# Patient Record
Sex: Male | Born: 1935 | Race: White | Hispanic: No | Marital: Married | Smoking: Never smoker
Health system: Southern US, Community
[De-identification: ages and names within clinical notes are randomized; demographics above are authoritative.]

## PROBLEM LIST (undated history)

## (undated) ENCOUNTER — Emergency Department (HOSPITAL_COMMUNITY): Discharge status: Against Medical Advice | Payer: Medicare Other

## (undated) DIAGNOSIS — E785 Hyperlipidemia, unspecified: Secondary | ICD-10-CM

## (undated) DIAGNOSIS — K219 Gastro-esophageal reflux disease without esophagitis: Secondary | ICD-10-CM

## (undated) DIAGNOSIS — E039 Hypothyroidism, unspecified: Secondary | ICD-10-CM

## (undated) DIAGNOSIS — I2510 Atherosclerotic heart disease of native coronary artery without angina pectoris: Secondary | ICD-10-CM

## (undated) DIAGNOSIS — M1990 Unspecified osteoarthritis, unspecified site: Secondary | ICD-10-CM

## (undated) DIAGNOSIS — I2119 ST elevation (STEMI) myocardial infarction involving other coronary artery of inferior wall: Secondary | ICD-10-CM

## (undated) DIAGNOSIS — J189 Pneumonia, unspecified organism: Secondary | ICD-10-CM

## (undated) HISTORY — DX: Hypothyroidism, unspecified: E03.9

## (undated) HISTORY — PX: CATARACT EXTRACTION W/ INTRAOCULAR LENS  IMPLANT, BILATERAL: SHX1307

## (undated) HISTORY — PX: TONSILLECTOMY: SUR1361

## (undated) HISTORY — DX: Gastro-esophageal reflux disease without esophagitis: K21.9

## (undated) HISTORY — DX: Atherosclerotic heart disease of native coronary artery without angina pectoris: I25.10

## (undated) HISTORY — DX: Hyperlipidemia, unspecified: E78.5

## (undated) HISTORY — DX: ST elevation (STEMI) myocardial infarction involving other coronary artery of inferior wall: I21.19

---

## 2003-07-24 ENCOUNTER — Ambulatory Visit (HOSPITAL_COMMUNITY): Admission: RE | Admit: 2003-07-24 | Discharge: 2003-07-24 | Payer: Self-pay | Admitting: Gastroenterology

## 2003-07-24 ENCOUNTER — Encounter (INDEPENDENT_AMBULATORY_CARE_PROVIDER_SITE_OTHER): Payer: Self-pay | Admitting: Specialist

## 2005-04-21 ENCOUNTER — Ambulatory Visit (HOSPITAL_COMMUNITY): Admission: RE | Admit: 2005-04-21 | Discharge: 2005-04-21 | Payer: Self-pay | Admitting: Gastroenterology

## 2005-04-21 ENCOUNTER — Encounter (INDEPENDENT_AMBULATORY_CARE_PROVIDER_SITE_OTHER): Payer: Self-pay | Admitting: *Deleted

## 2005-04-21 HISTORY — PX: COLONOSCOPY W/ BIOPSIES AND POLYPECTOMY: SHX1376

## 2008-04-30 DIAGNOSIS — I2119 ST elevation (STEMI) myocardial infarction involving other coronary artery of inferior wall: Secondary | ICD-10-CM

## 2008-04-30 HISTORY — DX: ST elevation (STEMI) myocardial infarction involving other coronary artery of inferior wall: I21.19

## 2008-05-07 ENCOUNTER — Inpatient Hospital Stay (HOSPITAL_COMMUNITY): Admission: EM | Admit: 2008-05-07 | Discharge: 2008-05-09 | Payer: Self-pay | Admitting: Cardiology

## 2008-05-07 HISTORY — PX: CORONARY ANGIOPLASTY WITH STENT PLACEMENT: SHX49

## 2008-05-08 ENCOUNTER — Encounter: Payer: Self-pay | Admitting: Cardiology

## 2008-05-24 ENCOUNTER — Encounter (HOSPITAL_COMMUNITY): Admission: RE | Admit: 2008-05-24 | Discharge: 2008-09-05 | Payer: Self-pay | Admitting: Internal Medicine

## 2008-09-10 ENCOUNTER — Encounter (HOSPITAL_COMMUNITY): Admission: RE | Admit: 2008-09-10 | Discharge: 2008-11-29 | Payer: Self-pay | Admitting: Cardiology

## 2008-11-30 ENCOUNTER — Encounter (HOSPITAL_COMMUNITY): Admission: RE | Admit: 2008-11-30 | Discharge: 2009-02-28 | Payer: Self-pay | Admitting: Cardiology

## 2009-03-01 ENCOUNTER — Encounter (HOSPITAL_COMMUNITY): Admission: RE | Admit: 2009-03-01 | Discharge: 2009-05-30 | Payer: Self-pay | Admitting: Cardiology

## 2009-05-31 ENCOUNTER — Encounter (HOSPITAL_COMMUNITY): Admission: RE | Admit: 2009-05-31 | Discharge: 2009-08-29 | Payer: Self-pay | Admitting: Cardiology

## 2009-08-30 ENCOUNTER — Encounter (HOSPITAL_COMMUNITY): Admission: RE | Admit: 2009-08-30 | Discharge: 2009-11-28 | Payer: Self-pay | Admitting: Cardiology

## 2009-11-30 ENCOUNTER — Encounter (HOSPITAL_COMMUNITY): Admission: RE | Admit: 2009-11-30 | Discharge: 2010-02-28 | Payer: Self-pay | Admitting: Cardiology

## 2010-01-20 DIAGNOSIS — M1990 Unspecified osteoarthritis, unspecified site: Secondary | ICD-10-CM | POA: Insufficient documentation

## 2010-01-20 DIAGNOSIS — D239 Other benign neoplasm of skin, unspecified: Secondary | ICD-10-CM | POA: Insufficient documentation

## 2010-01-20 DIAGNOSIS — I700 Atherosclerosis of aorta: Secondary | ICD-10-CM | POA: Insufficient documentation

## 2010-02-10 DIAGNOSIS — E785 Hyperlipidemia, unspecified: Secondary | ICD-10-CM | POA: Insufficient documentation

## 2010-02-10 DIAGNOSIS — D539 Nutritional anemia, unspecified: Secondary | ICD-10-CM | POA: Insufficient documentation

## 2010-02-10 DIAGNOSIS — R030 Elevated blood-pressure reading, without diagnosis of hypertension: Secondary | ICD-10-CM | POA: Insufficient documentation

## 2010-03-01 ENCOUNTER — Encounter (HOSPITAL_COMMUNITY): Admission: RE | Admit: 2010-03-01 | Discharge: 2010-05-30 | Payer: Self-pay | Admitting: Cardiology

## 2010-05-31 ENCOUNTER — Encounter (HOSPITAL_COMMUNITY): Admission: RE | Admit: 2010-05-31 | Discharge: 2010-08-29 | Payer: Self-pay | Admitting: Cardiology

## 2010-08-30 ENCOUNTER — Encounter (HOSPITAL_COMMUNITY)
Admission: RE | Admit: 2010-08-30 | Discharge: 2010-11-28 | Payer: Self-pay | Source: Home / Self Care | Attending: Cardiology | Admitting: Cardiology

## 2010-12-02 ENCOUNTER — Encounter (HOSPITAL_COMMUNITY)
Admission: RE | Admit: 2010-12-02 | Discharge: 2010-12-30 | Payer: Self-pay | Source: Home / Self Care | Attending: Cardiology | Admitting: Cardiology

## 2010-12-22 ENCOUNTER — Ambulatory Visit: Payer: Self-pay | Admitting: Cardiology

## 2010-12-31 ENCOUNTER — Ambulatory Visit (HOSPITAL_COMMUNITY): Payer: Self-pay

## 2011-01-02 ENCOUNTER — Encounter (HOSPITAL_COMMUNITY): Payer: Self-pay | Attending: Cardiology

## 2011-01-02 DIAGNOSIS — Z7982 Long term (current) use of aspirin: Secondary | ICD-10-CM | POA: Insufficient documentation

## 2011-01-02 DIAGNOSIS — Z5189 Encounter for other specified aftercare: Secondary | ICD-10-CM | POA: Insufficient documentation

## 2011-01-02 DIAGNOSIS — K219 Gastro-esophageal reflux disease without esophagitis: Secondary | ICD-10-CM | POA: Insufficient documentation

## 2011-01-02 DIAGNOSIS — I2510 Atherosclerotic heart disease of native coronary artery without angina pectoris: Secondary | ICD-10-CM | POA: Insufficient documentation

## 2011-01-02 DIAGNOSIS — E785 Hyperlipidemia, unspecified: Secondary | ICD-10-CM | POA: Insufficient documentation

## 2011-01-02 DIAGNOSIS — E039 Hypothyroidism, unspecified: Secondary | ICD-10-CM | POA: Insufficient documentation

## 2011-01-02 DIAGNOSIS — I252 Old myocardial infarction: Secondary | ICD-10-CM | POA: Insufficient documentation

## 2011-01-02 DIAGNOSIS — Z9861 Coronary angioplasty status: Secondary | ICD-10-CM | POA: Insufficient documentation

## 2011-01-02 DIAGNOSIS — I2582 Chronic total occlusion of coronary artery: Secondary | ICD-10-CM | POA: Insufficient documentation

## 2011-01-05 ENCOUNTER — Encounter (HOSPITAL_COMMUNITY): Payer: Self-pay

## 2011-01-07 ENCOUNTER — Encounter (HOSPITAL_COMMUNITY): Payer: Self-pay

## 2011-01-09 ENCOUNTER — Encounter (HOSPITAL_COMMUNITY): Payer: Self-pay

## 2011-01-12 ENCOUNTER — Encounter (HOSPITAL_COMMUNITY): Payer: Self-pay

## 2011-01-14 ENCOUNTER — Encounter (HOSPITAL_COMMUNITY): Payer: Self-pay

## 2011-01-16 ENCOUNTER — Encounter (HOSPITAL_COMMUNITY): Payer: Self-pay

## 2011-01-19 ENCOUNTER — Encounter (HOSPITAL_COMMUNITY): Payer: Self-pay

## 2011-01-21 ENCOUNTER — Encounter (HOSPITAL_COMMUNITY): Payer: Self-pay

## 2011-01-23 ENCOUNTER — Encounter (HOSPITAL_COMMUNITY): Payer: Self-pay

## 2011-01-26 ENCOUNTER — Encounter (HOSPITAL_COMMUNITY): Payer: Self-pay

## 2011-01-28 ENCOUNTER — Encounter (HOSPITAL_COMMUNITY): Payer: Self-pay

## 2011-01-30 ENCOUNTER — Encounter (HOSPITAL_COMMUNITY): Payer: Self-pay

## 2011-02-02 ENCOUNTER — Encounter (HOSPITAL_COMMUNITY): Payer: Self-pay | Attending: Cardiology

## 2011-02-02 DIAGNOSIS — I2582 Chronic total occlusion of coronary artery: Secondary | ICD-10-CM | POA: Insufficient documentation

## 2011-02-02 DIAGNOSIS — K219 Gastro-esophageal reflux disease without esophagitis: Secondary | ICD-10-CM | POA: Insufficient documentation

## 2011-02-02 DIAGNOSIS — Z9861 Coronary angioplasty status: Secondary | ICD-10-CM | POA: Insufficient documentation

## 2011-02-02 DIAGNOSIS — E039 Hypothyroidism, unspecified: Secondary | ICD-10-CM | POA: Insufficient documentation

## 2011-02-02 DIAGNOSIS — E785 Hyperlipidemia, unspecified: Secondary | ICD-10-CM | POA: Insufficient documentation

## 2011-02-02 DIAGNOSIS — I2510 Atherosclerotic heart disease of native coronary artery without angina pectoris: Secondary | ICD-10-CM | POA: Insufficient documentation

## 2011-02-02 DIAGNOSIS — Z7982 Long term (current) use of aspirin: Secondary | ICD-10-CM | POA: Insufficient documentation

## 2011-02-02 DIAGNOSIS — Z5189 Encounter for other specified aftercare: Secondary | ICD-10-CM | POA: Insufficient documentation

## 2011-02-02 DIAGNOSIS — I252 Old myocardial infarction: Secondary | ICD-10-CM | POA: Insufficient documentation

## 2011-02-04 ENCOUNTER — Encounter (HOSPITAL_COMMUNITY): Payer: Self-pay

## 2011-02-06 ENCOUNTER — Encounter (HOSPITAL_COMMUNITY): Payer: Self-pay

## 2011-02-09 ENCOUNTER — Encounter (HOSPITAL_COMMUNITY): Payer: Self-pay

## 2011-02-11 ENCOUNTER — Encounter (HOSPITAL_COMMUNITY): Payer: Self-pay

## 2011-02-13 ENCOUNTER — Encounter (HOSPITAL_COMMUNITY): Payer: Self-pay

## 2011-02-16 ENCOUNTER — Encounter (HOSPITAL_COMMUNITY): Payer: Self-pay

## 2011-02-18 ENCOUNTER — Encounter (HOSPITAL_COMMUNITY): Payer: Self-pay

## 2011-02-20 ENCOUNTER — Encounter (HOSPITAL_COMMUNITY): Payer: Self-pay

## 2011-02-23 ENCOUNTER — Encounter (HOSPITAL_COMMUNITY): Payer: Self-pay

## 2011-02-25 ENCOUNTER — Encounter (HOSPITAL_COMMUNITY): Payer: Self-pay

## 2011-02-27 ENCOUNTER — Encounter (HOSPITAL_COMMUNITY): Payer: Self-pay

## 2011-03-02 ENCOUNTER — Encounter (HOSPITAL_COMMUNITY): Payer: Self-pay | Attending: Cardiology

## 2011-03-02 DIAGNOSIS — K219 Gastro-esophageal reflux disease without esophagitis: Secondary | ICD-10-CM | POA: Insufficient documentation

## 2011-03-02 DIAGNOSIS — Z7982 Long term (current) use of aspirin: Secondary | ICD-10-CM | POA: Insufficient documentation

## 2011-03-02 DIAGNOSIS — E785 Hyperlipidemia, unspecified: Secondary | ICD-10-CM | POA: Insufficient documentation

## 2011-03-02 DIAGNOSIS — Z5189 Encounter for other specified aftercare: Secondary | ICD-10-CM | POA: Insufficient documentation

## 2011-03-02 DIAGNOSIS — I2510 Atherosclerotic heart disease of native coronary artery without angina pectoris: Secondary | ICD-10-CM | POA: Insufficient documentation

## 2011-03-02 DIAGNOSIS — Z9861 Coronary angioplasty status: Secondary | ICD-10-CM | POA: Insufficient documentation

## 2011-03-02 DIAGNOSIS — E039 Hypothyroidism, unspecified: Secondary | ICD-10-CM | POA: Insufficient documentation

## 2011-03-02 DIAGNOSIS — I2582 Chronic total occlusion of coronary artery: Secondary | ICD-10-CM | POA: Insufficient documentation

## 2011-03-02 DIAGNOSIS — I252 Old myocardial infarction: Secondary | ICD-10-CM | POA: Insufficient documentation

## 2011-03-04 ENCOUNTER — Encounter (HOSPITAL_COMMUNITY): Payer: Self-pay

## 2011-03-06 ENCOUNTER — Encounter (HOSPITAL_COMMUNITY): Payer: Self-pay

## 2011-03-09 ENCOUNTER — Encounter (HOSPITAL_COMMUNITY): Payer: Self-pay

## 2011-03-11 ENCOUNTER — Encounter (HOSPITAL_COMMUNITY): Payer: Self-pay

## 2011-03-13 ENCOUNTER — Encounter (HOSPITAL_COMMUNITY): Payer: Self-pay

## 2011-03-16 ENCOUNTER — Encounter (HOSPITAL_COMMUNITY): Payer: Self-pay

## 2011-03-18 ENCOUNTER — Encounter (HOSPITAL_COMMUNITY): Payer: Self-pay

## 2011-03-20 ENCOUNTER — Encounter (HOSPITAL_COMMUNITY): Payer: Self-pay

## 2011-03-23 ENCOUNTER — Encounter (HOSPITAL_COMMUNITY): Payer: Self-pay

## 2011-03-25 ENCOUNTER — Encounter (HOSPITAL_COMMUNITY): Payer: Self-pay

## 2011-03-27 ENCOUNTER — Encounter (HOSPITAL_COMMUNITY): Payer: Self-pay

## 2011-03-30 ENCOUNTER — Encounter (HOSPITAL_COMMUNITY): Payer: Self-pay

## 2011-04-01 ENCOUNTER — Encounter (HOSPITAL_COMMUNITY): Payer: Self-pay

## 2011-04-03 ENCOUNTER — Encounter (HOSPITAL_COMMUNITY): Payer: Self-pay

## 2011-04-06 ENCOUNTER — Encounter (HOSPITAL_COMMUNITY): Payer: Self-pay

## 2011-04-08 ENCOUNTER — Encounter (HOSPITAL_COMMUNITY): Payer: Self-pay | Attending: Cardiology

## 2011-04-08 DIAGNOSIS — I2510 Atherosclerotic heart disease of native coronary artery without angina pectoris: Secondary | ICD-10-CM | POA: Insufficient documentation

## 2011-04-08 DIAGNOSIS — I252 Old myocardial infarction: Secondary | ICD-10-CM | POA: Insufficient documentation

## 2011-04-08 DIAGNOSIS — Z9861 Coronary angioplasty status: Secondary | ICD-10-CM | POA: Insufficient documentation

## 2011-04-08 DIAGNOSIS — I2582 Chronic total occlusion of coronary artery: Secondary | ICD-10-CM | POA: Insufficient documentation

## 2011-04-08 DIAGNOSIS — Z5189 Encounter for other specified aftercare: Secondary | ICD-10-CM | POA: Insufficient documentation

## 2011-04-08 DIAGNOSIS — Z7982 Long term (current) use of aspirin: Secondary | ICD-10-CM | POA: Insufficient documentation

## 2011-04-08 DIAGNOSIS — E785 Hyperlipidemia, unspecified: Secondary | ICD-10-CM | POA: Insufficient documentation

## 2011-04-08 DIAGNOSIS — E039 Hypothyroidism, unspecified: Secondary | ICD-10-CM | POA: Insufficient documentation

## 2011-04-08 DIAGNOSIS — K219 Gastro-esophageal reflux disease without esophagitis: Secondary | ICD-10-CM | POA: Insufficient documentation

## 2011-04-10 ENCOUNTER — Encounter (HOSPITAL_COMMUNITY): Payer: Self-pay

## 2011-04-13 ENCOUNTER — Encounter (HOSPITAL_COMMUNITY): Payer: Self-pay

## 2011-04-14 NOTE — H&P (Signed)
NAME:  SAVON, BORDONARO NO.:  [id number]   MEDICAL RECORD NO.:  [id number]          PATIENT TYPE:  INP   LOCATION:  2901                         FACILITY:  [facility]   PHYSICIAN:  Peter M. Swaziland, M.D.  DATE OF BIRTH:  03-19-1930   DATE OF ADMISSION:  05/07/2008  DATE OF DISCHARGE:                              HISTORY & PHYSICAL   HISTORY OF PRESENT ILLNESS:  Mr. Dalto is a pleasant 77 year old  white male, previously healthy.  He did experience an episode of  significant chest pain and indigestion approximately 2 weeks ago.  This  was initially felt to be related to him taking Celebrex.  Celebrex was  discontinued and he was placed on Prilosec and had resolution of his  symptoms.  He subsequently did have an ECG by Dr. Timothy Lasso, which showed  new ST depression in leads V2 and V3 compared with an old ECG.  He  remained asymptomatic and was referred for a stress Cardiolite study  this morning.  On his stress test, the patient was able to walk for 7  minutes and 30 seconds on the Bruce protocol.  He had no chest pain, but  did complain of dyspnea.  During recovery, he dropped his blood pressure  to 110/40 and he subsequently developed acute ST-elevation in the  inferior leads, in leads II, III, aVF, and V6.  This did not respond to  sublingual nitroglycerin.  A code STEMI was called at this point and EMS  was called for emergency transportation.  Again, the patient still do  not have any chest pain and really felt quite well.  On subsequent  review of his resting Cardiolite images, he did have a moderate-sized  inferolateral wall defect.   PAST MEDICAL HISTORY:  1. Hypothyroidism.  2. GERD.   PAST SURGICAL HISTORY:  He has no prior surgeries.   ALLERGIES:  He has had no known allergies.   MEDICATIONS:  1. Synthroid 112 mcg per day.  2. Prilosec daily.   SOCIAL HISTORY:  The patient works in Statistician room.  He  is married, and has children and  grandchildren.  He denies tobacco or  alcohol use.   FAMILY HISTORY:  His parents had no history of heart disease, but he has  a son who has had 8 coronary stents.   REVIEW OF SYSTEMS:  Otherwise unremarkable.   PHYSICAL EXAMINATION:  GENERAL:  The patient is a well-developed white  male, in no distress.  VITAL SIGNS:  Blood pressure 113/71, pulse is 90 and regular.  His  respirations were normal.  HEENT:  Normocephalic and atraumatic.  Pupils equal, round, and reactive  to light and accommodation.  Extraocular movements are full.  Oropharynx  is clear.  NECK:  Without JVD, adenopathy, thyromegaly, or bruits.  LUNGS:  Clear.  CARDIAC:  Regular rate and rhythm without murmur, rub, gallop, or click.  ABDOMEN:  Soft and nontender without masses or hepatosplenomegaly.  Femoral pedal pulses are 2+ and symmetric.  He has no edema.  NEUROLOGIC:  Intact.   LABORATORY DATA:  ECG shows acute inferior wall injury pattern  with  reciprocal ST depression anteriorly.   IMPRESSION:  1. Acute inferior myocardial infarction.  2. Hypothyroidism.   PLAN:  The patient is treated in the office with 4 chewable aspirin and  sublingual nitroglycerin.  Started on oxygen therapy.  He will be  transferred directly to the cardiac catheterization laboratory for  emergent cardiac catheterization and possible PCI.           ______________________________  Peter M. Swaziland, M.D.     PMJ/MEDQ  D:  05/07/2008  T:  05/08/2008  Job:  [id number]   cc:   Gwen Pounds, MD

## 2011-04-14 NOTE — Cardiovascular Report (Signed)
NAME:  Joseph Roman, Joseph Roman NO.:  [id number]   MEDICAL RECORD NO.:  [id number]          PATIENT TYPE:  INP   LOCATION:  2901                         FACILITY:  [facility]   PHYSICIAN:  Peter M. Swaziland, M.D.  DATE OF BIRTH:  Jul 24, 1930   DATE OF PROCEDURE:  05/07/2008  DATE OF DISCHARGE:                            CARDIAC CATHETERIZATION   INDICATION FOR PROCEDURE:  The patient is a 76 year old white male who  had an episode of significant chest pain 2 weeks ago that has since  resolved.  He was referred for a stress Cardiolite study.  On stress  testing, the patient developed significant ST elevation in the inferior  leads that did not resolve with rest.  He had no symptoms of chest pain,  but did complain of some shortness of breath.  Because of his persistent  acute inferior injury pattern, he was brought to the cath lab for  emergent intervention.   PROCEDURE:  1. Left heart catheterization, coronary.  2. Left ventricular angiography accessed via the right femoral artery      using standard Seldinger technique.   EQUIPMENTS USED:  1. A 6-French 4-cm right and left Judkins catheter.  2. A 6-French pigtail catheter.  3. A 6-French CLS.  4. A 3.5 Runway guide.  5. A Miracle Brothers 4.5 wire.  6. Asahi medium wire.  7. A 2.5 x 15-mm apex balloon.  8. A 2.75 x 23-mm Palmaz stent.  9. A 2.75 x 20-mm Quantum Maverick balloon.   HEMODYNAMIC DATA:  Aortic pressure was 161 with a mean of 79 mmHg.  Left  ventricular pressure was 106 with EDP of 15 mmHg.   MEDICATIONS:  1. Heparin bolus of 3800 units with subsequent ACT of 247.  2. Integrilin double bolus of 180 mcg/kg IV followed by continuous      infusion of 2 mcg/kg per minute.  3. Nitroglycerin 200 mcg intracoronary x2.  4. Plavix 600 mg orally x1.   CONTRAST:  275 mL of Omnipaque.   ANGIOGRAPHIC DATA:  The left coronary artery arises and distributes  normally.  The left main coronary artery appears normal.   Left anterior descending artery has 30% stenosis proximally.  There is  diffuse 20% disease throughout the mid-LAD.  The first diagonal also has  20% disease.   The left circumflex coronary is totally occluded immediately after the  first obtuse marginal vessel.  There is TIMI grade 0 flow.   The right coronary artery arises and distributes normally.  It is a  dominant vessel.  It has scattered disease up to 20% throughout the  proximal midvessel.  No collaterals were seen to the circumflex artery.   We proceeded at this point with emergent intervention of the left  circumflex coronary artery.  Using the above equipment, we did find it  quite difficult to cross the total occlusion of the midcircumflex.  He  started with an Clinical cytogeneticist and also tried a Chief Technology Officer and a Ryder System, all without success.  We were able to finally cross with a Miracle  Brothers 4.5-mm wire.  We then predilated the  lesion using 2.5-mm apex  balloon up to 6 and then 8 atmospheres x2.  This yielded good  reperfusion and there was a segmental stenosis in the vessel.  TIMI  grade 3 flow was restored.  Because of difficulty visualizing the vessel  with the Miracle Brothers wire, we did exchange for an [employer]  wire.  We next proceeded with stenting of the lesion using 2.75 x 23-mm  Palmaz stent deploying this at 8 and then 10 atmospheres with the stent  balloon.  This did result in some compromise of the first obtuse  marginal vessel at the origin.  We were able to keep the medium wire  down the distal left circumflex and cross the obtuse marginal vessel  with the Miracle Brothers wire.  We performed 1 balloon inflation at the  ostium of this vessel using the 2.5-mm apex balloon up to 6 atmospheres.  This yielded good angiographic result of the obtuse marginal vessel.  We  then postdilated the stented segment using a 2.75 x 20-mm Quantum  Maverick balloon up to 14 atmospheres.  This yielded an  excellent  angiographic result with 0% stenosis in the mid-left circumflex coronary  artery, TIMI grade 3 flow.  There was still approximately 20-30%  narrowing in the ostium of the first obtuse marginal vessel that also  had excellent flow.   Left ventricular angiography was performed in the RAO view.  This  demonstrates normal left ventricular size.  There was a very tiny left  ventricular diverticulum on the inferior wall, but no significant wall  motion or abnormalities were seen in this view.  Ejection fraction was  estimated at 60%.   FINAL INTERPRETATION:  1. Single-vessel occlusive atherosclerotic coronary artery disease      involving the mid-left circumflex coronary artery.  2. Good left ventricular function.  3. Successful intracoronary stenting of the mid-left circumflex      coronary artery with a drug-eluting stent.           ______________________________  Peter M. Swaziland, M.D.     PMJ/MEDQ  D:  05/07/2008  T:  05/08/2008  Job:  [id number]   cc:   Othelia Pulling, M.D.

## 2011-04-14 NOTE — Discharge Summary (Signed)
NAME:  Joseph Roman, Joseph Roman NO.:  [id number]   MEDICAL RECORD NO.:  [id number]          PATIENT TYPE:  INP   LOCATION:  2001                         FACILITY:  [facility]   PHYSICIAN:  Peter M. Swaziland, M.D.  DATE OF BIRTH:  January 08, 1930   DATE OF ADMISSION:  05/07/2008  DATE OF DISCHARGE:  05/09/2008                               DISCHARGE SUMMARY   HISTORY OF PRESENT ILLNESS:  Joseph Roman is a pleasant 77 year old  white male with a history of hypothyroidism who presented for evaluation  after an episode of chest pain approximately 2 weeks prior, this was  initially felt to be related to taking Celebrex and this was  discontinued.  He was placed on Prilosec with resolution of his  symptoms.  Subsequent ECG showed new ST-segment depression in leads V2  and V3, and he was referred for stress Cardiolite study.  On his stress  test, the patient was able to walk 7 minutes and 30 seconds.  He denied  any chest pain, but did developed dyspnea.  During recovery, he  developed acute ST elevation in inferior leads; leads II, III, aVF and  V6 consistent with acute injury, these ST-segment changes persisted, and  the patient did become mildly hypotensive.  His ST-segment elevation did  not resolve despite sublingual nitroglycerin and he was admitted  emergently for acute intervention.  For details of his past medical  history, social history, family history, and physical exam please see  admission history and physical.   LABORATORY DATA:  As noted on admission, his ECG showed ST-segment  depression in leads V2 and V3.  There was new ST-segment elevation in  the inferior leads; leads II, III, aVF, and V6.   ADDITIONAL LABORATORY DATA:  His hemoglobin is 15.1, hematocrit 42.9,  white count is 11,600, platelets 329,000, sodium 136, potassium 3.9,  chloride 103, CO2 22, BUN 21, creatinine 1.13, and glucose 128.  Coags  were normal.  Liver function studies were normal except for total  bili  of 1.3.  Albumin is 3.2.  Serial cardiac enzymes showed troponins of 5.9  and 13.57 and 8.9 and then 7.61.  CPK-MBs were 101 with 4.5 MB, then 123  with 5.1 MB, then 113 with 5.1 MB, and then 93 with 3.7 MB.  Cholesterol  was 102, LDL was 67, HDL 21, triglycerides 71, calcium was 8.5, TSH was  2.084, and A1c was 5.6%.  Chest x-ray showed no active disease with  minimal basilar atelectasis.   HOSPITAL COURSE:  The patient was taken directly to the cardiac  catheterization laboratory.  He underwent emergent cardiac  catheterization, this demonstrated mild nonobstructive disease in the  LAD and right coronary distribution.  The left circumflex coronary was  occluded after the first obtuse marginal vessel.  There were no  significant collaterals to this vessel.  Left ventricular function,  overall, appeared normal with trivial inferior wall diverticulum.  We  proceeded at this point with acute intervention of the mid circumflex.  The patient was heparinized and placed on IV Integrilin. He received 600  mg of Plavix p.o.  The lesion is  circumflex, it is actually quite  difficult to cross, but we were able to cross the lesion with a Miracle  Brothers wire and this lesion was successfully stented using a 2.75 x 23  mm PROMUS drug-eluting stent.  We also dilated the first obtuse marginal  vessel with a balloon at the ostium, as there was some plaque shift into  the margin of the obtuse marginal vessel.  This yielded an excellent  angiographic result with 0% residual stenosis and TIMI grade 3 flow.  Following the procedure, the patient's ST segments normalized.  His  cardiac enzymes were elevated as noted.  We did reviewed his resting  Cardiolite images which showed a inferolateral wall defect and given the  time course of events, it was felt that the patient probably had a  subtotal occlusion of the circumflex 2 weeks prior, and then completed  his infarct the day of admission, the patient  did well.   FOLLOWUP PROCEDURE:  He was kept on IV Integrilin for 18 hours.  He had  no recurrent chest pain.  He had no groin complications.  His ECG  normalized.  He was progressively ambulated.  He was started on low-dose  Toprol-XL as well as statin therapy.  He was seen by cardiac rehab and  progressively ambulated without difficulty, and was discharged home in  stable condition on May 09, 2008.   DISCHARGE DIAGNOSES:  1. Acute inferolateral myocardial infarction.  2. Coronary artery disease, status post successful stenting of the mid      circumflex coronary artery.  3. Hypothyroidism.  4. Mild dyslipidemia.   DISCHARGE MEDICATIONS:  1. Coated aspirin 325 mg per day.  2. Plavix 75 mg per day.  3. Toprol-XL 25 mg per day.  4. Crestor 10 mg per day.  5. Prilosec 20 mg per day.  6. Synthroid 112 mcg daily.  7. Nitroglycerin sublingual p.r.n.   The patient will follow up with Dr. Swaziland in 2 weeks.  Discharge status  is improved.           ______________________________  Peter M. Swaziland, M.D.     PMJ/MEDQ  D:  05/09/2008  T:  05/09/2008  Job:  [id number]   cc:   Joseph Pounds, MD

## 2011-04-15 ENCOUNTER — Encounter (HOSPITAL_COMMUNITY): Payer: Self-pay

## 2011-04-17 ENCOUNTER — Encounter (HOSPITAL_COMMUNITY): Payer: Self-pay

## 2011-04-17 NOTE — Op Note (Signed)
NAME:  Joseph Roman, Joseph Roman NO.:  [id number]   MEDICAL RECORD NO.:  [id number]          PATIENT TYPE:  AMB   LOCATION:  ENDO                         FACILITY:  [facility]   PHYSICIAN:  Petra Kuba, M.D.    DATE OF BIRTH:  Dec 17, 1929   DATE OF PROCEDURE:  04/21/2005  DATE OF DISCHARGE:                                 OPERATIVE REPORT   PROCEDURE:  Colonoscopy with polypectomy.   INDICATIONS FOR PROCEDURE:  History of colon polyps due for repeat  screening.   Consent was signed after risks, benefits, methods, and options were  thoroughly discussed in the office in the past.   MEDICATIONS:  Demerol 60, Versed 6.   DESCRIPTION OF PROCEDURE:  Rectal inspection was pertinent for small  external hemorrhoids, digital exam was negative. The video colonoscope was  inserted and much easier than last time advanced to the level of the  ileocecal valve. At that point, the scope began to loop and with abdominal  pressure was able to advance to the cecal pole which was identified by the  appendiceal orifice and the ileocecal valve. On insertion, some left sided  scattered diverticula were seen but no other abnormalities. The prep was  adequate. There was minimal stool that required washing and suctioning. We  did wash and suction the ascending colon very well but other than a tiny  questionable polyp on a fold in the hepatic flexure which was hot biopsied,  no other abnormal folds or polyps were seen. The main reason for doing this  colonoscopy was to reevaluate this fold which had an adenomatous biopsy on  the previous colonoscopy. The scope was then slowly withdrawn. No additional  polypoid lesions were seen as we slowly withdrew back to the rectum. There  was a left occasional diverticula mentioned above. Once back in the rectum,  anal rectal pullthrough and retroflexion confirmed some small hemorrhoids.  The scope was straightened and readvanced a short ways up the left side of  the colon, air was suctioned, scope removed. The patient tolerated the  procedure well. There was no obvious or immediate complication.   ENDOSCOPIC DIAGNOSIS:  1.  Internal and external hemorrhoids.  2.  Left occasional diverticula.  3.  Questionable tiny hepatic flexure polyp hot biopsied.  4.  Otherwise within normal limits to the cecum.   PLAN:  Await pathology, probably recheck colon screening in five years if  doing well medically otherwise return care to Dr. Timothy Lasso for the customary  health care maintenance to include yearly rectal's and guaiac's.      MEM/MEDQ  D:  04/21/2005  T:  04/21/2005  Job:  [id number]   cc:   Gwen Pounds, MD  Fax: [phone]

## 2011-04-17 NOTE — Op Note (Signed)
NAME:  Joseph Roman, Joseph Roman                      ACCOUNT NO.:  [id number]   MEDICAL RECORD NO.:  [id number]                   PATIENT TYPE:  AMB   LOCATION:  ENDO                                 [facility]AC[facility]   PHYSICIAN:  Petra Kuba, M.D.                 DATE OF BIRTH:  May 25, 1930   DATE OF PROCEDURE:  07/24/2003  DATE OF DISCHARGE:                                 OPERATIVE REPORT   PROCEDURE:  Colonoscopy with hot biopsy.   INDICATIONS FOR PROCEDURE:  Screening.   Consent was signed after risks, benefits, methods, and options were  thoroughly discussed in the office.   MEDICINES USED:  Demerol 50, Versed 5.   DESCRIPTION OF PROCEDURE:  Rectal inspection was pertinent for external  hemorrhoids, small. Digital exam was negative. The video colonoscope was  inserted and with some difficulty due to a long looping colon using the  regular scope with rolling him on his back and some abdominal pressure was  able to advance to the cecum. Other than some occasional left sided  diverticula, no abnormalities were seen on insertion.  The cecum was  identified by the appendiceal orifice and the ileocecal valve. The prep was  adequate. There was some liquid stool that required washing and suctioning.  On slow withdrawal through the colon in the mid ascending along a fold was a  questionable polyp which was hot biopsied x3 and put in the first container.  Two other questionable polyps were seen in the mid transverse and mid  descending which were hot biopsied and put in the same container. Other than  the occasional left sided diverticula, no additional findings were seen as  we slowly withdrew back to the rectum. Anal rectal pull through and  retroflexion confirmed some more hemorrhoids. The scope was reinserted a  short ways up the left side of the colon, air was suctioned scope removed.  The patient tolerated the procedure well. There was no obvious or immediate  complications.   ENDOSCOPIC DIAGNOSIS:  1. Internal and external hemorrhoids.  2. Tortous long looping colon.  3. Left occasional diverticula.  4. Two tiny to small questionable polyps hot biopsied in the descending and     transverse.  5. Abnormal fold in the ascending. Questionable polypoid hot biopsied.  6. Otherwise within normal limits to the cecum.    PLAN:  Await pathology to determine future colonic screening. Happy to see  back p.r.n. otherwise return care to Dr. Timothy Lasso for the customary health care  maintenance to include yearly rectals and guaiacs.                                               Petra Kuba, M.D.    MEM/MEDQ  D:  07/24/2003  T:  07/24/2003  Job:  [id number]   cc:   Gwen Pounds, M.D.  [address]  [place]  [place] [address]  Fax: [phone]

## 2011-04-20 ENCOUNTER — Encounter (HOSPITAL_COMMUNITY): Payer: Self-pay

## 2011-04-22 ENCOUNTER — Encounter (HOSPITAL_COMMUNITY): Payer: Self-pay

## 2011-04-24 ENCOUNTER — Encounter (HOSPITAL_COMMUNITY): Payer: Self-pay

## 2011-04-27 ENCOUNTER — Encounter (HOSPITAL_COMMUNITY): Payer: Self-pay

## 2011-04-29 ENCOUNTER — Encounter (HOSPITAL_COMMUNITY): Payer: Self-pay

## 2011-05-01 ENCOUNTER — Encounter (HOSPITAL_COMMUNITY): Payer: Self-pay | Attending: Cardiology

## 2011-05-01 DIAGNOSIS — E785 Hyperlipidemia, unspecified: Secondary | ICD-10-CM | POA: Insufficient documentation

## 2011-05-01 DIAGNOSIS — Z5189 Encounter for other specified aftercare: Secondary | ICD-10-CM | POA: Insufficient documentation

## 2011-05-01 DIAGNOSIS — K219 Gastro-esophageal reflux disease without esophagitis: Secondary | ICD-10-CM | POA: Insufficient documentation

## 2011-05-01 DIAGNOSIS — I2510 Atherosclerotic heart disease of native coronary artery without angina pectoris: Secondary | ICD-10-CM | POA: Insufficient documentation

## 2011-05-01 DIAGNOSIS — Z9861 Coronary angioplasty status: Secondary | ICD-10-CM | POA: Insufficient documentation

## 2011-05-01 DIAGNOSIS — I2582 Chronic total occlusion of coronary artery: Secondary | ICD-10-CM | POA: Insufficient documentation

## 2011-05-01 DIAGNOSIS — Z7982 Long term (current) use of aspirin: Secondary | ICD-10-CM | POA: Insufficient documentation

## 2011-05-01 DIAGNOSIS — E039 Hypothyroidism, unspecified: Secondary | ICD-10-CM | POA: Insufficient documentation

## 2011-05-01 DIAGNOSIS — I252 Old myocardial infarction: Secondary | ICD-10-CM | POA: Insufficient documentation

## 2011-05-04 ENCOUNTER — Encounter (HOSPITAL_COMMUNITY): Payer: Self-pay

## 2011-05-04 ENCOUNTER — Other Ambulatory Visit: Payer: Self-pay | Admitting: *Deleted

## 2011-05-04 MED ORDER — CLOPIDOGREL BISULFATE 75 MG PO TABS: 75.0000 mg | ORAL_TABLET | Freq: Every day | ORAL | Status: DC

## 2011-05-04 NOTE — Telephone Encounter (Signed)
escribe medication per fax request  

## 2011-05-06 ENCOUNTER — Encounter (HOSPITAL_COMMUNITY): Payer: Self-pay

## 2011-05-08 ENCOUNTER — Encounter (HOSPITAL_COMMUNITY): Payer: Self-pay

## 2011-05-11 ENCOUNTER — Encounter (HOSPITAL_COMMUNITY): Payer: Self-pay

## 2011-05-13 ENCOUNTER — Encounter (HOSPITAL_COMMUNITY): Payer: Self-pay

## 2011-05-14 ENCOUNTER — Other Ambulatory Visit: Payer: Self-pay | Admitting: *Deleted

## 2011-05-14 DIAGNOSIS — E7800 Pure hypercholesterolemia, unspecified: Secondary | ICD-10-CM

## 2011-05-14 MED ORDER — ROSUVASTATIN CALCIUM 10 MG PO TABS: 10.0000 mg | ORAL_TABLET | Freq: Every day | ORAL | Status: DC

## 2011-05-14 NOTE — Telephone Encounter (Signed)
Faxed refill for crestor to [facility]

## 2011-05-15 ENCOUNTER — Encounter (HOSPITAL_COMMUNITY): Payer: Self-pay

## 2011-05-18 ENCOUNTER — Encounter (HOSPITAL_COMMUNITY): Payer: Self-pay

## 2011-05-20 ENCOUNTER — Encounter (HOSPITAL_COMMUNITY): Payer: Self-pay

## 2011-05-22 ENCOUNTER — Encounter (HOSPITAL_COMMUNITY): Payer: Self-pay

## 2011-05-25 ENCOUNTER — Encounter (HOSPITAL_COMMUNITY): Payer: Self-pay

## 2011-05-27 ENCOUNTER — Encounter (HOSPITAL_COMMUNITY): Payer: Self-pay

## 2011-05-29 ENCOUNTER — Encounter (HOSPITAL_COMMUNITY): Payer: Self-pay

## 2011-06-01 ENCOUNTER — Encounter (HOSPITAL_COMMUNITY): Payer: Self-pay | Attending: Cardiology

## 2011-06-01 DIAGNOSIS — Z9861 Coronary angioplasty status: Secondary | ICD-10-CM | POA: Insufficient documentation

## 2011-06-01 DIAGNOSIS — Z5189 Encounter for other specified aftercare: Secondary | ICD-10-CM | POA: Insufficient documentation

## 2011-06-01 DIAGNOSIS — Z7982 Long term (current) use of aspirin: Secondary | ICD-10-CM | POA: Insufficient documentation

## 2011-06-01 DIAGNOSIS — E785 Hyperlipidemia, unspecified: Secondary | ICD-10-CM | POA: Insufficient documentation

## 2011-06-01 DIAGNOSIS — I2582 Chronic total occlusion of coronary artery: Secondary | ICD-10-CM | POA: Insufficient documentation

## 2011-06-01 DIAGNOSIS — I252 Old myocardial infarction: Secondary | ICD-10-CM | POA: Insufficient documentation

## 2011-06-01 DIAGNOSIS — K219 Gastro-esophageal reflux disease without esophagitis: Secondary | ICD-10-CM | POA: Insufficient documentation

## 2011-06-01 DIAGNOSIS — I2510 Atherosclerotic heart disease of native coronary artery without angina pectoris: Secondary | ICD-10-CM | POA: Insufficient documentation

## 2011-06-01 DIAGNOSIS — E039 Hypothyroidism, unspecified: Secondary | ICD-10-CM | POA: Insufficient documentation

## 2011-06-03 ENCOUNTER — Encounter (HOSPITAL_COMMUNITY): Payer: Self-pay

## 2011-06-05 ENCOUNTER — Encounter (HOSPITAL_COMMUNITY): Payer: Self-pay

## 2011-06-08 ENCOUNTER — Encounter (HOSPITAL_COMMUNITY): Payer: Self-pay

## 2011-06-10 ENCOUNTER — Encounter (HOSPITAL_COMMUNITY): Payer: Self-pay

## 2011-06-12 ENCOUNTER — Encounter (HOSPITAL_COMMUNITY): Payer: Self-pay

## 2011-06-15 ENCOUNTER — Encounter (HOSPITAL_COMMUNITY): Payer: Self-pay

## 2011-06-17 ENCOUNTER — Encounter (HOSPITAL_COMMUNITY): Payer: Self-pay

## 2011-06-19 ENCOUNTER — Encounter (HOSPITAL_COMMUNITY): Payer: Self-pay

## 2011-06-22 ENCOUNTER — Encounter (HOSPITAL_COMMUNITY): Payer: Self-pay

## 2011-06-24 ENCOUNTER — Encounter (HOSPITAL_COMMUNITY): Payer: Self-pay

## 2011-06-26 ENCOUNTER — Encounter (HOSPITAL_COMMUNITY): Payer: Self-pay

## 2011-06-29 ENCOUNTER — Encounter (HOSPITAL_COMMUNITY): Payer: Self-pay

## 2011-07-01 ENCOUNTER — Encounter (HOSPITAL_COMMUNITY): Payer: Self-pay | Attending: Cardiology

## 2011-07-01 DIAGNOSIS — K219 Gastro-esophageal reflux disease without esophagitis: Secondary | ICD-10-CM | POA: Insufficient documentation

## 2011-07-01 DIAGNOSIS — Z5189 Encounter for other specified aftercare: Secondary | ICD-10-CM | POA: Insufficient documentation

## 2011-07-01 DIAGNOSIS — I2582 Chronic total occlusion of coronary artery: Secondary | ICD-10-CM | POA: Insufficient documentation

## 2011-07-01 DIAGNOSIS — E039 Hypothyroidism, unspecified: Secondary | ICD-10-CM | POA: Insufficient documentation

## 2011-07-01 DIAGNOSIS — I252 Old myocardial infarction: Secondary | ICD-10-CM | POA: Insufficient documentation

## 2011-07-01 DIAGNOSIS — I2510 Atherosclerotic heart disease of native coronary artery without angina pectoris: Secondary | ICD-10-CM | POA: Insufficient documentation

## 2011-07-01 DIAGNOSIS — Z9861 Coronary angioplasty status: Secondary | ICD-10-CM | POA: Insufficient documentation

## 2011-07-01 DIAGNOSIS — E785 Hyperlipidemia, unspecified: Secondary | ICD-10-CM | POA: Insufficient documentation

## 2011-07-01 DIAGNOSIS — Z7982 Long term (current) use of aspirin: Secondary | ICD-10-CM | POA: Insufficient documentation

## 2011-07-03 ENCOUNTER — Encounter (HOSPITAL_COMMUNITY): Payer: Self-pay

## 2011-07-06 ENCOUNTER — Encounter (HOSPITAL_COMMUNITY): Payer: Self-pay

## 2011-07-08 ENCOUNTER — Encounter (HOSPITAL_COMMUNITY): Payer: Self-pay

## 2011-07-10 ENCOUNTER — Encounter (HOSPITAL_COMMUNITY): Payer: Self-pay

## 2011-07-13 ENCOUNTER — Encounter (HOSPITAL_COMMUNITY): Payer: Self-pay

## 2011-07-15 ENCOUNTER — Encounter (HOSPITAL_COMMUNITY): Payer: Self-pay

## 2011-07-17 ENCOUNTER — Encounter (HOSPITAL_COMMUNITY): Payer: Self-pay

## 2011-07-20 ENCOUNTER — Encounter (HOSPITAL_COMMUNITY): Payer: Self-pay

## 2011-07-22 ENCOUNTER — Encounter (HOSPITAL_COMMUNITY): Payer: Self-pay

## 2011-07-24 ENCOUNTER — Encounter (HOSPITAL_COMMUNITY): Payer: Self-pay

## 2011-07-27 ENCOUNTER — Encounter (HOSPITAL_COMMUNITY): Payer: Self-pay

## 2011-07-29 ENCOUNTER — Encounter (HOSPITAL_COMMUNITY): Payer: Self-pay

## 2011-07-31 ENCOUNTER — Encounter (HOSPITAL_COMMUNITY): Payer: Self-pay

## 2011-08-03 ENCOUNTER — Encounter (HOSPITAL_COMMUNITY): Payer: Self-pay | Attending: Cardiology

## 2011-08-03 DIAGNOSIS — I2510 Atherosclerotic heart disease of native coronary artery without angina pectoris: Secondary | ICD-10-CM | POA: Insufficient documentation

## 2011-08-03 DIAGNOSIS — I252 Old myocardial infarction: Secondary | ICD-10-CM | POA: Insufficient documentation

## 2011-08-03 DIAGNOSIS — I2582 Chronic total occlusion of coronary artery: Secondary | ICD-10-CM | POA: Insufficient documentation

## 2011-08-03 DIAGNOSIS — E039 Hypothyroidism, unspecified: Secondary | ICD-10-CM | POA: Insufficient documentation

## 2011-08-03 DIAGNOSIS — Z9861 Coronary angioplasty status: Secondary | ICD-10-CM | POA: Insufficient documentation

## 2011-08-03 DIAGNOSIS — E785 Hyperlipidemia, unspecified: Secondary | ICD-10-CM | POA: Insufficient documentation

## 2011-08-03 DIAGNOSIS — Z5189 Encounter for other specified aftercare: Secondary | ICD-10-CM | POA: Insufficient documentation

## 2011-08-03 DIAGNOSIS — K219 Gastro-esophageal reflux disease without esophagitis: Secondary | ICD-10-CM | POA: Insufficient documentation

## 2011-08-03 DIAGNOSIS — Z7982 Long term (current) use of aspirin: Secondary | ICD-10-CM | POA: Insufficient documentation

## 2011-08-05 ENCOUNTER — Encounter (HOSPITAL_COMMUNITY): Payer: Self-pay

## 2011-08-07 ENCOUNTER — Encounter (HOSPITAL_COMMUNITY): Payer: Self-pay

## 2011-08-10 ENCOUNTER — Encounter (HOSPITAL_COMMUNITY): Payer: Self-pay

## 2011-08-12 ENCOUNTER — Encounter (HOSPITAL_COMMUNITY): Payer: Self-pay

## 2011-08-14 ENCOUNTER — Encounter (HOSPITAL_COMMUNITY): Payer: Self-pay

## 2011-08-17 ENCOUNTER — Encounter (HOSPITAL_COMMUNITY): Payer: Self-pay

## 2011-08-19 ENCOUNTER — Encounter (HOSPITAL_COMMUNITY): Payer: Self-pay

## 2011-08-21 ENCOUNTER — Encounter (HOSPITAL_COMMUNITY): Payer: Self-pay

## 2011-08-24 ENCOUNTER — Encounter (HOSPITAL_COMMUNITY): Payer: Self-pay

## 2011-08-26 ENCOUNTER — Encounter (HOSPITAL_COMMUNITY): Payer: Self-pay

## 2011-08-27 LAB — DIFFERENTIAL
Eosinophils Absolute: 0
Lymphs Abs: 1.5
Monocytes Absolute: 0.9
Monocytes Relative: 8
Neutrophils Relative %: 79 — ABNORMAL HIGH

## 2011-08-27 LAB — CBC
Hemoglobin: 15.1
MCHC: 35.1
Platelets: 329
Platelets: 329
RDW: 12.8
RDW: 13

## 2011-08-27 LAB — APTT: aPTT: 29

## 2011-08-27 LAB — CARDIAC PANEL(CRET KIN+CKTOT+MB+TROPI)
CK, MB: 3.7
CK, MB: 4.5 — ABNORMAL HIGH
CK, MB: 5.1 — ABNORMAL HIGH
CK, MB: 5.1 — ABNORMAL HIGH
Relative Index: 4.5 — ABNORMAL HIGH
Total CK: 101
Total CK: 123
Total CK: 93
Troponin I: 5.9
Troponin I: 7.61

## 2011-08-27 LAB — POCT I-STAT, CHEM 8
Chloride: 103
HCT: 45
Hemoglobin: 15.3
Potassium: 3.9
Sodium: 136

## 2011-08-27 LAB — COMPREHENSIVE METABOLIC PANEL
ALT: 29
Albumin: 3.2 — ABNORMAL LOW
Calcium: 8.5
GFR calc Af Amer: >60
Glucose, Bld: 128 — ABNORMAL HIGH
Sodium: 136
Total Protein: 6.8

## 2011-08-27 LAB — POCT I-STAT 3, ART BLOOD GAS (G3+)
Acid-base deficit: 1
O2 Saturation: 99
Operator id: 282671

## 2011-08-27 LAB — LIPID PANEL
LDL Cholesterol: 67
Triglycerides: 71

## 2011-08-27 LAB — PROTIME-INR: INR: 1.1

## 2011-08-27 LAB — BASIC METABOLIC PANEL
GFR calc non Af Amer: >60
Potassium: 4.5
Sodium: 137

## 2011-08-27 LAB — HEMOGLOBIN A1C: Mean Plasma Glucose: 122

## 2011-08-28 ENCOUNTER — Encounter (HOSPITAL_COMMUNITY): Payer: Self-pay

## 2011-08-31 ENCOUNTER — Encounter (HOSPITAL_COMMUNITY): Payer: Self-pay | Attending: Cardiology

## 2011-08-31 DIAGNOSIS — I252 Old myocardial infarction: Secondary | ICD-10-CM | POA: Insufficient documentation

## 2011-08-31 DIAGNOSIS — K219 Gastro-esophageal reflux disease without esophagitis: Secondary | ICD-10-CM | POA: Insufficient documentation

## 2011-08-31 DIAGNOSIS — Z7982 Long term (current) use of aspirin: Secondary | ICD-10-CM | POA: Insufficient documentation

## 2011-08-31 DIAGNOSIS — I2582 Chronic total occlusion of coronary artery: Secondary | ICD-10-CM | POA: Insufficient documentation

## 2011-08-31 DIAGNOSIS — Z5189 Encounter for other specified aftercare: Secondary | ICD-10-CM | POA: Insufficient documentation

## 2011-08-31 DIAGNOSIS — Z9861 Coronary angioplasty status: Secondary | ICD-10-CM | POA: Insufficient documentation

## 2011-08-31 DIAGNOSIS — I2510 Atherosclerotic heart disease of native coronary artery without angina pectoris: Secondary | ICD-10-CM | POA: Insufficient documentation

## 2011-08-31 DIAGNOSIS — E785 Hyperlipidemia, unspecified: Secondary | ICD-10-CM | POA: Insufficient documentation

## 2011-08-31 DIAGNOSIS — E039 Hypothyroidism, unspecified: Secondary | ICD-10-CM | POA: Insufficient documentation

## 2011-09-02 ENCOUNTER — Encounter (HOSPITAL_COMMUNITY): Payer: Self-pay

## 2011-09-04 ENCOUNTER — Encounter (HOSPITAL_COMMUNITY): Payer: Self-pay

## 2011-09-07 ENCOUNTER — Encounter (HOSPITAL_COMMUNITY): Payer: Self-pay

## 2011-09-09 ENCOUNTER — Encounter (HOSPITAL_COMMUNITY): Payer: Self-pay

## 2011-09-11 ENCOUNTER — Encounter (HOSPITAL_COMMUNITY): Payer: Self-pay

## 2011-09-14 ENCOUNTER — Encounter (HOSPITAL_COMMUNITY): Payer: Self-pay

## 2011-09-16 ENCOUNTER — Encounter (HOSPITAL_COMMUNITY): Payer: Self-pay

## 2011-09-18 ENCOUNTER — Encounter (HOSPITAL_COMMUNITY): Payer: Self-pay

## 2011-09-21 ENCOUNTER — Encounter (HOSPITAL_COMMUNITY): Payer: Self-pay

## 2011-09-23 ENCOUNTER — Encounter (HOSPITAL_COMMUNITY): Payer: Self-pay

## 2011-09-25 ENCOUNTER — Encounter (HOSPITAL_COMMUNITY): Payer: Self-pay

## 2011-09-25 ENCOUNTER — Other Ambulatory Visit: Payer: Self-pay | Admitting: Cardiology

## 2011-09-25 NOTE — Telephone Encounter (Signed)
Pt wants to get refill nitroglycerin tablets called to [facility] on [address]  Please call when done

## 2011-09-28 ENCOUNTER — Encounter (HOSPITAL_COMMUNITY): Payer: Self-pay

## 2011-09-28 MED ORDER — NITROGLYCERIN 0.4 MG SL SUBL: 0.4000 mg | SUBLINGUAL_TABLET | SUBLINGUAL | Status: DC | PRN

## 2011-09-28 NOTE — Telephone Encounter (Signed)
REFILLED NITRO BECAUSE HIS PRESCRIPTION HAS EXPIRED

## 2011-09-30 ENCOUNTER — Encounter (HOSPITAL_COMMUNITY): Payer: Self-pay

## 2011-10-02 ENCOUNTER — Encounter (HOSPITAL_COMMUNITY): Payer: Self-pay

## 2011-10-02 DIAGNOSIS — I2582 Chronic total occlusion of coronary artery: Secondary | ICD-10-CM | POA: Insufficient documentation

## 2011-10-02 DIAGNOSIS — E039 Hypothyroidism, unspecified: Secondary | ICD-10-CM | POA: Insufficient documentation

## 2011-10-02 DIAGNOSIS — Z9861 Coronary angioplasty status: Secondary | ICD-10-CM | POA: Insufficient documentation

## 2011-10-02 DIAGNOSIS — Z5189 Encounter for other specified aftercare: Secondary | ICD-10-CM | POA: Insufficient documentation

## 2011-10-02 DIAGNOSIS — E785 Hyperlipidemia, unspecified: Secondary | ICD-10-CM | POA: Insufficient documentation

## 2011-10-02 DIAGNOSIS — I252 Old myocardial infarction: Secondary | ICD-10-CM | POA: Insufficient documentation

## 2011-10-02 DIAGNOSIS — K219 Gastro-esophageal reflux disease without esophagitis: Secondary | ICD-10-CM | POA: Insufficient documentation

## 2011-10-02 DIAGNOSIS — I2510 Atherosclerotic heart disease of native coronary artery without angina pectoris: Secondary | ICD-10-CM | POA: Insufficient documentation

## 2011-10-02 DIAGNOSIS — Z7982 Long term (current) use of aspirin: Secondary | ICD-10-CM | POA: Insufficient documentation

## 2011-10-05 ENCOUNTER — Encounter (HOSPITAL_COMMUNITY): Payer: Self-pay

## 2011-10-07 ENCOUNTER — Encounter (HOSPITAL_COMMUNITY): Payer: Self-pay

## 2011-10-09 ENCOUNTER — Encounter (HOSPITAL_COMMUNITY): Payer: Self-pay

## 2011-10-12 ENCOUNTER — Encounter (HOSPITAL_COMMUNITY): Payer: Self-pay

## 2011-10-14 ENCOUNTER — Encounter (HOSPITAL_COMMUNITY): Payer: Self-pay

## 2011-10-16 ENCOUNTER — Encounter (HOSPITAL_COMMUNITY): Payer: Self-pay

## 2011-10-19 ENCOUNTER — Encounter (HOSPITAL_COMMUNITY)
Admission: RE | Admit: 2011-10-19 | Discharge: 2011-10-19 | Disposition: A | Discharge status: Home / Self Care | Payer: Self-pay | Source: Ambulatory Visit | Attending: Cardiology | Admitting: Cardiology

## 2011-10-21 ENCOUNTER — Encounter (HOSPITAL_COMMUNITY): Payer: Self-pay

## 2011-10-23 ENCOUNTER — Encounter (HOSPITAL_COMMUNITY): Payer: Self-pay

## 2011-10-26 ENCOUNTER — Encounter (HOSPITAL_COMMUNITY)
Admission: RE | Admit: 2011-10-26 | Discharge: 2011-10-26 | Disposition: A | Discharge status: Home / Self Care | Payer: Self-pay | Source: Ambulatory Visit | Attending: Cardiology | Admitting: Cardiology

## 2011-10-28 ENCOUNTER — Encounter (HOSPITAL_COMMUNITY): Payer: Self-pay

## 2011-10-30 ENCOUNTER — Encounter (HOSPITAL_COMMUNITY)
Admission: RE | Admit: 2011-10-30 | Discharge: 2011-10-30 | Disposition: A | Discharge status: Home / Self Care | Payer: Self-pay | Source: Ambulatory Visit | Attending: Cardiology | Admitting: Cardiology

## 2011-11-02 ENCOUNTER — Encounter (HOSPITAL_COMMUNITY): Payer: Self-pay

## 2011-11-02 DIAGNOSIS — K219 Gastro-esophageal reflux disease without esophagitis: Secondary | ICD-10-CM | POA: Insufficient documentation

## 2011-11-02 DIAGNOSIS — I2510 Atherosclerotic heart disease of native coronary artery without angina pectoris: Secondary | ICD-10-CM | POA: Insufficient documentation

## 2011-11-02 DIAGNOSIS — I252 Old myocardial infarction: Secondary | ICD-10-CM | POA: Insufficient documentation

## 2011-11-02 DIAGNOSIS — Z9861 Coronary angioplasty status: Secondary | ICD-10-CM | POA: Insufficient documentation

## 2011-11-02 DIAGNOSIS — I2582 Chronic total occlusion of coronary artery: Secondary | ICD-10-CM | POA: Insufficient documentation

## 2011-11-02 DIAGNOSIS — Z7982 Long term (current) use of aspirin: Secondary | ICD-10-CM | POA: Insufficient documentation

## 2011-11-02 DIAGNOSIS — E039 Hypothyroidism, unspecified: Secondary | ICD-10-CM | POA: Insufficient documentation

## 2011-11-02 DIAGNOSIS — E785 Hyperlipidemia, unspecified: Secondary | ICD-10-CM | POA: Insufficient documentation

## 2011-11-02 DIAGNOSIS — Z5189 Encounter for other specified aftercare: Secondary | ICD-10-CM | POA: Insufficient documentation

## 2011-11-03 ENCOUNTER — Other Ambulatory Visit: Payer: Self-pay

## 2011-11-03 MED ORDER — CLOPIDOGREL BISULFATE 75 MG PO TABS: 75.0000 mg | ORAL_TABLET | Freq: Every day | ORAL | Status: DC

## 2011-11-04 ENCOUNTER — Encounter (HOSPITAL_COMMUNITY): Payer: Self-pay

## 2011-11-06 ENCOUNTER — Encounter (HOSPITAL_COMMUNITY): Payer: Self-pay

## 2011-11-09 ENCOUNTER — Encounter (HOSPITAL_COMMUNITY)
Admission: RE | Admit: 2011-11-09 | Discharge: 2011-11-09 | Disposition: A | Discharge status: Home / Self Care | Payer: Self-pay | Source: Ambulatory Visit | Attending: Cardiology | Admitting: Cardiology

## 2011-11-09 ENCOUNTER — Other Ambulatory Visit: Payer: Self-pay | Admitting: Cardiology

## 2011-11-09 MED ORDER — ROSUVASTATIN CALCIUM 10 MG PO TABS: 10.0000 mg | ORAL_TABLET | Freq: Every day | ORAL | Status: DC

## 2011-11-11 ENCOUNTER — Encounter (HOSPITAL_COMMUNITY)
Admission: RE | Admit: 2011-11-11 | Discharge: 2011-11-11 | Disposition: A | Discharge status: Home / Self Care | Payer: Self-pay | Source: Ambulatory Visit | Attending: Cardiology | Admitting: Cardiology

## 2011-11-13 ENCOUNTER — Encounter (HOSPITAL_COMMUNITY)
Admission: RE | Admit: 2011-11-13 | Discharge: 2011-11-13 | Disposition: A | Discharge status: Home / Self Care | Payer: Self-pay | Source: Ambulatory Visit | Attending: Cardiology | Admitting: Cardiology

## 2011-11-16 ENCOUNTER — Encounter (HOSPITAL_COMMUNITY)
Admission: RE | Admit: 2011-11-16 | Discharge: 2011-11-16 | Disposition: A | Discharge status: Home / Self Care | Payer: Self-pay | Source: Ambulatory Visit | Attending: Cardiology | Admitting: Cardiology

## 2011-11-18 ENCOUNTER — Encounter (HOSPITAL_COMMUNITY)
Admission: RE | Admit: 2011-11-18 | Discharge: 2011-11-18 | Disposition: A | Discharge status: Home / Self Care | Payer: Self-pay | Source: Ambulatory Visit | Attending: Cardiology | Admitting: Cardiology

## 2011-11-20 ENCOUNTER — Encounter (HOSPITAL_COMMUNITY)
Admission: RE | Admit: 2011-11-20 | Discharge: 2011-11-20 | Disposition: A | Discharge status: Home / Self Care | Payer: Self-pay | Source: Ambulatory Visit | Attending: Cardiology | Admitting: Cardiology

## 2011-11-23 ENCOUNTER — Encounter (HOSPITAL_COMMUNITY): Payer: Self-pay

## 2011-11-25 ENCOUNTER — Encounter (HOSPITAL_COMMUNITY): Payer: Self-pay

## 2011-11-27 ENCOUNTER — Encounter (HOSPITAL_COMMUNITY): Payer: Self-pay

## 2011-11-30 ENCOUNTER — Encounter (HOSPITAL_COMMUNITY): Payer: Self-pay

## 2011-12-02 ENCOUNTER — Encounter (HOSPITAL_COMMUNITY): Payer: Self-pay

## 2011-12-04 ENCOUNTER — Encounter (HOSPITAL_COMMUNITY)
Admission: RE | Admit: 2011-12-04 | Discharge: 2011-12-04 | Disposition: A | Discharge status: Home / Self Care | Payer: Self-pay | Source: Ambulatory Visit | Attending: Cardiology | Admitting: Cardiology

## 2011-12-04 DIAGNOSIS — Z5189 Encounter for other specified aftercare: Secondary | ICD-10-CM | POA: Insufficient documentation

## 2011-12-04 DIAGNOSIS — Z7982 Long term (current) use of aspirin: Secondary | ICD-10-CM | POA: Insufficient documentation

## 2011-12-04 DIAGNOSIS — E785 Hyperlipidemia, unspecified: Secondary | ICD-10-CM | POA: Insufficient documentation

## 2011-12-04 DIAGNOSIS — I252 Old myocardial infarction: Secondary | ICD-10-CM | POA: Insufficient documentation

## 2011-12-04 DIAGNOSIS — K219 Gastro-esophageal reflux disease without esophagitis: Secondary | ICD-10-CM | POA: Insufficient documentation

## 2011-12-04 DIAGNOSIS — E039 Hypothyroidism, unspecified: Secondary | ICD-10-CM | POA: Insufficient documentation

## 2011-12-04 DIAGNOSIS — I2582 Chronic total occlusion of coronary artery: Secondary | ICD-10-CM | POA: Insufficient documentation

## 2011-12-04 DIAGNOSIS — I2510 Atherosclerotic heart disease of native coronary artery without angina pectoris: Secondary | ICD-10-CM | POA: Insufficient documentation

## 2011-12-04 DIAGNOSIS — Z9861 Coronary angioplasty status: Secondary | ICD-10-CM | POA: Insufficient documentation

## 2011-12-07 ENCOUNTER — Encounter (HOSPITAL_COMMUNITY)
Admission: RE | Admit: 2011-12-07 | Discharge: 2011-12-07 | Disposition: A | Discharge status: Home / Self Care | Payer: Self-pay | Source: Ambulatory Visit | Attending: Cardiology | Admitting: Cardiology

## 2011-12-09 ENCOUNTER — Encounter (HOSPITAL_COMMUNITY)
Admission: RE | Admit: 2011-12-09 | Discharge: 2011-12-09 | Disposition: A | Discharge status: Home / Self Care | Payer: Self-pay | Source: Ambulatory Visit | Attending: Cardiology | Admitting: Cardiology

## 2011-12-11 ENCOUNTER — Encounter (HOSPITAL_COMMUNITY): Payer: Self-pay

## 2011-12-14 ENCOUNTER — Encounter (HOSPITAL_COMMUNITY): Payer: Self-pay

## 2011-12-16 ENCOUNTER — Encounter (HOSPITAL_COMMUNITY): Payer: Self-pay

## 2011-12-16 ENCOUNTER — Encounter (HOSPITAL_COMMUNITY)
Admission: RE | Admit: 2011-12-16 | Discharge: 2011-12-16 | Disposition: A | Discharge status: Home / Self Care | Payer: Self-pay | Source: Ambulatory Visit | Attending: Cardiology | Admitting: Cardiology

## 2011-12-18 ENCOUNTER — Encounter (HOSPITAL_COMMUNITY): Payer: Self-pay

## 2011-12-21 ENCOUNTER — Encounter (HOSPITAL_COMMUNITY)
Admission: RE | Admit: 2011-12-21 | Discharge: 2011-12-21 | Disposition: A | Discharge status: Home / Self Care | Payer: Self-pay | Source: Ambulatory Visit | Attending: Cardiology | Admitting: Cardiology

## 2011-12-23 ENCOUNTER — Encounter (HOSPITAL_COMMUNITY)
Admission: RE | Admit: 2011-12-23 | Discharge: 2011-12-23 | Disposition: A | Discharge status: Home / Self Care | Payer: Self-pay | Source: Ambulatory Visit | Attending: Cardiology | Admitting: Cardiology

## 2011-12-25 ENCOUNTER — Encounter (HOSPITAL_COMMUNITY): Payer: Self-pay

## 2011-12-28 ENCOUNTER — Encounter (HOSPITAL_COMMUNITY)
Admission: RE | Admit: 2011-12-28 | Discharge: 2011-12-28 | Disposition: A | Discharge status: Home / Self Care | Payer: Self-pay | Source: Ambulatory Visit | Attending: Cardiology | Admitting: Cardiology

## 2011-12-30 ENCOUNTER — Encounter (HOSPITAL_COMMUNITY)
Admission: RE | Admit: 2011-12-30 | Discharge: 2011-12-30 | Disposition: A | Discharge status: Home / Self Care | Payer: Self-pay | Source: Ambulatory Visit | Attending: Cardiology | Admitting: Cardiology

## 2012-01-01 ENCOUNTER — Encounter (HOSPITAL_COMMUNITY)
Admission: RE | Admit: 2012-01-01 | Discharge: 2012-01-01 | Disposition: A | Discharge status: Home / Self Care | Payer: Self-pay | Source: Ambulatory Visit | Attending: Cardiology | Admitting: Cardiology

## 2012-01-01 DIAGNOSIS — E039 Hypothyroidism, unspecified: Secondary | ICD-10-CM | POA: Insufficient documentation

## 2012-01-01 DIAGNOSIS — I252 Old myocardial infarction: Secondary | ICD-10-CM | POA: Insufficient documentation

## 2012-01-01 DIAGNOSIS — Z7982 Long term (current) use of aspirin: Secondary | ICD-10-CM | POA: Insufficient documentation

## 2012-01-01 DIAGNOSIS — Z9861 Coronary angioplasty status: Secondary | ICD-10-CM | POA: Insufficient documentation

## 2012-01-01 DIAGNOSIS — E785 Hyperlipidemia, unspecified: Secondary | ICD-10-CM | POA: Insufficient documentation

## 2012-01-01 DIAGNOSIS — I2510 Atherosclerotic heart disease of native coronary artery without angina pectoris: Secondary | ICD-10-CM | POA: Insufficient documentation

## 2012-01-01 DIAGNOSIS — I2582 Chronic total occlusion of coronary artery: Secondary | ICD-10-CM | POA: Insufficient documentation

## 2012-01-01 DIAGNOSIS — K219 Gastro-esophageal reflux disease without esophagitis: Secondary | ICD-10-CM | POA: Insufficient documentation

## 2012-01-01 DIAGNOSIS — Z5189 Encounter for other specified aftercare: Secondary | ICD-10-CM | POA: Insufficient documentation

## 2012-01-02 ENCOUNTER — Other Ambulatory Visit: Payer: Self-pay | Admitting: Cardiology

## 2012-01-04 ENCOUNTER — Encounter (HOSPITAL_COMMUNITY): Payer: Self-pay

## 2012-01-04 ENCOUNTER — Encounter (HOSPITAL_COMMUNITY)
Admission: RE | Admit: 2012-01-04 | Discharge: 2012-01-04 | Disposition: A | Discharge status: Home / Self Care | Payer: Self-pay | Source: Ambulatory Visit | Attending: Cardiology | Admitting: Cardiology

## 2012-01-06 ENCOUNTER — Encounter (HOSPITAL_COMMUNITY): Payer: Self-pay

## 2012-01-06 ENCOUNTER — Encounter (HOSPITAL_COMMUNITY)
Admission: RE | Admit: 2012-01-06 | Discharge: 2012-01-06 | Disposition: A | Discharge status: Home / Self Care | Payer: Self-pay | Source: Ambulatory Visit | Attending: Cardiology | Admitting: Cardiology

## 2012-01-08 ENCOUNTER — Encounter (HOSPITAL_COMMUNITY)
Admission: RE | Admit: 2012-01-08 | Discharge: 2012-01-08 | Disposition: A | Discharge status: Home / Self Care | Payer: Self-pay | Source: Ambulatory Visit | Attending: Cardiology | Admitting: Cardiology

## 2012-01-08 ENCOUNTER — Encounter (HOSPITAL_COMMUNITY): Payer: Self-pay

## 2012-01-11 ENCOUNTER — Encounter (HOSPITAL_COMMUNITY): Payer: Self-pay

## 2012-01-11 ENCOUNTER — Encounter (HOSPITAL_COMMUNITY)
Admission: RE | Admit: 2012-01-11 | Discharge: 2012-01-11 | Disposition: A | Discharge status: Home / Self Care | Payer: Self-pay | Source: Ambulatory Visit | Attending: Cardiology | Admitting: Cardiology

## 2012-01-12 ENCOUNTER — Ambulatory Visit: Payer: Self-pay | Admitting: Cardiology

## 2012-01-13 ENCOUNTER — Encounter (HOSPITAL_COMMUNITY): Payer: Self-pay

## 2012-01-13 ENCOUNTER — Encounter (HOSPITAL_COMMUNITY)
Admission: RE | Admit: 2012-01-13 | Discharge: 2012-01-13 | Disposition: A | Discharge status: Home / Self Care | Payer: Self-pay | Source: Ambulatory Visit | Attending: Cardiology | Admitting: Cardiology

## 2012-01-15 ENCOUNTER — Encounter (HOSPITAL_COMMUNITY): Payer: Self-pay

## 2012-01-15 ENCOUNTER — Encounter (HOSPITAL_COMMUNITY)
Admission: RE | Admit: 2012-01-15 | Discharge: 2012-01-15 | Disposition: A | Discharge status: Home / Self Care | Payer: Self-pay | Source: Ambulatory Visit | Attending: Cardiology | Admitting: Cardiology

## 2012-01-18 ENCOUNTER — Encounter (HOSPITAL_COMMUNITY)
Admission: RE | Admit: 2012-01-18 | Discharge: 2012-01-18 | Disposition: A | Discharge status: Home / Self Care | Payer: Self-pay | Source: Ambulatory Visit | Attending: Cardiology | Admitting: Cardiology

## 2012-01-18 ENCOUNTER — Encounter (HOSPITAL_COMMUNITY): Payer: Self-pay

## 2012-01-20 ENCOUNTER — Encounter (HOSPITAL_COMMUNITY)
Admission: RE | Admit: 2012-01-20 | Discharge: 2012-01-20 | Disposition: A | Discharge status: Home / Self Care | Payer: Self-pay | Source: Ambulatory Visit | Attending: Cardiology | Admitting: Cardiology

## 2012-01-20 ENCOUNTER — Encounter (HOSPITAL_COMMUNITY): Payer: Self-pay

## 2012-01-22 ENCOUNTER — Encounter (HOSPITAL_COMMUNITY): Payer: Self-pay

## 2012-01-22 ENCOUNTER — Encounter: Payer: Self-pay | Admitting: *Deleted

## 2012-01-25 ENCOUNTER — Encounter (HOSPITAL_COMMUNITY)
Admission: RE | Admit: 2012-01-25 | Discharge: 2012-01-25 | Disposition: A | Discharge status: Home / Self Care | Payer: Medicare Other | Source: Ambulatory Visit | Attending: Cardiology | Admitting: Cardiology

## 2012-01-25 ENCOUNTER — Encounter (HOSPITAL_COMMUNITY): Payer: Self-pay

## 2012-01-26 ENCOUNTER — Ambulatory Visit (INDEPENDENT_AMBULATORY_CARE_PROVIDER_SITE_OTHER): Payer: Medicare Other | Admitting: Cardiology

## 2012-01-26 ENCOUNTER — Encounter: Payer: Self-pay | Admitting: Cardiology

## 2012-01-26 VITALS — BP 120/70 | HR 53 | Wt 178.6 lb

## 2012-01-26 DIAGNOSIS — E785 Hyperlipidemia, unspecified: Secondary | ICD-10-CM

## 2012-01-26 DIAGNOSIS — I2510 Atherosclerotic heart disease of native coronary artery without angina pectoris: Secondary | ICD-10-CM

## 2012-01-26 NOTE — Assessment & Plan Note (Signed)
He is on chronic statin therapy. We will await his followup lab results next month.

## 2012-01-26 NOTE — Progress Notes (Signed)
Joseph Roman Date of Birth: [birth year] Medical Record #[id number]  History of Present Illness: Joseph Roman is seen for yearly followup. He has a history of coronary disease and is status post inferior lateral myocardial infarction in June of 2009. He had stenting of the mid left circumflex coronary emergently with a 2.75 x 23 mm Promus stent. His last stress test in July of 2010 was normal. He continues to do very well. He has had no significant chest pain or shortness of breath. He is still an active participant in cardiac rehabilitation. He is scheduled for his yearly complete physical next month with Dr. Timothy Lasso.  Current Outpatient Prescriptions on File Prior to Visit  Medication Sig Dispense Refill  . aspirin 81 MG tablet Take 81 mg by mouth daily.      . clopidogrel (PLAVIX) 75 MG tablet TAKE 1 TABLET (75 MG TOTAL) BY MOUTH DAILY.  30 tablet  0  . levothyroxine (SYNTHROID, LEVOTHROID) 112 MCG tablet Take 112 mcg by mouth daily.      . nitroGLYCERIN (NITROSTAT) 0.4 MG SL tablet Place 1 tablet (0.4 mg total) under the tongue every 5 (five) minutes as needed for chest pain.  25 tablet  11  . rosuvastatin (CRESTOR) 10 MG tablet Take 1 tablet (10 mg total) by mouth daily.  30 tablet  5    Allergies  Allergen Reactions  . Other     Beta blockers-bradycardia    Past Medical History  Diagnosis Date  . Acute inferolateral myocardial infarction     04/2008 2.75x23 Promus Lcx  . Coronary artery disease   . Hypothyroidism   . Dyslipidemia     Mild dyslipidemia  . GERD (gastroesophageal reflux disease)     Past Surgical History  Procedure Date  . Cardiac catheterization 05/07/2008    Ejection fraction was estimated at 60%  . Colonoscopy 04/21/2005    with polypectomy  . Tonsillectomy   . Cataract     History  Smoking status  . Never Smoker   Smokeless tobacco  . Not on file    History  Alcohol Use     History reviewed. No pertinent family history.  Review of  Systems: As noted in history of present illness.  All other systems were reviewed and are negative.  Physical Exam: BP 120/70  Pulse 53  Wt 178 lb 9.6 oz (81.012 kg) Is a pleasant, elderly white male in no acute distress.The patient is alert and oriented x 3.  The mood and affect are normal.  The skin is warm and dry.  Color is normal.  The HEENT exam reveals that the sclera are nonicteric.  The mucous membranes are moist.  The carotids are 2+ without bruits.  There is no thyromegaly.  There is no JVD.  The lungs are clear.  The chest wall is non tender.  The heart exam reveals a regular rate with a normal S1 and S2.  There are no murmurs, gallops, or rubs.  The PMI is not displaced.   Abdominal exam reveals good bowel sounds.  There is no guarding or rebound.  There is no hepatosplenomegaly or tenderness.  There are no masses.  Exam of the legs reveal no clubbing, cyanosis, or edema.  The legs are without rashes.  The distal pulses are intact.  Cranial nerves II - XII are intact.  Motor and sensory functions are intact.  The gait is normal.  LABORATORY DATA: ECG today shows sinus bradycardia with a rate of 53  beats per minute. It is otherwise normal.  Assessment / Plan:

## 2012-01-26 NOTE — Patient Instructions (Signed)
Continue your current therapy  I will see you again in 1 year.   

## 2012-01-26 NOTE — Assessment & Plan Note (Signed)
He remains asymptomatic and is on appropriate medical therapy. He is intolerant to beta blockers due to to a history of bradycardia. We will continue with his current therapy and followup again in one year.

## 2012-01-27 ENCOUNTER — Encounter (HOSPITAL_COMMUNITY): Payer: Self-pay

## 2012-01-27 ENCOUNTER — Encounter (HOSPITAL_COMMUNITY): Payer: Medicare Other

## 2012-01-29 ENCOUNTER — Encounter (HOSPITAL_COMMUNITY): Payer: Self-pay

## 2012-01-29 ENCOUNTER — Encounter (HOSPITAL_COMMUNITY)
Admission: RE | Admit: 2012-01-29 | Discharge: 2012-01-29 | Disposition: A | Discharge status: Home / Self Care | Payer: Self-pay | Source: Ambulatory Visit | Attending: Cardiology | Admitting: Cardiology

## 2012-01-29 ENCOUNTER — Other Ambulatory Visit: Payer: Self-pay | Admitting: Cardiology

## 2012-01-29 DIAGNOSIS — Z5189 Encounter for other specified aftercare: Secondary | ICD-10-CM | POA: Insufficient documentation

## 2012-01-29 DIAGNOSIS — E785 Hyperlipidemia, unspecified: Secondary | ICD-10-CM | POA: Insufficient documentation

## 2012-01-29 DIAGNOSIS — Z7982 Long term (current) use of aspirin: Secondary | ICD-10-CM | POA: Insufficient documentation

## 2012-01-29 DIAGNOSIS — I252 Old myocardial infarction: Secondary | ICD-10-CM | POA: Insufficient documentation

## 2012-01-29 DIAGNOSIS — E039 Hypothyroidism, unspecified: Secondary | ICD-10-CM | POA: Insufficient documentation

## 2012-01-29 DIAGNOSIS — I2510 Atherosclerotic heart disease of native coronary artery without angina pectoris: Secondary | ICD-10-CM | POA: Insufficient documentation

## 2012-01-29 DIAGNOSIS — K219 Gastro-esophageal reflux disease without esophagitis: Secondary | ICD-10-CM | POA: Insufficient documentation

## 2012-01-29 DIAGNOSIS — Z9861 Coronary angioplasty status: Secondary | ICD-10-CM | POA: Insufficient documentation

## 2012-01-29 DIAGNOSIS — I2582 Chronic total occlusion of coronary artery: Secondary | ICD-10-CM | POA: Insufficient documentation

## 2012-02-01 ENCOUNTER — Encounter (HOSPITAL_COMMUNITY): Payer: Self-pay

## 2012-02-03 ENCOUNTER — Encounter (HOSPITAL_COMMUNITY)
Admission: RE | Admit: 2012-02-03 | Discharge: 2012-02-03 | Disposition: A | Discharge status: Home / Self Care | Payer: Self-pay | Source: Ambulatory Visit | Attending: Cardiology | Admitting: Cardiology

## 2012-02-03 ENCOUNTER — Encounter (HOSPITAL_COMMUNITY): Payer: Self-pay

## 2012-02-05 ENCOUNTER — Encounter (HOSPITAL_COMMUNITY)
Admission: RE | Admit: 2012-02-05 | Discharge: 2012-02-05 | Disposition: A | Discharge status: Home / Self Care | Payer: Self-pay | Source: Ambulatory Visit | Attending: Cardiology | Admitting: Cardiology

## 2012-02-05 ENCOUNTER — Encounter (HOSPITAL_COMMUNITY): Payer: Self-pay

## 2012-02-08 ENCOUNTER — Encounter (HOSPITAL_COMMUNITY): Payer: Self-pay

## 2012-02-08 ENCOUNTER — Encounter (HOSPITAL_COMMUNITY)
Admission: RE | Admit: 2012-02-08 | Discharge: 2012-02-08 | Disposition: A | Discharge status: Home / Self Care | Payer: Self-pay | Source: Ambulatory Visit | Attending: Cardiology | Admitting: Cardiology

## 2012-02-10 ENCOUNTER — Encounter (HOSPITAL_COMMUNITY)
Admission: RE | Admit: 2012-02-10 | Discharge: 2012-02-10 | Disposition: A | Discharge status: Home / Self Care | Payer: Self-pay | Source: Ambulatory Visit | Attending: Cardiology | Admitting: Cardiology

## 2012-02-10 ENCOUNTER — Encounter (HOSPITAL_COMMUNITY): Payer: Self-pay

## 2012-02-12 ENCOUNTER — Encounter (HOSPITAL_COMMUNITY): Payer: Self-pay

## 2012-02-12 ENCOUNTER — Encounter (HOSPITAL_COMMUNITY)
Admission: RE | Admit: 2012-02-12 | Discharge: 2012-02-12 | Disposition: A | Discharge status: Home / Self Care | Payer: Self-pay | Source: Ambulatory Visit | Attending: Cardiology | Admitting: Cardiology

## 2012-02-15 ENCOUNTER — Encounter (HOSPITAL_COMMUNITY): Payer: Self-pay

## 2012-02-16 ENCOUNTER — Encounter: Payer: Self-pay | Admitting: Cardiology

## 2012-02-16 DIAGNOSIS — Z125 Encounter for screening for malignant neoplasm of prostate: Secondary | ICD-10-CM | POA: Diagnosis not present

## 2012-02-16 DIAGNOSIS — E785 Hyperlipidemia, unspecified: Secondary | ICD-10-CM | POA: Diagnosis not present

## 2012-02-16 DIAGNOSIS — Z0000 Encounter for general adult medical examination without abnormal findings: Secondary | ICD-10-CM | POA: Diagnosis not present

## 2012-02-16 DIAGNOSIS — E039 Hypothyroidism, unspecified: Secondary | ICD-10-CM | POA: Diagnosis not present

## 2012-02-17 ENCOUNTER — Encounter (HOSPITAL_COMMUNITY): Payer: Self-pay

## 2012-02-19 ENCOUNTER — Encounter (HOSPITAL_COMMUNITY): Payer: Self-pay

## 2012-02-19 ENCOUNTER — Encounter (HOSPITAL_COMMUNITY)
Admission: RE | Admit: 2012-02-19 | Discharge: 2012-02-19 | Disposition: A | Discharge status: Home / Self Care | Payer: Self-pay | Source: Ambulatory Visit | Attending: Cardiology | Admitting: Cardiology

## 2012-02-22 ENCOUNTER — Encounter (HOSPITAL_COMMUNITY): Payer: Self-pay

## 2012-02-24 ENCOUNTER — Encounter (HOSPITAL_COMMUNITY): Payer: Self-pay

## 2012-02-24 ENCOUNTER — Encounter (HOSPITAL_COMMUNITY)
Admission: RE | Admit: 2012-02-24 | Discharge: 2012-02-24 | Disposition: A | Discharge status: Home / Self Care | Payer: Self-pay | Source: Ambulatory Visit | Attending: Cardiology | Admitting: Cardiology

## 2012-02-26 ENCOUNTER — Encounter (HOSPITAL_COMMUNITY)
Admission: RE | Admit: 2012-02-26 | Discharge: 2012-02-26 | Disposition: A | Discharge status: Home / Self Care | Payer: Self-pay | Source: Ambulatory Visit | Attending: Cardiology | Admitting: Cardiology

## 2012-02-26 ENCOUNTER — Encounter (HOSPITAL_COMMUNITY): Payer: Self-pay

## 2012-02-29 ENCOUNTER — Encounter (HOSPITAL_COMMUNITY): Payer: Self-pay

## 2012-02-29 ENCOUNTER — Encounter (HOSPITAL_COMMUNITY)
Admission: RE | Admit: 2012-02-29 | Discharge: 2012-02-29 | Disposition: A | Discharge status: Home / Self Care | Payer: Self-pay | Source: Ambulatory Visit | Attending: Cardiology | Admitting: Cardiology

## 2012-02-29 DIAGNOSIS — Z7982 Long term (current) use of aspirin: Secondary | ICD-10-CM | POA: Insufficient documentation

## 2012-02-29 DIAGNOSIS — E039 Hypothyroidism, unspecified: Secondary | ICD-10-CM | POA: Insufficient documentation

## 2012-02-29 DIAGNOSIS — I2510 Atherosclerotic heart disease of native coronary artery without angina pectoris: Secondary | ICD-10-CM | POA: Insufficient documentation

## 2012-02-29 DIAGNOSIS — E785 Hyperlipidemia, unspecified: Secondary | ICD-10-CM | POA: Insufficient documentation

## 2012-02-29 DIAGNOSIS — I2582 Chronic total occlusion of coronary artery: Secondary | ICD-10-CM | POA: Insufficient documentation

## 2012-02-29 DIAGNOSIS — Z5189 Encounter for other specified aftercare: Secondary | ICD-10-CM | POA: Insufficient documentation

## 2012-02-29 DIAGNOSIS — K219 Gastro-esophageal reflux disease without esophagitis: Secondary | ICD-10-CM | POA: Insufficient documentation

## 2012-02-29 DIAGNOSIS — I252 Old myocardial infarction: Secondary | ICD-10-CM | POA: Insufficient documentation

## 2012-02-29 DIAGNOSIS — Z9861 Coronary angioplasty status: Secondary | ICD-10-CM | POA: Insufficient documentation

## 2012-03-02 ENCOUNTER — Encounter (HOSPITAL_COMMUNITY)
Admission: RE | Admit: 2012-03-02 | Discharge: 2012-03-02 | Disposition: A | Discharge status: Home / Self Care | Payer: Self-pay | Source: Ambulatory Visit | Attending: Cardiology | Admitting: Cardiology

## 2012-03-02 ENCOUNTER — Encounter (HOSPITAL_COMMUNITY): Payer: Self-pay

## 2012-03-04 ENCOUNTER — Encounter (HOSPITAL_COMMUNITY)
Admission: RE | Admit: 2012-03-04 | Discharge: 2012-03-04 | Disposition: A | Discharge status: Home / Self Care | Payer: Self-pay | Source: Ambulatory Visit | Attending: Cardiology | Admitting: Cardiology

## 2012-03-04 ENCOUNTER — Encounter (HOSPITAL_COMMUNITY): Payer: Self-pay

## 2012-03-07 ENCOUNTER — Encounter (HOSPITAL_COMMUNITY)
Admission: RE | Admit: 2012-03-07 | Discharge: 2012-03-07 | Disposition: A | Discharge status: Home / Self Care | Payer: Self-pay | Source: Ambulatory Visit | Attending: Cardiology | Admitting: Cardiology

## 2012-03-07 ENCOUNTER — Encounter (HOSPITAL_COMMUNITY): Payer: Self-pay

## 2012-03-09 ENCOUNTER — Encounter (HOSPITAL_COMMUNITY): Payer: Self-pay

## 2012-03-09 ENCOUNTER — Encounter (HOSPITAL_COMMUNITY)
Admission: RE | Admit: 2012-03-09 | Discharge: 2012-03-09 | Disposition: A | Discharge status: Home / Self Care | Payer: Self-pay | Source: Ambulatory Visit | Attending: Cardiology | Admitting: Cardiology

## 2012-03-11 ENCOUNTER — Encounter (HOSPITAL_COMMUNITY)
Admission: RE | Admit: 2012-03-11 | Discharge: 2012-03-11 | Disposition: A | Discharge status: Home / Self Care | Payer: Self-pay | Source: Ambulatory Visit | Attending: Cardiology | Admitting: Cardiology

## 2012-03-11 ENCOUNTER — Encounter (HOSPITAL_COMMUNITY): Payer: Self-pay

## 2012-03-14 ENCOUNTER — Encounter (HOSPITAL_COMMUNITY): Payer: Self-pay

## 2012-03-14 ENCOUNTER — Encounter (HOSPITAL_COMMUNITY)
Admission: RE | Admit: 2012-03-14 | Discharge: 2012-03-14 | Disposition: A | Discharge status: Home / Self Care | Payer: Self-pay | Source: Ambulatory Visit | Attending: Cardiology | Admitting: Cardiology

## 2012-03-16 ENCOUNTER — Encounter (HOSPITAL_COMMUNITY): Payer: Self-pay

## 2012-03-16 ENCOUNTER — Encounter (HOSPITAL_COMMUNITY)
Admission: RE | Admit: 2012-03-16 | Discharge: 2012-03-16 | Disposition: A | Discharge status: Home / Self Care | Payer: Self-pay | Source: Ambulatory Visit | Attending: Cardiology | Admitting: Cardiology

## 2012-03-18 ENCOUNTER — Encounter (HOSPITAL_COMMUNITY): Payer: Self-pay

## 2012-03-18 ENCOUNTER — Encounter (HOSPITAL_COMMUNITY)
Admission: RE | Admit: 2012-03-18 | Discharge: 2012-03-18 | Disposition: A | Discharge status: Home / Self Care | Payer: Self-pay | Source: Ambulatory Visit | Attending: Cardiology | Admitting: Cardiology

## 2012-03-21 ENCOUNTER — Encounter (HOSPITAL_COMMUNITY): Payer: Self-pay

## 2012-03-21 ENCOUNTER — Encounter (HOSPITAL_COMMUNITY)
Admission: RE | Admit: 2012-03-21 | Discharge: 2012-03-21 | Disposition: A | Discharge status: Home / Self Care | Payer: Self-pay | Source: Ambulatory Visit | Attending: Cardiology | Admitting: Cardiology

## 2012-03-23 ENCOUNTER — Encounter (HOSPITAL_COMMUNITY): Payer: Self-pay

## 2012-03-23 ENCOUNTER — Encounter (HOSPITAL_COMMUNITY)
Admission: RE | Admit: 2012-03-23 | Discharge: 2012-03-23 | Disposition: A | Discharge status: Home / Self Care | Payer: Self-pay | Source: Ambulatory Visit | Attending: Cardiology | Admitting: Cardiology

## 2012-03-25 ENCOUNTER — Encounter (HOSPITAL_COMMUNITY)
Admission: RE | Admit: 2012-03-25 | Discharge: 2012-03-25 | Disposition: A | Discharge status: Home / Self Care | Payer: Self-pay | Source: Ambulatory Visit | Attending: Cardiology | Admitting: Cardiology

## 2012-03-25 ENCOUNTER — Encounter (HOSPITAL_COMMUNITY): Payer: Self-pay

## 2012-03-28 ENCOUNTER — Encounter (HOSPITAL_COMMUNITY): Payer: Self-pay

## 2012-03-30 ENCOUNTER — Encounter (HOSPITAL_COMMUNITY)
Admission: RE | Admit: 2012-03-30 | Discharge: 2012-03-30 | Disposition: A | Discharge status: Home / Self Care | Payer: Self-pay | Source: Ambulatory Visit | Attending: Cardiology | Admitting: Cardiology

## 2012-03-30 ENCOUNTER — Encounter (HOSPITAL_COMMUNITY): Payer: Self-pay

## 2012-03-30 DIAGNOSIS — E039 Hypothyroidism, unspecified: Secondary | ICD-10-CM | POA: Insufficient documentation

## 2012-03-30 DIAGNOSIS — Z7982 Long term (current) use of aspirin: Secondary | ICD-10-CM | POA: Insufficient documentation

## 2012-03-30 DIAGNOSIS — Z9861 Coronary angioplasty status: Secondary | ICD-10-CM | POA: Insufficient documentation

## 2012-03-30 DIAGNOSIS — I2510 Atherosclerotic heart disease of native coronary artery without angina pectoris: Secondary | ICD-10-CM | POA: Insufficient documentation

## 2012-03-30 DIAGNOSIS — I2582 Chronic total occlusion of coronary artery: Secondary | ICD-10-CM | POA: Insufficient documentation

## 2012-03-30 DIAGNOSIS — I252 Old myocardial infarction: Secondary | ICD-10-CM | POA: Insufficient documentation

## 2012-03-30 DIAGNOSIS — K219 Gastro-esophageal reflux disease without esophagitis: Secondary | ICD-10-CM | POA: Insufficient documentation

## 2012-03-30 DIAGNOSIS — E785 Hyperlipidemia, unspecified: Secondary | ICD-10-CM | POA: Insufficient documentation

## 2012-03-30 DIAGNOSIS — Z5189 Encounter for other specified aftercare: Secondary | ICD-10-CM | POA: Insufficient documentation

## 2012-04-01 ENCOUNTER — Encounter (HOSPITAL_COMMUNITY): Payer: Self-pay

## 2012-04-01 ENCOUNTER — Encounter (HOSPITAL_COMMUNITY)
Admission: RE | Admit: 2012-04-01 | Discharge: 2012-04-01 | Disposition: A | Discharge status: Home / Self Care | Payer: Self-pay | Source: Ambulatory Visit | Attending: Cardiology | Admitting: Cardiology

## 2012-04-01 DIAGNOSIS — Z0000 Encounter for general adult medical examination without abnormal findings: Secondary | ICD-10-CM | POA: Diagnosis not present

## 2012-04-01 DIAGNOSIS — E785 Hyperlipidemia, unspecified: Secondary | ICD-10-CM | POA: Diagnosis not present

## 2012-04-01 DIAGNOSIS — I2510 Atherosclerotic heart disease of native coronary artery without angina pectoris: Secondary | ICD-10-CM | POA: Diagnosis not present

## 2012-04-01 DIAGNOSIS — Z125 Encounter for screening for malignant neoplasm of prostate: Secondary | ICD-10-CM | POA: Diagnosis not present

## 2012-04-04 ENCOUNTER — Encounter (HOSPITAL_COMMUNITY): Payer: Self-pay

## 2012-04-04 ENCOUNTER — Encounter (HOSPITAL_COMMUNITY)
Admission: RE | Admit: 2012-04-04 | Discharge: 2012-04-04 | Disposition: A | Discharge status: Home / Self Care | Payer: Self-pay | Source: Ambulatory Visit | Attending: Cardiology | Admitting: Cardiology

## 2012-04-06 ENCOUNTER — Encounter (HOSPITAL_COMMUNITY): Payer: Self-pay

## 2012-04-06 ENCOUNTER — Encounter (HOSPITAL_COMMUNITY)
Admission: RE | Admit: 2012-04-06 | Discharge: 2012-04-06 | Disposition: A | Discharge status: Home / Self Care | Payer: Self-pay | Source: Ambulatory Visit | Attending: Cardiology | Admitting: Cardiology

## 2012-04-08 ENCOUNTER — Encounter (HOSPITAL_COMMUNITY): Payer: Self-pay

## 2012-04-08 ENCOUNTER — Encounter (HOSPITAL_COMMUNITY)
Admission: RE | Admit: 2012-04-08 | Discharge: 2012-04-08 | Disposition: A | Discharge status: Home / Self Care | Payer: Self-pay | Source: Ambulatory Visit | Attending: Cardiology | Admitting: Cardiology

## 2012-04-11 ENCOUNTER — Encounter (HOSPITAL_COMMUNITY): Payer: Self-pay

## 2012-04-13 ENCOUNTER — Encounter (HOSPITAL_COMMUNITY)
Admission: RE | Admit: 2012-04-13 | Discharge: 2012-04-13 | Disposition: A | Discharge status: Home / Self Care | Payer: Self-pay | Source: Ambulatory Visit | Attending: Cardiology | Admitting: Cardiology

## 2012-04-13 ENCOUNTER — Encounter (HOSPITAL_COMMUNITY): Payer: Self-pay

## 2012-04-15 ENCOUNTER — Encounter (HOSPITAL_COMMUNITY): Payer: Self-pay

## 2012-04-18 ENCOUNTER — Encounter (HOSPITAL_COMMUNITY): Payer: Self-pay

## 2012-04-20 ENCOUNTER — Encounter (HOSPITAL_COMMUNITY): Payer: Self-pay

## 2012-04-20 ENCOUNTER — Encounter (HOSPITAL_COMMUNITY)
Admission: RE | Admit: 2012-04-20 | Discharge: 2012-04-20 | Disposition: A | Discharge status: Home / Self Care | Payer: Self-pay | Source: Ambulatory Visit | Attending: Cardiology | Admitting: Cardiology

## 2012-04-22 ENCOUNTER — Encounter (HOSPITAL_COMMUNITY): Payer: Self-pay

## 2012-04-22 ENCOUNTER — Encounter (HOSPITAL_COMMUNITY)
Admission: RE | Admit: 2012-04-22 | Discharge: 2012-04-22 | Disposition: A | Discharge status: Home / Self Care | Payer: Self-pay | Source: Ambulatory Visit | Attending: Cardiology | Admitting: Cardiology

## 2012-04-25 ENCOUNTER — Encounter (HOSPITAL_COMMUNITY): Payer: Self-pay

## 2012-04-27 ENCOUNTER — Encounter (HOSPITAL_COMMUNITY): Payer: Self-pay

## 2012-04-29 ENCOUNTER — Encounter (HOSPITAL_COMMUNITY): Payer: Self-pay

## 2012-05-02 ENCOUNTER — Encounter (HOSPITAL_COMMUNITY): Payer: Self-pay

## 2012-05-02 ENCOUNTER — Encounter (HOSPITAL_COMMUNITY)
Admission: RE | Admit: 2012-05-02 | Discharge: 2012-05-02 | Disposition: A | Discharge status: Home / Self Care | Payer: Self-pay | Source: Ambulatory Visit | Attending: Cardiology | Admitting: Cardiology

## 2012-05-02 DIAGNOSIS — Z7982 Long term (current) use of aspirin: Secondary | ICD-10-CM | POA: Insufficient documentation

## 2012-05-02 DIAGNOSIS — I2510 Atherosclerotic heart disease of native coronary artery without angina pectoris: Secondary | ICD-10-CM | POA: Insufficient documentation

## 2012-05-02 DIAGNOSIS — Z9861 Coronary angioplasty status: Secondary | ICD-10-CM | POA: Insufficient documentation

## 2012-05-02 DIAGNOSIS — K219 Gastro-esophageal reflux disease without esophagitis: Secondary | ICD-10-CM | POA: Insufficient documentation

## 2012-05-02 DIAGNOSIS — E785 Hyperlipidemia, unspecified: Secondary | ICD-10-CM | POA: Insufficient documentation

## 2012-05-02 DIAGNOSIS — E039 Hypothyroidism, unspecified: Secondary | ICD-10-CM | POA: Insufficient documentation

## 2012-05-02 DIAGNOSIS — Z5189 Encounter for other specified aftercare: Secondary | ICD-10-CM | POA: Insufficient documentation

## 2012-05-02 DIAGNOSIS — I2582 Chronic total occlusion of coronary artery: Secondary | ICD-10-CM | POA: Insufficient documentation

## 2012-05-02 DIAGNOSIS — I252 Old myocardial infarction: Secondary | ICD-10-CM | POA: Insufficient documentation

## 2012-05-04 ENCOUNTER — Encounter (HOSPITAL_COMMUNITY): Payer: Self-pay

## 2012-05-06 ENCOUNTER — Encounter (HOSPITAL_COMMUNITY)
Admission: RE | Admit: 2012-05-06 | Discharge: 2012-05-06 | Disposition: A | Discharge status: Home / Self Care | Payer: Self-pay | Source: Ambulatory Visit | Attending: Cardiology | Admitting: Cardiology

## 2012-05-06 ENCOUNTER — Encounter (HOSPITAL_COMMUNITY): Payer: Self-pay

## 2012-05-09 ENCOUNTER — Encounter (HOSPITAL_COMMUNITY)
Admission: RE | Admit: 2012-05-09 | Discharge: 2012-05-09 | Disposition: A | Discharge status: Home / Self Care | Payer: Self-pay | Source: Ambulatory Visit | Attending: Cardiology | Admitting: Cardiology

## 2012-05-09 ENCOUNTER — Other Ambulatory Visit: Payer: Self-pay | Admitting: Cardiology

## 2012-05-09 ENCOUNTER — Encounter (HOSPITAL_COMMUNITY): Payer: Self-pay

## 2012-05-09 DIAGNOSIS — L570 Actinic keratosis: Secondary | ICD-10-CM | POA: Diagnosis not present

## 2012-05-09 DIAGNOSIS — L259 Unspecified contact dermatitis, unspecified cause: Secondary | ICD-10-CM | POA: Diagnosis not present

## 2012-05-11 ENCOUNTER — Encounter (HOSPITAL_COMMUNITY)
Admission: RE | Admit: 2012-05-11 | Discharge: 2012-05-11 | Disposition: A | Discharge status: Home / Self Care | Payer: Self-pay | Source: Ambulatory Visit | Attending: Cardiology | Admitting: Cardiology

## 2012-05-11 ENCOUNTER — Encounter (HOSPITAL_COMMUNITY): Payer: Self-pay

## 2012-05-13 ENCOUNTER — Encounter (HOSPITAL_COMMUNITY): Payer: Self-pay

## 2012-05-13 DIAGNOSIS — H268 Other specified cataract: Secondary | ICD-10-CM | POA: Diagnosis not present

## 2012-05-13 DIAGNOSIS — H259 Unspecified age-related cataract: Secondary | ICD-10-CM | POA: Diagnosis not present

## 2012-05-13 DIAGNOSIS — Z961 Presence of intraocular lens: Secondary | ICD-10-CM | POA: Diagnosis not present

## 2012-05-13 DIAGNOSIS — H2640 Unspecified secondary cataract: Secondary | ICD-10-CM | POA: Diagnosis not present

## 2012-05-16 ENCOUNTER — Encounter (HOSPITAL_COMMUNITY): Payer: Self-pay

## 2012-05-16 ENCOUNTER — Encounter (HOSPITAL_COMMUNITY)
Admission: RE | Admit: 2012-05-16 | Discharge: 2012-05-16 | Disposition: A | Discharge status: Home / Self Care | Payer: Self-pay | Source: Ambulatory Visit | Attending: Cardiology | Admitting: Cardiology

## 2012-05-18 ENCOUNTER — Encounter (HOSPITAL_COMMUNITY)
Admission: RE | Admit: 2012-05-18 | Discharge: 2012-05-18 | Disposition: A | Discharge status: Home / Self Care | Payer: Self-pay | Source: Ambulatory Visit | Attending: Cardiology | Admitting: Cardiology

## 2012-05-18 ENCOUNTER — Encounter (HOSPITAL_COMMUNITY): Payer: Self-pay

## 2012-05-20 ENCOUNTER — Encounter (HOSPITAL_COMMUNITY): Payer: Self-pay

## 2012-05-20 ENCOUNTER — Encounter (HOSPITAL_COMMUNITY)
Admission: RE | Admit: 2012-05-20 | Discharge: 2012-05-20 | Disposition: A | Discharge status: Home / Self Care | Payer: Self-pay | Source: Ambulatory Visit | Attending: Cardiology | Admitting: Cardiology

## 2012-05-23 ENCOUNTER — Encounter (HOSPITAL_COMMUNITY): Payer: Self-pay

## 2012-05-25 ENCOUNTER — Encounter (HOSPITAL_COMMUNITY): Payer: Self-pay

## 2012-05-27 ENCOUNTER — Encounter (HOSPITAL_COMMUNITY): Payer: Self-pay

## 2012-05-27 ENCOUNTER — Encounter (HOSPITAL_COMMUNITY)
Admission: RE | Admit: 2012-05-27 | Discharge: 2012-05-27 | Disposition: A | Discharge status: Home / Self Care | Payer: Self-pay | Source: Ambulatory Visit | Attending: Cardiology | Admitting: Cardiology

## 2012-05-30 ENCOUNTER — Encounter (HOSPITAL_COMMUNITY): Payer: Self-pay

## 2012-05-30 ENCOUNTER — Encounter (HOSPITAL_COMMUNITY)
Admission: RE | Admit: 2012-05-30 | Discharge: 2012-05-30 | Disposition: A | Discharge status: Home / Self Care | Payer: Self-pay | Source: Ambulatory Visit | Attending: Cardiology | Admitting: Cardiology

## 2012-05-30 DIAGNOSIS — I2582 Chronic total occlusion of coronary artery: Secondary | ICD-10-CM | POA: Insufficient documentation

## 2012-05-30 DIAGNOSIS — K219 Gastro-esophageal reflux disease without esophagitis: Secondary | ICD-10-CM | POA: Insufficient documentation

## 2012-05-30 DIAGNOSIS — E039 Hypothyroidism, unspecified: Secondary | ICD-10-CM | POA: Insufficient documentation

## 2012-05-30 DIAGNOSIS — Z9861 Coronary angioplasty status: Secondary | ICD-10-CM | POA: Insufficient documentation

## 2012-05-30 DIAGNOSIS — E785 Hyperlipidemia, unspecified: Secondary | ICD-10-CM | POA: Insufficient documentation

## 2012-05-30 DIAGNOSIS — I252 Old myocardial infarction: Secondary | ICD-10-CM | POA: Insufficient documentation

## 2012-05-30 DIAGNOSIS — Z5189 Encounter for other specified aftercare: Secondary | ICD-10-CM | POA: Insufficient documentation

## 2012-05-30 DIAGNOSIS — Z7982 Long term (current) use of aspirin: Secondary | ICD-10-CM | POA: Insufficient documentation

## 2012-05-30 DIAGNOSIS — I2510 Atherosclerotic heart disease of native coronary artery without angina pectoris: Secondary | ICD-10-CM | POA: Insufficient documentation

## 2012-06-01 ENCOUNTER — Encounter (HOSPITAL_COMMUNITY): Payer: Self-pay

## 2012-06-01 ENCOUNTER — Encounter (HOSPITAL_COMMUNITY)
Admission: RE | Admit: 2012-06-01 | Discharge: 2012-06-01 | Disposition: A | Discharge status: Home / Self Care | Payer: Self-pay | Source: Ambulatory Visit | Attending: Cardiology | Admitting: Cardiology

## 2012-06-03 ENCOUNTER — Encounter (HOSPITAL_COMMUNITY): Payer: Self-pay

## 2012-06-03 ENCOUNTER — Encounter (HOSPITAL_COMMUNITY)
Admission: RE | Admit: 2012-06-03 | Discharge: 2012-06-03 | Disposition: A | Discharge status: Home / Self Care | Payer: Self-pay | Source: Ambulatory Visit | Attending: Cardiology | Admitting: Cardiology

## 2012-06-06 ENCOUNTER — Encounter (HOSPITAL_COMMUNITY)
Admission: RE | Admit: 2012-06-06 | Discharge: 2012-06-06 | Disposition: A | Discharge status: Home / Self Care | Payer: Self-pay | Source: Ambulatory Visit | Attending: Cardiology | Admitting: Cardiology

## 2012-06-06 ENCOUNTER — Encounter (HOSPITAL_COMMUNITY): Payer: Self-pay

## 2012-06-08 ENCOUNTER — Encounter (HOSPITAL_COMMUNITY)
Admission: RE | Admit: 2012-06-08 | Discharge: 2012-06-08 | Disposition: A | Discharge status: Home / Self Care | Payer: Self-pay | Source: Ambulatory Visit | Attending: Cardiology | Admitting: Cardiology

## 2012-06-08 ENCOUNTER — Encounter (HOSPITAL_COMMUNITY): Payer: Self-pay

## 2012-06-10 ENCOUNTER — Encounter (HOSPITAL_COMMUNITY): Payer: Self-pay

## 2012-06-10 ENCOUNTER — Encounter (HOSPITAL_COMMUNITY)
Admission: RE | Admit: 2012-06-10 | Discharge: 2012-06-10 | Disposition: A | Discharge status: Home / Self Care | Payer: Self-pay | Source: Ambulatory Visit | Attending: Cardiology | Admitting: Cardiology

## 2012-06-13 ENCOUNTER — Encounter (HOSPITAL_COMMUNITY)
Admission: RE | Admit: 2012-06-13 | Discharge: 2012-06-13 | Disposition: A | Discharge status: Home / Self Care | Payer: Self-pay | Source: Ambulatory Visit | Attending: Cardiology | Admitting: Cardiology

## 2012-06-13 ENCOUNTER — Encounter (HOSPITAL_COMMUNITY): Payer: Self-pay

## 2012-06-15 ENCOUNTER — Encounter (HOSPITAL_COMMUNITY): Payer: Self-pay

## 2012-06-17 ENCOUNTER — Encounter (HOSPITAL_COMMUNITY): Payer: Self-pay

## 2012-06-20 ENCOUNTER — Encounter (HOSPITAL_COMMUNITY): Payer: Self-pay

## 2012-06-22 ENCOUNTER — Encounter (HOSPITAL_COMMUNITY): Payer: Self-pay

## 2012-06-22 ENCOUNTER — Encounter (HOSPITAL_COMMUNITY)
Admission: RE | Admit: 2012-06-22 | Discharge: 2012-06-22 | Disposition: A | Discharge status: Home / Self Care | Payer: Self-pay | Source: Ambulatory Visit | Attending: Cardiology | Admitting: Cardiology

## 2012-06-24 ENCOUNTER — Encounter (HOSPITAL_COMMUNITY): Payer: Self-pay

## 2012-06-24 ENCOUNTER — Encounter (HOSPITAL_COMMUNITY)
Admission: RE | Admit: 2012-06-24 | Discharge: 2012-06-24 | Disposition: A | Discharge status: Home / Self Care | Payer: Self-pay | Source: Ambulatory Visit | Attending: Cardiology | Admitting: Cardiology

## 2012-06-27 ENCOUNTER — Encounter (HOSPITAL_COMMUNITY): Payer: Self-pay

## 2012-06-29 ENCOUNTER — Encounter (HOSPITAL_COMMUNITY): Payer: Self-pay

## 2012-07-01 ENCOUNTER — Encounter (HOSPITAL_COMMUNITY): Payer: Self-pay

## 2012-07-01 DIAGNOSIS — Z5189 Encounter for other specified aftercare: Secondary | ICD-10-CM | POA: Insufficient documentation

## 2012-07-01 DIAGNOSIS — K219 Gastro-esophageal reflux disease without esophagitis: Secondary | ICD-10-CM | POA: Insufficient documentation

## 2012-07-01 DIAGNOSIS — I252 Old myocardial infarction: Secondary | ICD-10-CM | POA: Insufficient documentation

## 2012-07-01 DIAGNOSIS — E785 Hyperlipidemia, unspecified: Secondary | ICD-10-CM | POA: Insufficient documentation

## 2012-07-01 DIAGNOSIS — E039 Hypothyroidism, unspecified: Secondary | ICD-10-CM | POA: Insufficient documentation

## 2012-07-01 DIAGNOSIS — Z9861 Coronary angioplasty status: Secondary | ICD-10-CM | POA: Insufficient documentation

## 2012-07-01 DIAGNOSIS — I2510 Atherosclerotic heart disease of native coronary artery without angina pectoris: Secondary | ICD-10-CM | POA: Insufficient documentation

## 2012-07-01 DIAGNOSIS — Z7982 Long term (current) use of aspirin: Secondary | ICD-10-CM | POA: Insufficient documentation

## 2012-07-01 DIAGNOSIS — I2582 Chronic total occlusion of coronary artery: Secondary | ICD-10-CM | POA: Insufficient documentation

## 2012-07-04 ENCOUNTER — Encounter (HOSPITAL_COMMUNITY): Payer: Self-pay

## 2012-07-06 ENCOUNTER — Encounter (HOSPITAL_COMMUNITY)
Admission: RE | Admit: 2012-07-06 | Discharge: 2012-07-06 | Disposition: A | Discharge status: Home / Self Care | Payer: Self-pay | Source: Ambulatory Visit | Attending: Cardiology | Admitting: Cardiology

## 2012-07-06 ENCOUNTER — Encounter (HOSPITAL_COMMUNITY): Payer: Self-pay

## 2012-07-08 ENCOUNTER — Encounter (HOSPITAL_COMMUNITY): Payer: Self-pay

## 2012-07-08 ENCOUNTER — Encounter (HOSPITAL_COMMUNITY)
Admission: RE | Admit: 2012-07-08 | Discharge: 2012-07-08 | Disposition: A | Discharge status: Home / Self Care | Payer: Self-pay | Source: Ambulatory Visit | Attending: Cardiology | Admitting: Cardiology

## 2012-07-11 ENCOUNTER — Encounter (HOSPITAL_COMMUNITY): Payer: Self-pay

## 2012-07-11 ENCOUNTER — Encounter (HOSPITAL_COMMUNITY)
Admission: RE | Admit: 2012-07-11 | Discharge: 2012-07-11 | Disposition: A | Discharge status: Home / Self Care | Payer: Self-pay | Source: Ambulatory Visit | Attending: Cardiology | Admitting: Cardiology

## 2012-07-13 ENCOUNTER — Encounter (HOSPITAL_COMMUNITY): Payer: Self-pay

## 2012-07-13 ENCOUNTER — Encounter (HOSPITAL_COMMUNITY)
Admission: RE | Admit: 2012-07-13 | Discharge: 2012-07-13 | Disposition: A | Discharge status: Home / Self Care | Payer: Self-pay | Source: Ambulatory Visit | Attending: Cardiology | Admitting: Cardiology

## 2012-07-15 ENCOUNTER — Encounter (HOSPITAL_COMMUNITY): Payer: Self-pay

## 2012-07-15 ENCOUNTER — Encounter (HOSPITAL_COMMUNITY)
Admission: RE | Admit: 2012-07-15 | Discharge: 2012-07-15 | Disposition: A | Discharge status: Home / Self Care | Payer: Self-pay | Source: Ambulatory Visit | Attending: Cardiology | Admitting: Cardiology

## 2012-07-18 ENCOUNTER — Encounter (HOSPITAL_COMMUNITY)
Admission: RE | Admit: 2012-07-18 | Discharge: 2012-07-18 | Disposition: A | Discharge status: Home / Self Care | Payer: Self-pay | Source: Ambulatory Visit | Attending: Cardiology | Admitting: Cardiology

## 2012-07-18 ENCOUNTER — Encounter (HOSPITAL_COMMUNITY): Payer: Self-pay

## 2012-07-20 ENCOUNTER — Encounter (HOSPITAL_COMMUNITY): Payer: Self-pay

## 2012-07-20 ENCOUNTER — Encounter (HOSPITAL_COMMUNITY)
Admission: RE | Admit: 2012-07-20 | Discharge: 2012-07-20 | Disposition: A | Discharge status: Home / Self Care | Payer: Self-pay | Source: Ambulatory Visit | Attending: Cardiology | Admitting: Cardiology

## 2012-07-22 ENCOUNTER — Encounter (HOSPITAL_COMMUNITY)
Admission: RE | Admit: 2012-07-22 | Discharge: 2012-07-22 | Disposition: A | Discharge status: Home / Self Care | Payer: Self-pay | Source: Ambulatory Visit | Attending: Cardiology | Admitting: Cardiology

## 2012-07-22 ENCOUNTER — Encounter (HOSPITAL_COMMUNITY): Payer: Self-pay

## 2012-07-25 ENCOUNTER — Encounter (HOSPITAL_COMMUNITY): Payer: Self-pay

## 2012-07-27 ENCOUNTER — Encounter (HOSPITAL_COMMUNITY): Payer: Self-pay

## 2012-07-27 ENCOUNTER — Encounter (HOSPITAL_COMMUNITY)
Admission: RE | Admit: 2012-07-27 | Discharge: 2012-07-27 | Disposition: A | Discharge status: Home / Self Care | Payer: Self-pay | Source: Ambulatory Visit | Attending: Cardiology | Admitting: Cardiology

## 2012-07-29 ENCOUNTER — Encounter (HOSPITAL_COMMUNITY)
Admission: RE | Admit: 2012-07-29 | Discharge: 2012-07-29 | Disposition: A | Discharge status: Home / Self Care | Payer: Self-pay | Source: Ambulatory Visit | Attending: Cardiology | Admitting: Cardiology

## 2012-07-29 ENCOUNTER — Encounter (HOSPITAL_COMMUNITY): Payer: Self-pay

## 2012-08-01 ENCOUNTER — Encounter (HOSPITAL_COMMUNITY): Payer: Self-pay

## 2012-08-03 ENCOUNTER — Encounter (HOSPITAL_COMMUNITY): Payer: Self-pay

## 2012-08-03 DIAGNOSIS — Z5189 Encounter for other specified aftercare: Secondary | ICD-10-CM | POA: Insufficient documentation

## 2012-08-03 DIAGNOSIS — E785 Hyperlipidemia, unspecified: Secondary | ICD-10-CM | POA: Insufficient documentation

## 2012-08-03 DIAGNOSIS — E039 Hypothyroidism, unspecified: Secondary | ICD-10-CM | POA: Insufficient documentation

## 2012-08-03 DIAGNOSIS — I252 Old myocardial infarction: Secondary | ICD-10-CM | POA: Insufficient documentation

## 2012-08-03 DIAGNOSIS — Z7982 Long term (current) use of aspirin: Secondary | ICD-10-CM | POA: Insufficient documentation

## 2012-08-03 DIAGNOSIS — Z9861 Coronary angioplasty status: Secondary | ICD-10-CM | POA: Insufficient documentation

## 2012-08-03 DIAGNOSIS — I2510 Atherosclerotic heart disease of native coronary artery without angina pectoris: Secondary | ICD-10-CM | POA: Insufficient documentation

## 2012-08-03 DIAGNOSIS — I2582 Chronic total occlusion of coronary artery: Secondary | ICD-10-CM | POA: Insufficient documentation

## 2012-08-03 DIAGNOSIS — K219 Gastro-esophageal reflux disease without esophagitis: Secondary | ICD-10-CM | POA: Insufficient documentation

## 2012-08-05 ENCOUNTER — Encounter (HOSPITAL_COMMUNITY): Payer: Self-pay

## 2012-08-05 ENCOUNTER — Encounter (HOSPITAL_COMMUNITY)
Admission: RE | Admit: 2012-08-05 | Discharge: 2012-08-05 | Disposition: A | Discharge status: Home / Self Care | Payer: Self-pay | Source: Ambulatory Visit | Attending: Cardiology | Admitting: Cardiology

## 2012-08-08 ENCOUNTER — Encounter (HOSPITAL_COMMUNITY)
Admission: RE | Admit: 2012-08-08 | Discharge: 2012-08-08 | Disposition: A | Discharge status: Home / Self Care | Payer: Self-pay | Source: Ambulatory Visit | Attending: Cardiology | Admitting: Cardiology

## 2012-08-08 ENCOUNTER — Encounter (HOSPITAL_COMMUNITY): Payer: Self-pay

## 2012-08-10 ENCOUNTER — Encounter (HOSPITAL_COMMUNITY)
Admission: RE | Admit: 2012-08-10 | Discharge: 2012-08-10 | Disposition: A | Discharge status: Home / Self Care | Payer: Self-pay | Source: Ambulatory Visit | Attending: Cardiology | Admitting: Cardiology

## 2012-08-10 ENCOUNTER — Encounter (HOSPITAL_COMMUNITY): Payer: Self-pay

## 2012-08-12 ENCOUNTER — Encounter (HOSPITAL_COMMUNITY): Payer: Self-pay

## 2012-08-12 ENCOUNTER — Encounter (HOSPITAL_COMMUNITY)
Admission: RE | Admit: 2012-08-12 | Discharge: 2012-08-12 | Disposition: A | Discharge status: Home / Self Care | Payer: Self-pay | Source: Ambulatory Visit | Attending: Cardiology | Admitting: Cardiology

## 2012-08-15 ENCOUNTER — Encounter (HOSPITAL_COMMUNITY)
Admission: RE | Admit: 2012-08-15 | Discharge: 2012-08-15 | Disposition: A | Discharge status: Home / Self Care | Payer: Self-pay | Source: Ambulatory Visit | Attending: Cardiology | Admitting: Cardiology

## 2012-08-15 ENCOUNTER — Encounter (HOSPITAL_COMMUNITY): Payer: Self-pay

## 2012-08-17 ENCOUNTER — Encounter (HOSPITAL_COMMUNITY)
Admission: RE | Admit: 2012-08-17 | Discharge: 2012-08-17 | Disposition: A | Discharge status: Home / Self Care | Payer: Self-pay | Source: Ambulatory Visit | Attending: Cardiology | Admitting: Cardiology

## 2012-08-17 ENCOUNTER — Encounter (HOSPITAL_COMMUNITY): Payer: Self-pay

## 2012-08-19 ENCOUNTER — Encounter (HOSPITAL_COMMUNITY): Payer: Self-pay

## 2012-08-19 ENCOUNTER — Encounter (HOSPITAL_COMMUNITY)
Admission: RE | Admit: 2012-08-19 | Discharge: 2012-08-19 | Disposition: A | Discharge status: Home / Self Care | Payer: Self-pay | Source: Ambulatory Visit | Attending: Cardiology | Admitting: Cardiology

## 2012-08-22 ENCOUNTER — Encounter (HOSPITAL_COMMUNITY)
Admission: RE | Admit: 2012-08-22 | Discharge: 2012-08-22 | Disposition: A | Discharge status: Home / Self Care | Payer: Self-pay | Source: Ambulatory Visit | Attending: Cardiology | Admitting: Cardiology

## 2012-08-22 ENCOUNTER — Encounter (HOSPITAL_COMMUNITY): Payer: Self-pay

## 2012-08-23 DIAGNOSIS — Z23 Encounter for immunization: Secondary | ICD-10-CM | POA: Diagnosis not present

## 2012-08-24 ENCOUNTER — Encounter (HOSPITAL_COMMUNITY)
Admission: RE | Admit: 2012-08-24 | Discharge: 2012-08-24 | Disposition: A | Discharge status: Home / Self Care | Payer: Self-pay | Source: Ambulatory Visit | Attending: Cardiology | Admitting: Cardiology

## 2012-08-24 ENCOUNTER — Encounter (HOSPITAL_COMMUNITY): Payer: Self-pay

## 2012-08-26 ENCOUNTER — Encounter (HOSPITAL_COMMUNITY)
Admission: RE | Admit: 2012-08-26 | Discharge: 2012-08-26 | Disposition: A | Discharge status: Home / Self Care | Payer: Self-pay | Source: Ambulatory Visit | Attending: Cardiology | Admitting: Cardiology

## 2012-08-26 ENCOUNTER — Encounter (HOSPITAL_COMMUNITY): Payer: Self-pay

## 2012-08-26 ENCOUNTER — Other Ambulatory Visit: Payer: Self-pay | Admitting: Cardiology

## 2012-08-29 ENCOUNTER — Encounter (HOSPITAL_COMMUNITY): Payer: Self-pay

## 2012-08-29 ENCOUNTER — Encounter (HOSPITAL_COMMUNITY)
Admission: RE | Admit: 2012-08-29 | Discharge: 2012-08-29 | Disposition: A | Discharge status: Home / Self Care | Payer: Self-pay | Source: Ambulatory Visit | Attending: Cardiology | Admitting: Cardiology

## 2012-08-31 ENCOUNTER — Encounter (HOSPITAL_COMMUNITY)
Admission: RE | Admit: 2012-08-31 | Discharge: 2012-08-31 | Disposition: A | Discharge status: Home / Self Care | Payer: Self-pay | Source: Ambulatory Visit | Attending: Cardiology | Admitting: Cardiology

## 2012-08-31 ENCOUNTER — Encounter (HOSPITAL_COMMUNITY): Payer: Self-pay

## 2012-08-31 DIAGNOSIS — I2582 Chronic total occlusion of coronary artery: Secondary | ICD-10-CM | POA: Insufficient documentation

## 2012-08-31 DIAGNOSIS — Z7982 Long term (current) use of aspirin: Secondary | ICD-10-CM | POA: Insufficient documentation

## 2012-08-31 DIAGNOSIS — E785 Hyperlipidemia, unspecified: Secondary | ICD-10-CM | POA: Insufficient documentation

## 2012-08-31 DIAGNOSIS — E039 Hypothyroidism, unspecified: Secondary | ICD-10-CM | POA: Insufficient documentation

## 2012-08-31 DIAGNOSIS — I2510 Atherosclerotic heart disease of native coronary artery without angina pectoris: Secondary | ICD-10-CM | POA: Insufficient documentation

## 2012-08-31 DIAGNOSIS — Z5189 Encounter for other specified aftercare: Secondary | ICD-10-CM | POA: Insufficient documentation

## 2012-08-31 DIAGNOSIS — K219 Gastro-esophageal reflux disease without esophagitis: Secondary | ICD-10-CM | POA: Insufficient documentation

## 2012-08-31 DIAGNOSIS — Z9861 Coronary angioplasty status: Secondary | ICD-10-CM | POA: Insufficient documentation

## 2012-08-31 DIAGNOSIS — I252 Old myocardial infarction: Secondary | ICD-10-CM | POA: Insufficient documentation

## 2012-09-02 ENCOUNTER — Encounter (HOSPITAL_COMMUNITY): Payer: Self-pay

## 2012-09-05 ENCOUNTER — Encounter (HOSPITAL_COMMUNITY): Payer: Self-pay

## 2012-09-07 ENCOUNTER — Encounter (HOSPITAL_COMMUNITY): Payer: Self-pay

## 2012-09-09 ENCOUNTER — Encounter (HOSPITAL_COMMUNITY): Payer: Self-pay

## 2012-09-09 ENCOUNTER — Encounter (HOSPITAL_COMMUNITY)
Admission: RE | Admit: 2012-09-09 | Discharge: 2012-09-09 | Disposition: A | Discharge status: Home / Self Care | Payer: Self-pay | Source: Ambulatory Visit | Attending: Cardiology | Admitting: Cardiology

## 2012-09-12 ENCOUNTER — Encounter (HOSPITAL_COMMUNITY)
Admission: RE | Admit: 2012-09-12 | Discharge: 2012-09-12 | Disposition: A | Discharge status: Home / Self Care | Payer: Self-pay | Source: Ambulatory Visit | Attending: Cardiology | Admitting: Cardiology

## 2012-09-12 ENCOUNTER — Encounter (HOSPITAL_COMMUNITY): Payer: Self-pay

## 2012-09-14 ENCOUNTER — Encounter (HOSPITAL_COMMUNITY): Payer: Self-pay

## 2012-09-14 ENCOUNTER — Encounter (HOSPITAL_COMMUNITY)
Admission: RE | Admit: 2012-09-14 | Discharge: 2012-09-14 | Disposition: A | Discharge status: Home / Self Care | Payer: Self-pay | Source: Ambulatory Visit | Attending: Cardiology | Admitting: Cardiology

## 2012-09-16 ENCOUNTER — Encounter (HOSPITAL_COMMUNITY): Payer: Self-pay

## 2012-09-16 ENCOUNTER — Encounter (HOSPITAL_COMMUNITY)
Admission: RE | Admit: 2012-09-16 | Discharge: 2012-09-16 | Disposition: A | Discharge status: Home / Self Care | Payer: Self-pay | Source: Ambulatory Visit | Attending: Cardiology | Admitting: Cardiology

## 2012-09-19 ENCOUNTER — Encounter (HOSPITAL_COMMUNITY)
Admission: RE | Admit: 2012-09-19 | Discharge: 2012-09-19 | Disposition: A | Discharge status: Home / Self Care | Payer: Self-pay | Source: Ambulatory Visit | Attending: Cardiology | Admitting: Cardiology

## 2012-09-19 ENCOUNTER — Encounter (HOSPITAL_COMMUNITY): Payer: Self-pay

## 2012-09-21 ENCOUNTER — Encounter (HOSPITAL_COMMUNITY): Payer: Self-pay

## 2012-09-23 ENCOUNTER — Encounter (HOSPITAL_COMMUNITY)
Admission: RE | Admit: 2012-09-23 | Discharge: 2012-09-23 | Disposition: A | Discharge status: Home / Self Care | Payer: Self-pay | Source: Ambulatory Visit | Attending: Cardiology | Admitting: Cardiology

## 2012-09-23 ENCOUNTER — Encounter (HOSPITAL_COMMUNITY): Payer: Self-pay

## 2012-09-26 ENCOUNTER — Encounter (HOSPITAL_COMMUNITY): Payer: Self-pay

## 2012-09-26 ENCOUNTER — Encounter (HOSPITAL_COMMUNITY)
Admission: RE | Admit: 2012-09-26 | Discharge: 2012-09-26 | Disposition: A | Discharge status: Home / Self Care | Payer: Self-pay | Source: Ambulatory Visit | Attending: Cardiology | Admitting: Cardiology

## 2012-09-28 ENCOUNTER — Encounter (HOSPITAL_COMMUNITY): Payer: Self-pay

## 2012-09-28 ENCOUNTER — Encounter (HOSPITAL_COMMUNITY)
Admission: RE | Admit: 2012-09-28 | Discharge: 2012-09-28 | Disposition: A | Discharge status: Home / Self Care | Payer: Self-pay | Source: Ambulatory Visit | Attending: Cardiology | Admitting: Cardiology

## 2012-09-30 ENCOUNTER — Encounter (HOSPITAL_COMMUNITY)
Admission: RE | Admit: 2012-09-30 | Discharge: 2012-09-30 | Disposition: A | Discharge status: Home / Self Care | Payer: Self-pay | Source: Ambulatory Visit | Attending: Cardiology | Admitting: Cardiology

## 2012-09-30 DIAGNOSIS — Z7982 Long term (current) use of aspirin: Secondary | ICD-10-CM | POA: Insufficient documentation

## 2012-09-30 DIAGNOSIS — I252 Old myocardial infarction: Secondary | ICD-10-CM | POA: Insufficient documentation

## 2012-09-30 DIAGNOSIS — E039 Hypothyroidism, unspecified: Secondary | ICD-10-CM | POA: Insufficient documentation

## 2012-09-30 DIAGNOSIS — M19049 Primary osteoarthritis, unspecified hand: Secondary | ICD-10-CM | POA: Diagnosis not present

## 2012-09-30 DIAGNOSIS — E785 Hyperlipidemia, unspecified: Secondary | ICD-10-CM | POA: Insufficient documentation

## 2012-09-30 DIAGNOSIS — I2510 Atherosclerotic heart disease of native coronary artery without angina pectoris: Secondary | ICD-10-CM | POA: Insufficient documentation

## 2012-09-30 DIAGNOSIS — Z9861 Coronary angioplasty status: Secondary | ICD-10-CM | POA: Insufficient documentation

## 2012-09-30 DIAGNOSIS — K219 Gastro-esophageal reflux disease without esophagitis: Secondary | ICD-10-CM | POA: Insufficient documentation

## 2012-09-30 DIAGNOSIS — Z5189 Encounter for other specified aftercare: Secondary | ICD-10-CM | POA: Insufficient documentation

## 2012-09-30 DIAGNOSIS — I2582 Chronic total occlusion of coronary artery: Secondary | ICD-10-CM | POA: Insufficient documentation

## 2012-10-03 ENCOUNTER — Encounter (HOSPITAL_COMMUNITY)
Admission: RE | Admit: 2012-10-03 | Discharge: 2012-10-03 | Disposition: A | Discharge status: Home / Self Care | Payer: Self-pay | Source: Ambulatory Visit | Attending: Cardiology | Admitting: Cardiology

## 2012-10-05 ENCOUNTER — Encounter (HOSPITAL_COMMUNITY)
Admission: RE | Admit: 2012-10-05 | Discharge: 2012-10-05 | Disposition: A | Discharge status: Home / Self Care | Payer: Self-pay | Source: Ambulatory Visit | Attending: Cardiology | Admitting: Cardiology

## 2012-10-06 DIAGNOSIS — M19049 Primary osteoarthritis, unspecified hand: Secondary | ICD-10-CM | POA: Diagnosis not present

## 2012-10-07 ENCOUNTER — Encounter (HOSPITAL_COMMUNITY)
Admission: RE | Admit: 2012-10-07 | Discharge: 2012-10-07 | Disposition: A | Discharge status: Home / Self Care | Payer: Self-pay | Source: Ambulatory Visit | Attending: Cardiology | Admitting: Cardiology

## 2012-10-10 ENCOUNTER — Encounter (HOSPITAL_COMMUNITY)
Admission: RE | Admit: 2012-10-10 | Discharge: 2012-10-10 | Disposition: A | Discharge status: Home / Self Care | Payer: Self-pay | Source: Ambulatory Visit | Attending: Cardiology | Admitting: Cardiology

## 2012-10-12 ENCOUNTER — Encounter (HOSPITAL_COMMUNITY)
Admission: RE | Admit: 2012-10-12 | Discharge: 2012-10-12 | Disposition: A | Discharge status: Home / Self Care | Payer: Self-pay | Source: Ambulatory Visit | Attending: Cardiology | Admitting: Cardiology

## 2012-10-14 ENCOUNTER — Encounter (HOSPITAL_COMMUNITY)
Admission: RE | Admit: 2012-10-14 | Discharge: 2012-10-14 | Disposition: A | Discharge status: Home / Self Care | Payer: Self-pay | Source: Ambulatory Visit | Attending: Cardiology | Admitting: Cardiology

## 2012-10-17 ENCOUNTER — Encounter (HOSPITAL_COMMUNITY)
Admission: RE | Admit: 2012-10-17 | Discharge: 2012-10-17 | Disposition: A | Discharge status: Home / Self Care | Payer: Self-pay | Source: Ambulatory Visit | Attending: Cardiology | Admitting: Cardiology

## 2012-10-19 ENCOUNTER — Other Ambulatory Visit (HOSPITAL_COMMUNITY): Payer: Self-pay | Admitting: *Deleted

## 2012-10-19 ENCOUNTER — Encounter (HOSPITAL_COMMUNITY)
Admission: RE | Admit: 2012-10-19 | Discharge: 2012-10-19 | Disposition: A | Discharge status: Home / Self Care | Payer: Self-pay | Source: Ambulatory Visit | Attending: Cardiology | Admitting: Cardiology

## 2012-10-19 NOTE — Progress Notes (Signed)
Pt remarked that his heart rate was 48.  Confirmed by rehab staff.  bp 138/80 and pt denies any complaint.  Pt placed on monitor showed HR in the upper 40's and 50's sinus brady.  Will in basket strips to Dr. Swaziland to review.

## 2012-10-21 ENCOUNTER — Encounter (HOSPITAL_COMMUNITY)
Admission: RE | Admit: 2012-10-21 | Discharge: 2012-10-21 | Disposition: A | Discharge status: Home / Self Care | Payer: Self-pay | Source: Ambulatory Visit | Attending: Cardiology | Admitting: Cardiology

## 2012-10-24 ENCOUNTER — Encounter (HOSPITAL_COMMUNITY)
Admission: RE | Admit: 2012-10-24 | Discharge: 2012-10-24 | Disposition: A | Discharge status: Home / Self Care | Payer: Self-pay | Source: Ambulatory Visit | Attending: Cardiology | Admitting: Cardiology

## 2012-10-26 ENCOUNTER — Encounter (HOSPITAL_COMMUNITY)
Admission: RE | Admit: 2012-10-26 | Discharge: 2012-10-26 | Disposition: A | Discharge status: Home / Self Care | Payer: Self-pay | Source: Ambulatory Visit | Attending: Cardiology | Admitting: Cardiology

## 2012-10-31 ENCOUNTER — Encounter (HOSPITAL_COMMUNITY)
Admission: RE | Admit: 2012-10-31 | Discharge: 2012-10-31 | Disposition: A | Discharge status: Home / Self Care | Payer: Self-pay | Source: Ambulatory Visit | Attending: Cardiology | Admitting: Cardiology

## 2012-10-31 DIAGNOSIS — Z5189 Encounter for other specified aftercare: Secondary | ICD-10-CM | POA: Insufficient documentation

## 2012-10-31 DIAGNOSIS — E039 Hypothyroidism, unspecified: Secondary | ICD-10-CM | POA: Insufficient documentation

## 2012-10-31 DIAGNOSIS — I2510 Atherosclerotic heart disease of native coronary artery without angina pectoris: Secondary | ICD-10-CM | POA: Insufficient documentation

## 2012-10-31 DIAGNOSIS — Z9861 Coronary angioplasty status: Secondary | ICD-10-CM | POA: Insufficient documentation

## 2012-10-31 DIAGNOSIS — I2582 Chronic total occlusion of coronary artery: Secondary | ICD-10-CM | POA: Insufficient documentation

## 2012-10-31 DIAGNOSIS — I252 Old myocardial infarction: Secondary | ICD-10-CM | POA: Insufficient documentation

## 2012-10-31 DIAGNOSIS — Z7982 Long term (current) use of aspirin: Secondary | ICD-10-CM | POA: Insufficient documentation

## 2012-10-31 DIAGNOSIS — E785 Hyperlipidemia, unspecified: Secondary | ICD-10-CM | POA: Insufficient documentation

## 2012-10-31 DIAGNOSIS — K219 Gastro-esophageal reflux disease without esophagitis: Secondary | ICD-10-CM | POA: Insufficient documentation

## 2012-11-02 ENCOUNTER — Encounter (HOSPITAL_COMMUNITY)
Admission: RE | Admit: 2012-11-02 | Discharge: 2012-11-02 | Disposition: A | Discharge status: Home / Self Care | Payer: Self-pay | Source: Ambulatory Visit | Attending: Cardiology | Admitting: Cardiology

## 2012-11-04 ENCOUNTER — Encounter (HOSPITAL_COMMUNITY): Payer: Self-pay

## 2012-11-07 ENCOUNTER — Encounter (HOSPITAL_COMMUNITY)
Admission: RE | Admit: 2012-11-07 | Discharge: 2012-11-07 | Disposition: A | Discharge status: Home / Self Care | Payer: Self-pay | Source: Ambulatory Visit | Attending: Cardiology | Admitting: Cardiology

## 2012-11-07 DIAGNOSIS — L821 Other seborrheic keratosis: Secondary | ICD-10-CM | POA: Diagnosis not present

## 2012-11-07 DIAGNOSIS — L570 Actinic keratosis: Secondary | ICD-10-CM | POA: Diagnosis not present

## 2012-11-09 ENCOUNTER — Encounter (HOSPITAL_COMMUNITY)
Admission: RE | Admit: 2012-11-09 | Discharge: 2012-11-09 | Disposition: A | Discharge status: Home / Self Care | Payer: Self-pay | Source: Ambulatory Visit | Attending: Cardiology | Admitting: Cardiology

## 2012-11-10 ENCOUNTER — Other Ambulatory Visit: Payer: Self-pay | Admitting: *Deleted

## 2012-11-10 MED ORDER — ROSUVASTATIN CALCIUM 10 MG PO TABS: 10.0000 mg | ORAL_TABLET | Freq: Every day | ORAL | Status: DC

## 2012-11-11 ENCOUNTER — Encounter (HOSPITAL_COMMUNITY)
Admission: RE | Admit: 2012-11-11 | Discharge: 2012-11-11 | Disposition: A | Discharge status: Home / Self Care | Payer: Self-pay | Source: Ambulatory Visit | Attending: Cardiology | Admitting: Cardiology

## 2012-11-14 ENCOUNTER — Encounter (HOSPITAL_COMMUNITY): Payer: Self-pay

## 2012-11-16 ENCOUNTER — Encounter (HOSPITAL_COMMUNITY)
Admission: RE | Admit: 2012-11-16 | Discharge: 2012-11-16 | Disposition: A | Discharge status: Home / Self Care | Payer: Self-pay | Source: Ambulatory Visit | Attending: Cardiology | Admitting: Cardiology

## 2012-11-18 ENCOUNTER — Encounter (HOSPITAL_COMMUNITY): Payer: Self-pay

## 2012-11-21 ENCOUNTER — Encounter (HOSPITAL_COMMUNITY)
Admission: RE | Admit: 2012-11-21 | Discharge: 2012-11-21 | Disposition: A | Discharge status: Home / Self Care | Payer: Self-pay | Source: Ambulatory Visit | Attending: Cardiology | Admitting: Cardiology

## 2012-11-25 ENCOUNTER — Encounter (HOSPITAL_COMMUNITY): Payer: Self-pay

## 2012-11-28 ENCOUNTER — Encounter (HOSPITAL_COMMUNITY): Payer: Self-pay

## 2012-12-02 ENCOUNTER — Encounter (HOSPITAL_COMMUNITY)
Admission: RE | Admit: 2012-12-02 | Discharge: 2012-12-02 | Disposition: A | Discharge status: Home / Self Care | Payer: Self-pay | Source: Ambulatory Visit | Attending: Cardiology | Admitting: Cardiology

## 2012-12-02 DIAGNOSIS — I2510 Atherosclerotic heart disease of native coronary artery without angina pectoris: Secondary | ICD-10-CM | POA: Insufficient documentation

## 2012-12-02 DIAGNOSIS — I252 Old myocardial infarction: Secondary | ICD-10-CM | POA: Insufficient documentation

## 2012-12-02 DIAGNOSIS — Z7982 Long term (current) use of aspirin: Secondary | ICD-10-CM | POA: Insufficient documentation

## 2012-12-02 DIAGNOSIS — Z9861 Coronary angioplasty status: Secondary | ICD-10-CM | POA: Insufficient documentation

## 2012-12-02 DIAGNOSIS — E039 Hypothyroidism, unspecified: Secondary | ICD-10-CM | POA: Insufficient documentation

## 2012-12-02 DIAGNOSIS — E785 Hyperlipidemia, unspecified: Secondary | ICD-10-CM | POA: Insufficient documentation

## 2012-12-02 DIAGNOSIS — Z5189 Encounter for other specified aftercare: Secondary | ICD-10-CM | POA: Insufficient documentation

## 2012-12-02 DIAGNOSIS — K219 Gastro-esophageal reflux disease without esophagitis: Secondary | ICD-10-CM | POA: Insufficient documentation

## 2012-12-02 DIAGNOSIS — I2582 Chronic total occlusion of coronary artery: Secondary | ICD-10-CM | POA: Insufficient documentation

## 2012-12-05 ENCOUNTER — Encounter (HOSPITAL_COMMUNITY)
Admission: RE | Admit: 2012-12-05 | Discharge: 2012-12-05 | Disposition: A | Discharge status: Home / Self Care | Payer: Self-pay | Source: Ambulatory Visit | Attending: Cardiology | Admitting: Cardiology

## 2012-12-07 ENCOUNTER — Encounter (HOSPITAL_COMMUNITY)
Admission: RE | Admit: 2012-12-07 | Discharge: 2012-12-07 | Disposition: A | Discharge status: Home / Self Care | Payer: Self-pay | Source: Ambulatory Visit | Attending: Cardiology | Admitting: Cardiology

## 2012-12-09 ENCOUNTER — Encounter (HOSPITAL_COMMUNITY): Payer: Self-pay

## 2012-12-12 ENCOUNTER — Encounter (HOSPITAL_COMMUNITY): Payer: Self-pay

## 2012-12-13 DIAGNOSIS — M19049 Primary osteoarthritis, unspecified hand: Secondary | ICD-10-CM | POA: Diagnosis not present

## 2012-12-14 ENCOUNTER — Encounter (HOSPITAL_COMMUNITY)
Admission: RE | Admit: 2012-12-14 | Discharge: 2012-12-14 | Disposition: A | Discharge status: Home / Self Care | Payer: Self-pay | Source: Ambulatory Visit | Attending: Cardiology | Admitting: Cardiology

## 2012-12-16 ENCOUNTER — Encounter (HOSPITAL_COMMUNITY)
Admission: RE | Admit: 2012-12-16 | Discharge: 2012-12-16 | Disposition: A | Discharge status: Home / Self Care | Payer: Self-pay | Source: Ambulatory Visit | Attending: Cardiology | Admitting: Cardiology

## 2012-12-19 ENCOUNTER — Encounter (HOSPITAL_COMMUNITY)
Admission: RE | Admit: 2012-12-19 | Discharge: 2012-12-19 | Disposition: A | Discharge status: Home / Self Care | Payer: Self-pay | Source: Ambulatory Visit | Attending: Cardiology | Admitting: Cardiology

## 2012-12-21 ENCOUNTER — Encounter (HOSPITAL_COMMUNITY)
Admission: RE | Admit: 2012-12-21 | Discharge: 2012-12-21 | Disposition: A | Discharge status: Home / Self Care | Payer: Self-pay | Source: Ambulatory Visit | Attending: Cardiology | Admitting: Cardiology

## 2012-12-23 ENCOUNTER — Encounter (HOSPITAL_COMMUNITY)
Admission: RE | Admit: 2012-12-23 | Discharge: 2012-12-23 | Disposition: A | Discharge status: Home / Self Care | Payer: Self-pay | Source: Ambulatory Visit | Attending: Cardiology | Admitting: Cardiology

## 2012-12-26 ENCOUNTER — Encounter (HOSPITAL_COMMUNITY)
Admission: RE | Admit: 2012-12-26 | Discharge: 2012-12-26 | Disposition: A | Discharge status: Home / Self Care | Payer: Self-pay | Source: Ambulatory Visit | Attending: Cardiology | Admitting: Cardiology

## 2012-12-28 ENCOUNTER — Encounter (HOSPITAL_COMMUNITY): Payer: Self-pay

## 2012-12-30 ENCOUNTER — Encounter (HOSPITAL_COMMUNITY)
Admission: RE | Admit: 2012-12-30 | Discharge: 2012-12-30 | Disposition: A | Discharge status: Home / Self Care | Payer: Self-pay | Source: Ambulatory Visit | Attending: Cardiology | Admitting: Cardiology

## 2013-01-02 ENCOUNTER — Encounter (HOSPITAL_COMMUNITY)
Admission: RE | Admit: 2013-01-02 | Discharge: 2013-01-02 | Disposition: A | Discharge status: Home / Self Care | Payer: Self-pay | Source: Ambulatory Visit | Attending: Cardiology | Admitting: Cardiology

## 2013-01-02 DIAGNOSIS — Z7982 Long term (current) use of aspirin: Secondary | ICD-10-CM | POA: Insufficient documentation

## 2013-01-02 DIAGNOSIS — I2510 Atherosclerotic heart disease of native coronary artery without angina pectoris: Secondary | ICD-10-CM | POA: Insufficient documentation

## 2013-01-02 DIAGNOSIS — Z5189 Encounter for other specified aftercare: Secondary | ICD-10-CM | POA: Insufficient documentation

## 2013-01-02 DIAGNOSIS — E785 Hyperlipidemia, unspecified: Secondary | ICD-10-CM | POA: Insufficient documentation

## 2013-01-02 DIAGNOSIS — E039 Hypothyroidism, unspecified: Secondary | ICD-10-CM | POA: Insufficient documentation

## 2013-01-02 DIAGNOSIS — Z9861 Coronary angioplasty status: Secondary | ICD-10-CM | POA: Insufficient documentation

## 2013-01-02 DIAGNOSIS — I252 Old myocardial infarction: Secondary | ICD-10-CM | POA: Insufficient documentation

## 2013-01-02 DIAGNOSIS — K219 Gastro-esophageal reflux disease without esophagitis: Secondary | ICD-10-CM | POA: Insufficient documentation

## 2013-01-02 DIAGNOSIS — I2582 Chronic total occlusion of coronary artery: Secondary | ICD-10-CM | POA: Insufficient documentation

## 2013-01-04 ENCOUNTER — Encounter (HOSPITAL_COMMUNITY)
Admission: RE | Admit: 2013-01-04 | Discharge: 2013-01-04 | Disposition: A | Discharge status: Home / Self Care | Payer: Self-pay | Source: Ambulatory Visit | Attending: Cardiology | Admitting: Cardiology

## 2013-01-06 ENCOUNTER — Encounter (HOSPITAL_COMMUNITY)
Admission: RE | Admit: 2013-01-06 | Discharge: 2013-01-06 | Disposition: A | Discharge status: Home / Self Care | Payer: Self-pay | Source: Ambulatory Visit | Attending: Cardiology | Admitting: Cardiology

## 2013-01-09 ENCOUNTER — Encounter (HOSPITAL_COMMUNITY)
Admission: RE | Admit: 2013-01-09 | Discharge: 2013-01-09 | Disposition: A | Discharge status: Home / Self Care | Payer: Self-pay | Source: Ambulatory Visit | Attending: Cardiology | Admitting: Cardiology

## 2013-01-11 ENCOUNTER — Encounter (HOSPITAL_COMMUNITY): Payer: Self-pay

## 2013-01-13 ENCOUNTER — Encounter (HOSPITAL_COMMUNITY): Payer: Self-pay

## 2013-01-16 ENCOUNTER — Encounter (HOSPITAL_COMMUNITY)
Admission: RE | Admit: 2013-01-16 | Discharge: 2013-01-16 | Disposition: A | Discharge status: Home / Self Care | Payer: Self-pay | Source: Ambulatory Visit | Attending: Cardiology | Admitting: Cardiology

## 2013-01-18 ENCOUNTER — Encounter (HOSPITAL_COMMUNITY)
Admission: RE | Admit: 2013-01-18 | Discharge: 2013-01-18 | Disposition: A | Discharge status: Home / Self Care | Payer: Self-pay | Source: Ambulatory Visit | Attending: Cardiology | Admitting: Cardiology

## 2013-01-20 ENCOUNTER — Encounter (HOSPITAL_COMMUNITY)
Admission: RE | Admit: 2013-01-20 | Discharge: 2013-01-20 | Disposition: A | Discharge status: Home / Self Care | Payer: Self-pay | Source: Ambulatory Visit | Attending: Cardiology | Admitting: Cardiology

## 2013-01-23 ENCOUNTER — Encounter (HOSPITAL_COMMUNITY)
Admission: RE | Admit: 2013-01-23 | Discharge: 2013-01-23 | Disposition: A | Discharge status: Home / Self Care | Payer: Self-pay | Source: Ambulatory Visit | Attending: Cardiology | Admitting: Cardiology

## 2013-01-25 ENCOUNTER — Encounter (HOSPITAL_COMMUNITY)
Admission: RE | Admit: 2013-01-25 | Discharge: 2013-01-25 | Disposition: A | Discharge status: Home / Self Care | Payer: Self-pay | Source: Ambulatory Visit | Attending: Cardiology | Admitting: Cardiology

## 2013-01-27 ENCOUNTER — Other Ambulatory Visit: Payer: Self-pay | Admitting: Cardiology

## 2013-01-27 ENCOUNTER — Encounter (HOSPITAL_COMMUNITY)
Admission: RE | Admit: 2013-01-27 | Discharge: 2013-01-27 | Disposition: A | Discharge status: Home / Self Care | Payer: Self-pay | Source: Ambulatory Visit | Attending: Cardiology | Admitting: Cardiology

## 2013-01-30 ENCOUNTER — Encounter (HOSPITAL_COMMUNITY)
Admission: RE | Admit: 2013-01-30 | Discharge: 2013-01-30 | Disposition: A | Discharge status: Home / Self Care | Payer: Self-pay | Source: Ambulatory Visit | Attending: Cardiology | Admitting: Cardiology

## 2013-01-30 DIAGNOSIS — Z9861 Coronary angioplasty status: Secondary | ICD-10-CM | POA: Insufficient documentation

## 2013-01-30 DIAGNOSIS — I252 Old myocardial infarction: Secondary | ICD-10-CM | POA: Insufficient documentation

## 2013-01-30 DIAGNOSIS — E785 Hyperlipidemia, unspecified: Secondary | ICD-10-CM | POA: Insufficient documentation

## 2013-01-30 DIAGNOSIS — Z5189 Encounter for other specified aftercare: Secondary | ICD-10-CM | POA: Insufficient documentation

## 2013-01-30 DIAGNOSIS — Z7982 Long term (current) use of aspirin: Secondary | ICD-10-CM | POA: Insufficient documentation

## 2013-01-30 DIAGNOSIS — I2582 Chronic total occlusion of coronary artery: Secondary | ICD-10-CM | POA: Insufficient documentation

## 2013-01-30 DIAGNOSIS — K219 Gastro-esophageal reflux disease without esophagitis: Secondary | ICD-10-CM | POA: Insufficient documentation

## 2013-01-30 DIAGNOSIS — I2510 Atherosclerotic heart disease of native coronary artery without angina pectoris: Secondary | ICD-10-CM | POA: Insufficient documentation

## 2013-01-30 DIAGNOSIS — E039 Hypothyroidism, unspecified: Secondary | ICD-10-CM | POA: Insufficient documentation

## 2013-02-01 ENCOUNTER — Encounter (HOSPITAL_COMMUNITY): Payer: Self-pay

## 2013-02-03 ENCOUNTER — Encounter (HOSPITAL_COMMUNITY): Payer: Self-pay

## 2013-02-06 ENCOUNTER — Encounter (HOSPITAL_COMMUNITY)
Admission: RE | Admit: 2013-02-06 | Discharge: 2013-02-06 | Disposition: A | Discharge status: Home / Self Care | Payer: Self-pay | Source: Ambulatory Visit | Attending: Cardiology | Admitting: Cardiology

## 2013-02-08 ENCOUNTER — Encounter (HOSPITAL_COMMUNITY)
Admission: RE | Admit: 2013-02-08 | Discharge: 2013-02-08 | Disposition: A | Discharge status: Home / Self Care | Payer: Self-pay | Source: Ambulatory Visit | Attending: Cardiology | Admitting: Cardiology

## 2013-02-10 ENCOUNTER — Encounter (HOSPITAL_COMMUNITY)
Admission: RE | Admit: 2013-02-10 | Discharge: 2013-02-10 | Disposition: A | Discharge status: Home / Self Care | Payer: Self-pay | Source: Ambulatory Visit | Attending: Cardiology | Admitting: Cardiology

## 2013-02-13 ENCOUNTER — Encounter (HOSPITAL_COMMUNITY)
Admission: RE | Admit: 2013-02-13 | Discharge: 2013-02-13 | Disposition: A | Discharge status: Home / Self Care | Payer: Self-pay | Source: Ambulatory Visit | Attending: Cardiology | Admitting: Cardiology

## 2013-02-15 ENCOUNTER — Encounter (HOSPITAL_COMMUNITY)
Admission: RE | Admit: 2013-02-15 | Discharge: 2013-02-15 | Disposition: A | Discharge status: Home / Self Care | Payer: Self-pay | Source: Ambulatory Visit | Attending: Cardiology | Admitting: Cardiology

## 2013-02-17 ENCOUNTER — Encounter (HOSPITAL_COMMUNITY)
Admission: RE | Admit: 2013-02-17 | Discharge: 2013-02-17 | Disposition: A | Discharge status: Home / Self Care | Payer: Self-pay | Source: Ambulatory Visit | Attending: Cardiology | Admitting: Cardiology

## 2013-02-20 ENCOUNTER — Encounter (HOSPITAL_COMMUNITY)
Admission: RE | Admit: 2013-02-20 | Discharge: 2013-02-20 | Disposition: A | Discharge status: Home / Self Care | Payer: Self-pay | Source: Ambulatory Visit | Attending: Cardiology | Admitting: Cardiology

## 2013-02-22 ENCOUNTER — Encounter (HOSPITAL_COMMUNITY): Payer: Self-pay

## 2013-02-24 ENCOUNTER — Encounter (HOSPITAL_COMMUNITY): Payer: Self-pay

## 2013-02-27 ENCOUNTER — Encounter (HOSPITAL_COMMUNITY)
Admission: RE | Admit: 2013-02-27 | Discharge: 2013-02-27 | Disposition: A | Discharge status: Home / Self Care | Payer: Self-pay | Source: Ambulatory Visit | Attending: Cardiology | Admitting: Cardiology

## 2013-03-01 ENCOUNTER — Encounter (HOSPITAL_COMMUNITY)
Admission: RE | Admit: 2013-03-01 | Discharge: 2013-03-01 | Disposition: A | Discharge status: Home / Self Care | Payer: Self-pay | Source: Ambulatory Visit | Attending: Cardiology | Admitting: Cardiology

## 2013-03-01 DIAGNOSIS — E785 Hyperlipidemia, unspecified: Secondary | ICD-10-CM | POA: Insufficient documentation

## 2013-03-01 DIAGNOSIS — I252 Old myocardial infarction: Secondary | ICD-10-CM | POA: Insufficient documentation

## 2013-03-01 DIAGNOSIS — K219 Gastro-esophageal reflux disease without esophagitis: Secondary | ICD-10-CM | POA: Insufficient documentation

## 2013-03-01 DIAGNOSIS — Z7982 Long term (current) use of aspirin: Secondary | ICD-10-CM | POA: Insufficient documentation

## 2013-03-01 DIAGNOSIS — Z5189 Encounter for other specified aftercare: Secondary | ICD-10-CM | POA: Insufficient documentation

## 2013-03-01 DIAGNOSIS — Z9861 Coronary angioplasty status: Secondary | ICD-10-CM | POA: Insufficient documentation

## 2013-03-01 DIAGNOSIS — E039 Hypothyroidism, unspecified: Secondary | ICD-10-CM | POA: Insufficient documentation

## 2013-03-01 DIAGNOSIS — I2510 Atherosclerotic heart disease of native coronary artery without angina pectoris: Secondary | ICD-10-CM | POA: Insufficient documentation

## 2013-03-01 DIAGNOSIS — I2582 Chronic total occlusion of coronary artery: Secondary | ICD-10-CM | POA: Insufficient documentation

## 2013-03-03 ENCOUNTER — Encounter (HOSPITAL_COMMUNITY)
Admission: RE | Admit: 2013-03-03 | Discharge: 2013-03-03 | Disposition: A | Discharge status: Home / Self Care | Payer: Self-pay | Source: Ambulatory Visit | Attending: Cardiology | Admitting: Cardiology

## 2013-03-06 ENCOUNTER — Encounter (HOSPITAL_COMMUNITY)
Admission: RE | Admit: 2013-03-06 | Discharge: 2013-03-06 | Disposition: A | Discharge status: Home / Self Care | Payer: Self-pay | Source: Ambulatory Visit | Attending: Cardiology | Admitting: Cardiology

## 2013-03-08 ENCOUNTER — Encounter (HOSPITAL_COMMUNITY): Admission: RE | Admit: 2013-03-08 | Payer: Self-pay | Source: Ambulatory Visit

## 2013-03-10 ENCOUNTER — Encounter (HOSPITAL_COMMUNITY)
Admission: RE | Admit: 2013-03-10 | Discharge: 2013-03-10 | Disposition: A | Discharge status: Home / Self Care | Payer: Self-pay | Source: Ambulatory Visit | Attending: Cardiology | Admitting: Cardiology

## 2013-03-10 ENCOUNTER — Other Ambulatory Visit: Payer: Self-pay

## 2013-03-10 MED ORDER — ROSUVASTATIN CALCIUM 10 MG PO TABS: 10.0000 mg | ORAL_TABLET | Freq: Every day | ORAL | Status: DC

## 2013-03-13 ENCOUNTER — Encounter (HOSPITAL_COMMUNITY): Payer: Self-pay

## 2013-03-15 ENCOUNTER — Encounter (HOSPITAL_COMMUNITY)
Admission: RE | Admit: 2013-03-15 | Discharge: 2013-03-15 | Disposition: A | Discharge status: Home / Self Care | Payer: Self-pay | Source: Ambulatory Visit | Attending: Cardiology | Admitting: Cardiology

## 2013-03-17 ENCOUNTER — Encounter (HOSPITAL_COMMUNITY)
Admission: RE | Admit: 2013-03-17 | Discharge: 2013-03-17 | Disposition: A | Discharge status: Home / Self Care | Payer: Self-pay | Source: Ambulatory Visit | Attending: Cardiology | Admitting: Cardiology

## 2013-03-20 ENCOUNTER — Encounter (HOSPITAL_COMMUNITY)
Admission: RE | Admit: 2013-03-20 | Discharge: 2013-03-20 | Disposition: A | Discharge status: Home / Self Care | Payer: Self-pay | Source: Ambulatory Visit | Attending: Cardiology | Admitting: Cardiology

## 2013-03-22 ENCOUNTER — Encounter (HOSPITAL_COMMUNITY)
Admission: RE | Admit: 2013-03-22 | Discharge: 2013-03-22 | Disposition: A | Discharge status: Home / Self Care | Payer: Self-pay | Source: Ambulatory Visit | Attending: Cardiology | Admitting: Cardiology

## 2013-03-24 ENCOUNTER — Encounter (HOSPITAL_COMMUNITY)
Admission: RE | Admit: 2013-03-24 | Discharge: 2013-03-24 | Disposition: A | Discharge status: Home / Self Care | Payer: Self-pay | Source: Ambulatory Visit | Attending: Cardiology | Admitting: Cardiology

## 2013-03-27 ENCOUNTER — Encounter (HOSPITAL_COMMUNITY)
Admission: RE | Admit: 2013-03-27 | Discharge: 2013-03-27 | Disposition: A | Discharge status: Home / Self Care | Payer: Self-pay | Source: Ambulatory Visit | Attending: Cardiology | Admitting: Cardiology

## 2013-03-29 ENCOUNTER — Encounter (HOSPITAL_COMMUNITY): Admission: RE | Admit: 2013-03-29 | Payer: Self-pay | Source: Ambulatory Visit

## 2013-03-31 ENCOUNTER — Encounter (HOSPITAL_COMMUNITY): Payer: Self-pay

## 2013-03-31 DIAGNOSIS — I2582 Chronic total occlusion of coronary artery: Secondary | ICD-10-CM | POA: Insufficient documentation

## 2013-03-31 DIAGNOSIS — K219 Gastro-esophageal reflux disease without esophagitis: Secondary | ICD-10-CM | POA: Insufficient documentation

## 2013-03-31 DIAGNOSIS — E785 Hyperlipidemia, unspecified: Secondary | ICD-10-CM | POA: Insufficient documentation

## 2013-03-31 DIAGNOSIS — E039 Hypothyroidism, unspecified: Secondary | ICD-10-CM | POA: Insufficient documentation

## 2013-03-31 DIAGNOSIS — Z5189 Encounter for other specified aftercare: Secondary | ICD-10-CM | POA: Insufficient documentation

## 2013-03-31 DIAGNOSIS — I252 Old myocardial infarction: Secondary | ICD-10-CM | POA: Insufficient documentation

## 2013-03-31 DIAGNOSIS — Z7982 Long term (current) use of aspirin: Secondary | ICD-10-CM | POA: Insufficient documentation

## 2013-03-31 DIAGNOSIS — Z9861 Coronary angioplasty status: Secondary | ICD-10-CM | POA: Insufficient documentation

## 2013-03-31 DIAGNOSIS — I2510 Atherosclerotic heart disease of native coronary artery without angina pectoris: Secondary | ICD-10-CM | POA: Insufficient documentation

## 2013-04-03 ENCOUNTER — Encounter (HOSPITAL_COMMUNITY)
Admission: RE | Admit: 2013-04-03 | Discharge: 2013-04-03 | Disposition: A | Discharge status: Home / Self Care | Payer: Self-pay | Source: Ambulatory Visit | Attending: Cardiology | Admitting: Cardiology

## 2013-04-03 DIAGNOSIS — E039 Hypothyroidism, unspecified: Secondary | ICD-10-CM | POA: Diagnosis not present

## 2013-04-03 DIAGNOSIS — E785 Hyperlipidemia, unspecified: Secondary | ICD-10-CM | POA: Diagnosis not present

## 2013-04-03 DIAGNOSIS — D539 Nutritional anemia, unspecified: Secondary | ICD-10-CM | POA: Diagnosis not present

## 2013-04-03 DIAGNOSIS — I2510 Atherosclerotic heart disease of native coronary artery without angina pectoris: Secondary | ICD-10-CM | POA: Diagnosis not present

## 2013-04-05 ENCOUNTER — Encounter (HOSPITAL_COMMUNITY): Payer: Self-pay

## 2013-04-07 ENCOUNTER — Encounter (HOSPITAL_COMMUNITY): Payer: Self-pay

## 2013-04-10 ENCOUNTER — Encounter (HOSPITAL_COMMUNITY)
Admission: RE | Admit: 2013-04-10 | Discharge: 2013-04-10 | Disposition: A | Discharge status: Home / Self Care | Payer: Self-pay | Source: Ambulatory Visit | Attending: Cardiology | Admitting: Cardiology

## 2013-04-10 DIAGNOSIS — E538 Deficiency of other specified B group vitamins: Secondary | ICD-10-CM | POA: Diagnosis not present

## 2013-04-10 DIAGNOSIS — Z0000 Encounter for general adult medical examination without abnormal findings: Secondary | ICD-10-CM | POA: Diagnosis not present

## 2013-04-10 DIAGNOSIS — I252 Old myocardial infarction: Secondary | ICD-10-CM | POA: Diagnosis not present

## 2013-04-10 DIAGNOSIS — E539 Vitamin B deficiency, unspecified: Secondary | ICD-10-CM | POA: Insufficient documentation

## 2013-04-10 DIAGNOSIS — R030 Elevated blood-pressure reading, without diagnosis of hypertension: Secondary | ICD-10-CM | POA: Diagnosis not present

## 2013-04-10 DIAGNOSIS — I2510 Atherosclerotic heart disease of native coronary artery without angina pectoris: Secondary | ICD-10-CM | POA: Diagnosis not present

## 2013-04-10 DIAGNOSIS — D539 Nutritional anemia, unspecified: Secondary | ICD-10-CM | POA: Diagnosis not present

## 2013-04-10 DIAGNOSIS — Z1331 Encounter for screening for depression: Secondary | ICD-10-CM | POA: Diagnosis not present

## 2013-04-10 DIAGNOSIS — Z125 Encounter for screening for malignant neoplasm of prostate: Secondary | ICD-10-CM | POA: Diagnosis not present

## 2013-04-10 DIAGNOSIS — M19049 Primary osteoarthritis, unspecified hand: Secondary | ICD-10-CM | POA: Diagnosis not present

## 2013-04-10 DIAGNOSIS — E785 Hyperlipidemia, unspecified: Secondary | ICD-10-CM | POA: Diagnosis not present

## 2013-04-12 ENCOUNTER — Encounter (HOSPITAL_COMMUNITY)
Admission: RE | Admit: 2013-04-12 | Discharge: 2013-04-12 | Disposition: A | Discharge status: Home / Self Care | Payer: Self-pay | Source: Ambulatory Visit | Attending: Cardiology | Admitting: Cardiology

## 2013-04-14 ENCOUNTER — Encounter (HOSPITAL_COMMUNITY)
Admission: RE | Admit: 2013-04-14 | Discharge: 2013-04-14 | Disposition: A | Discharge status: Home / Self Care | Payer: Self-pay | Source: Ambulatory Visit | Attending: Cardiology | Admitting: Cardiology

## 2013-04-17 ENCOUNTER — Encounter (HOSPITAL_COMMUNITY)
Admission: RE | Admit: 2013-04-17 | Discharge: 2013-04-17 | Disposition: A | Discharge status: Home / Self Care | Payer: Self-pay | Source: Ambulatory Visit | Attending: Cardiology | Admitting: Cardiology

## 2013-04-19 ENCOUNTER — Encounter (HOSPITAL_COMMUNITY)
Admission: RE | Admit: 2013-04-19 | Discharge: 2013-04-19 | Disposition: A | Discharge status: Home / Self Care | Payer: Self-pay | Source: Ambulatory Visit | Attending: Cardiology | Admitting: Cardiology

## 2013-04-21 ENCOUNTER — Encounter (HOSPITAL_COMMUNITY)
Admission: RE | Admit: 2013-04-21 | Discharge: 2013-04-21 | Disposition: A | Discharge status: Home / Self Care | Payer: Self-pay | Source: Ambulatory Visit | Attending: Cardiology | Admitting: Cardiology

## 2013-04-26 ENCOUNTER — Encounter (HOSPITAL_COMMUNITY)
Admission: RE | Admit: 2013-04-26 | Discharge: 2013-04-26 | Disposition: A | Discharge status: Home / Self Care | Payer: Self-pay | Source: Ambulatory Visit | Attending: Cardiology | Admitting: Cardiology

## 2013-04-28 ENCOUNTER — Encounter (HOSPITAL_COMMUNITY)
Admission: RE | Admit: 2013-04-28 | Discharge: 2013-04-28 | Disposition: A | Discharge status: Home / Self Care | Payer: Self-pay | Source: Ambulatory Visit | Attending: Cardiology | Admitting: Cardiology

## 2013-05-01 ENCOUNTER — Encounter (HOSPITAL_COMMUNITY)
Admission: RE | Admit: 2013-05-01 | Discharge: 2013-05-01 | Disposition: A | Discharge status: Home / Self Care | Payer: Self-pay | Source: Ambulatory Visit | Attending: Cardiology | Admitting: Cardiology

## 2013-05-01 DIAGNOSIS — Z9861 Coronary angioplasty status: Secondary | ICD-10-CM | POA: Insufficient documentation

## 2013-05-01 DIAGNOSIS — I2510 Atherosclerotic heart disease of native coronary artery without angina pectoris: Secondary | ICD-10-CM | POA: Insufficient documentation

## 2013-05-01 DIAGNOSIS — E039 Hypothyroidism, unspecified: Secondary | ICD-10-CM | POA: Insufficient documentation

## 2013-05-01 DIAGNOSIS — I2582 Chronic total occlusion of coronary artery: Secondary | ICD-10-CM | POA: Insufficient documentation

## 2013-05-01 DIAGNOSIS — I252 Old myocardial infarction: Secondary | ICD-10-CM | POA: Insufficient documentation

## 2013-05-01 DIAGNOSIS — Z5189 Encounter for other specified aftercare: Secondary | ICD-10-CM | POA: Insufficient documentation

## 2013-05-01 DIAGNOSIS — K219 Gastro-esophageal reflux disease without esophagitis: Secondary | ICD-10-CM | POA: Insufficient documentation

## 2013-05-01 DIAGNOSIS — E785 Hyperlipidemia, unspecified: Secondary | ICD-10-CM | POA: Insufficient documentation

## 2013-05-01 DIAGNOSIS — Z7982 Long term (current) use of aspirin: Secondary | ICD-10-CM | POA: Insufficient documentation

## 2013-05-03 ENCOUNTER — Encounter (HOSPITAL_COMMUNITY)
Admission: RE | Admit: 2013-05-03 | Discharge: 2013-05-03 | Disposition: A | Discharge status: Home / Self Care | Payer: Self-pay | Source: Ambulatory Visit | Attending: Cardiology | Admitting: Cardiology

## 2013-05-05 ENCOUNTER — Encounter (HOSPITAL_COMMUNITY)
Admission: RE | Admit: 2013-05-05 | Discharge: 2013-05-05 | Disposition: A | Discharge status: Home / Self Care | Payer: Self-pay | Source: Ambulatory Visit | Attending: Cardiology | Admitting: Cardiology

## 2013-05-08 ENCOUNTER — Encounter (HOSPITAL_COMMUNITY)
Admission: RE | Admit: 2013-05-08 | Discharge: 2013-05-08 | Disposition: A | Discharge status: Home / Self Care | Payer: Self-pay | Source: Ambulatory Visit | Attending: Cardiology | Admitting: Cardiology

## 2013-05-08 DIAGNOSIS — L570 Actinic keratosis: Secondary | ICD-10-CM | POA: Diagnosis not present

## 2013-05-08 DIAGNOSIS — L821 Other seborrheic keratosis: Secondary | ICD-10-CM | POA: Diagnosis not present

## 2013-05-08 DIAGNOSIS — D239 Other benign neoplasm of skin, unspecified: Secondary | ICD-10-CM | POA: Diagnosis not present

## 2013-05-10 ENCOUNTER — Encounter (HOSPITAL_COMMUNITY)
Admission: RE | Admit: 2013-05-10 | Discharge: 2013-05-10 | Disposition: A | Discharge status: Home / Self Care | Payer: Self-pay | Source: Ambulatory Visit | Attending: Cardiology | Admitting: Cardiology

## 2013-05-12 ENCOUNTER — Ambulatory Visit (INDEPENDENT_AMBULATORY_CARE_PROVIDER_SITE_OTHER): Payer: Medicare Other | Admitting: Cardiology

## 2013-05-12 ENCOUNTER — Encounter (HOSPITAL_COMMUNITY): Payer: Self-pay

## 2013-05-12 ENCOUNTER — Encounter: Payer: Self-pay | Admitting: Cardiology

## 2013-05-12 VITALS — BP 138/76 | HR 52 | Ht 70.0 in | Wt 174.8 lb

## 2013-05-12 DIAGNOSIS — I2510 Atherosclerotic heart disease of native coronary artery without angina pectoris: Secondary | ICD-10-CM

## 2013-05-12 DIAGNOSIS — E785 Hyperlipidemia, unspecified: Secondary | ICD-10-CM | POA: Diagnosis not present

## 2013-05-12 NOTE — Progress Notes (Signed)
Joseph Roman Date of Birth: December 26, 1929 Medical Record #[id number]  History of Present Illness: Joseph Roman is seen for yearly followup. He has a history of coronary disease and is status post inferior lateral myocardial infarction in June of 2009. He had stenting of the mid left circumflex coronary emergently with a 2.75 x 23 mm Promus stent. His last stress test in July of 2010 was normal. On followup today he is doing very well. He's had no health concerns over the past year. He denies any symptoms of chest pain or shortness of breath. He remains physically active.  Current Outpatient Prescriptions on File Prior to Visit  Medication Sig Dispense Refill  . aspirin 81 MG tablet Take 81 mg by mouth daily.      . clopidogrel (PLAVIX) 75 MG tablet TAKE 1 TABLET (75 MG TOTAL) BY MOUTH DAILY.  30 tablet  4  . levothyroxine (SYNTHROID, LEVOTHROID) 112 MCG tablet Take 112 mcg by mouth daily.      . rosuvastatin (CRESTOR) 10 MG tablet Take 1 tablet (10 mg total) by mouth daily.  90 tablet  0  . nitroGLYCERIN (NITROSTAT) 0.4 MG SL tablet Place 1 tablet (0.4 mg total) under the tongue every 5 (five) minutes as needed for chest pain.  25 tablet  11   No current facility-administered medications on file prior to visit.    Allergies  Allergen Reactions  . Other     Beta blockers-bradycardia    Past Medical History  Diagnosis Date  . Acute inferolateral myocardial infarction     04/2008 2.75x23 Promus Lcx  . Coronary artery disease   . Hypothyroidism   . Dyslipidemia     Mild dyslipidemia  . GERD (gastroesophageal reflux disease)     Past Surgical History  Procedure Laterality Date  . Cardiac catheterization  05/07/2008    Ejection fraction was estimated at 60%  . Colonoscopy  04/21/2005    with polypectomy  . Tonsillectomy    . Cataract      History  Smoking status  . Never Smoker   Smokeless tobacco  . Not on file    History  Alcohol Use     History reviewed. No  pertinent family history.  Review of Systems: As noted in history of present illness.  All other systems were reviewed and are negative.  Physical Exam: BP 138/76  Pulse 52  Ht 5\' 10"  (1.778 m)  Wt 174 lb 12.8 oz (79.289 kg)  BMI 25.08 kg/m2 Is a pleasant, elderly white male in no acute distress.The patient is alert and oriented x 3. The skin is warm and dry.    The HEENT exam is normal.  The carotids are 2+ without bruits.  There is no thyromegaly.  There is no JVD.  The lungs are clear.    The heart exam reveals a regular rate with a normal S1 and S2.  There are no murmurs, gallops, or rubs.  The PMI is not displaced.   Abdominal exam reveals good bowel sounds.   There is no hepatosplenomegaly or tenderness.  There are no masses.  Exam of the legs reveal no clubbing, cyanosis, or edema.  The legs are without rashes.  The distal pulses are intact.  Cranial nerves II - XII are intact.  Motor and sensory functions are intact.  The gait is normal.  LABORATORY DATA: ECG today shows sinus bradycardia with a rate of 52 beats per minute. It is otherwise normal. Laboratory data from 04/03/2013 shows  a normal complete chemistry panel and CBC. Total cholesterol is 97, triglycerides 53, HDL 35, LDL 51.  Assessment / Plan: 1. Coronary disease with remote myocardial infarction status post stenting of the left circumflex coronary in 2009. He is asymptomatic. Continue antiplatelet therapy and statin.  2. Dyslipidemia-well controlled on statin therapy.

## 2013-05-12 NOTE — Patient Instructions (Signed)
Continue your current therapy  I will see you in one year   

## 2013-05-15 ENCOUNTER — Encounter (HOSPITAL_COMMUNITY)
Admission: RE | Admit: 2013-05-15 | Discharge: 2013-05-15 | Disposition: A | Discharge status: Home / Self Care | Payer: Self-pay | Source: Ambulatory Visit | Attending: Cardiology | Admitting: Cardiology

## 2013-05-15 DIAGNOSIS — H268 Other specified cataract: Secondary | ICD-10-CM | POA: Diagnosis not present

## 2013-05-15 DIAGNOSIS — H259 Unspecified age-related cataract: Secondary | ICD-10-CM | POA: Diagnosis not present

## 2013-05-15 DIAGNOSIS — H04129 Dry eye syndrome of unspecified lacrimal gland: Secondary | ICD-10-CM | POA: Diagnosis not present

## 2013-05-15 DIAGNOSIS — Z961 Presence of intraocular lens: Secondary | ICD-10-CM | POA: Diagnosis not present

## 2013-05-17 ENCOUNTER — Encounter (HOSPITAL_COMMUNITY)
Admission: RE | Admit: 2013-05-17 | Discharge: 2013-05-17 | Disposition: A | Discharge status: Home / Self Care | Payer: Self-pay | Source: Ambulatory Visit | Attending: Cardiology | Admitting: Cardiology

## 2013-05-19 ENCOUNTER — Encounter (HOSPITAL_COMMUNITY): Payer: Self-pay

## 2013-05-22 ENCOUNTER — Encounter (HOSPITAL_COMMUNITY): Payer: Self-pay

## 2013-05-24 ENCOUNTER — Encounter (HOSPITAL_COMMUNITY)
Admission: RE | Admit: 2013-05-24 | Discharge: 2013-05-24 | Disposition: A | Discharge status: Home / Self Care | Payer: Self-pay | Source: Ambulatory Visit | Attending: Cardiology | Admitting: Cardiology

## 2013-05-26 ENCOUNTER — Encounter (HOSPITAL_COMMUNITY)
Admission: RE | Admit: 2013-05-26 | Discharge: 2013-05-26 | Disposition: A | Discharge status: Home / Self Care | Payer: Self-pay | Source: Ambulatory Visit | Attending: Cardiology | Admitting: Cardiology

## 2013-05-29 ENCOUNTER — Encounter (HOSPITAL_COMMUNITY)
Admission: RE | Admit: 2013-05-29 | Discharge: 2013-05-29 | Disposition: A | Discharge status: Home / Self Care | Payer: Self-pay | Source: Ambulatory Visit | Attending: Cardiology | Admitting: Cardiology

## 2013-05-31 ENCOUNTER — Encounter (HOSPITAL_COMMUNITY)
Admission: RE | Admit: 2013-05-31 | Discharge: 2013-05-31 | Disposition: A | Discharge status: Home / Self Care | Payer: Self-pay | Source: Ambulatory Visit | Attending: Cardiology | Admitting: Cardiology

## 2013-05-31 DIAGNOSIS — I252 Old myocardial infarction: Secondary | ICD-10-CM | POA: Insufficient documentation

## 2013-05-31 DIAGNOSIS — E785 Hyperlipidemia, unspecified: Secondary | ICD-10-CM | POA: Insufficient documentation

## 2013-05-31 DIAGNOSIS — E039 Hypothyroidism, unspecified: Secondary | ICD-10-CM | POA: Insufficient documentation

## 2013-05-31 DIAGNOSIS — Z9861 Coronary angioplasty status: Secondary | ICD-10-CM | POA: Insufficient documentation

## 2013-05-31 DIAGNOSIS — I2510 Atherosclerotic heart disease of native coronary artery without angina pectoris: Secondary | ICD-10-CM | POA: Insufficient documentation

## 2013-05-31 DIAGNOSIS — K219 Gastro-esophageal reflux disease without esophagitis: Secondary | ICD-10-CM | POA: Insufficient documentation

## 2013-05-31 DIAGNOSIS — Z5189 Encounter for other specified aftercare: Secondary | ICD-10-CM | POA: Insufficient documentation

## 2013-05-31 DIAGNOSIS — I2582 Chronic total occlusion of coronary artery: Secondary | ICD-10-CM | POA: Insufficient documentation

## 2013-05-31 DIAGNOSIS — Z7982 Long term (current) use of aspirin: Secondary | ICD-10-CM | POA: Insufficient documentation

## 2013-06-05 ENCOUNTER — Encounter (HOSPITAL_COMMUNITY)
Admission: RE | Admit: 2013-06-05 | Discharge: 2013-06-05 | Disposition: A | Discharge status: Home / Self Care | Payer: Self-pay | Source: Ambulatory Visit | Attending: Cardiology | Admitting: Cardiology

## 2013-06-07 ENCOUNTER — Encounter (HOSPITAL_COMMUNITY)
Admission: RE | Admit: 2013-06-07 | Discharge: 2013-06-07 | Disposition: A | Discharge status: Home / Self Care | Payer: Self-pay | Source: Ambulatory Visit | Attending: Cardiology | Admitting: Cardiology

## 2013-06-09 ENCOUNTER — Encounter (HOSPITAL_COMMUNITY)
Admission: RE | Admit: 2013-06-09 | Discharge: 2013-06-09 | Disposition: A | Discharge status: Home / Self Care | Payer: Self-pay | Source: Ambulatory Visit | Attending: Cardiology | Admitting: Cardiology

## 2013-06-12 ENCOUNTER — Encounter (HOSPITAL_COMMUNITY)
Admission: RE | Admit: 2013-06-12 | Discharge: 2013-06-12 | Disposition: A | Discharge status: Home / Self Care | Payer: Self-pay | Source: Ambulatory Visit | Attending: Cardiology | Admitting: Cardiology

## 2013-06-14 ENCOUNTER — Encounter (HOSPITAL_COMMUNITY)
Admission: RE | Admit: 2013-06-14 | Discharge: 2013-06-14 | Disposition: A | Discharge status: Home / Self Care | Payer: Self-pay | Source: Ambulatory Visit | Attending: Cardiology | Admitting: Cardiology

## 2013-06-14 ENCOUNTER — Other Ambulatory Visit: Payer: Self-pay | Admitting: Cardiology

## 2013-06-16 ENCOUNTER — Encounter (HOSPITAL_COMMUNITY)
Admission: RE | Admit: 2013-06-16 | Discharge: 2013-06-16 | Disposition: A | Discharge status: Home / Self Care | Payer: Self-pay | Source: Ambulatory Visit | Attending: Cardiology | Admitting: Cardiology

## 2013-06-19 ENCOUNTER — Encounter (HOSPITAL_COMMUNITY)
Admission: RE | Admit: 2013-06-19 | Discharge: 2013-06-19 | Disposition: A | Discharge status: Home / Self Care | Payer: Self-pay | Source: Ambulatory Visit | Attending: Cardiology | Admitting: Cardiology

## 2013-06-21 ENCOUNTER — Encounter (HOSPITAL_COMMUNITY)
Admission: RE | Admit: 2013-06-21 | Discharge: 2013-06-21 | Disposition: A | Discharge status: Home / Self Care | Payer: Self-pay | Source: Ambulatory Visit | Attending: Cardiology | Admitting: Cardiology

## 2013-06-23 ENCOUNTER — Encounter (HOSPITAL_COMMUNITY): Payer: Self-pay

## 2013-06-26 ENCOUNTER — Encounter (HOSPITAL_COMMUNITY): Payer: Self-pay

## 2013-06-28 ENCOUNTER — Encounter (HOSPITAL_COMMUNITY): Payer: Self-pay

## 2013-06-30 ENCOUNTER — Encounter (HOSPITAL_COMMUNITY)
Admission: RE | Admit: 2013-06-30 | Discharge: 2013-06-30 | Disposition: A | Discharge status: Home / Self Care | Payer: Self-pay | Source: Ambulatory Visit | Attending: Cardiology | Admitting: Cardiology

## 2013-06-30 DIAGNOSIS — I2510 Atherosclerotic heart disease of native coronary artery without angina pectoris: Secondary | ICD-10-CM | POA: Insufficient documentation

## 2013-06-30 DIAGNOSIS — Z5189 Encounter for other specified aftercare: Secondary | ICD-10-CM | POA: Insufficient documentation

## 2013-06-30 DIAGNOSIS — I252 Old myocardial infarction: Secondary | ICD-10-CM | POA: Insufficient documentation

## 2013-06-30 DIAGNOSIS — E785 Hyperlipidemia, unspecified: Secondary | ICD-10-CM | POA: Insufficient documentation

## 2013-06-30 DIAGNOSIS — Z9861 Coronary angioplasty status: Secondary | ICD-10-CM | POA: Insufficient documentation

## 2013-06-30 DIAGNOSIS — E039 Hypothyroidism, unspecified: Secondary | ICD-10-CM | POA: Insufficient documentation

## 2013-06-30 DIAGNOSIS — Z7982 Long term (current) use of aspirin: Secondary | ICD-10-CM | POA: Insufficient documentation

## 2013-06-30 DIAGNOSIS — K219 Gastro-esophageal reflux disease without esophagitis: Secondary | ICD-10-CM | POA: Insufficient documentation

## 2013-06-30 DIAGNOSIS — I2582 Chronic total occlusion of coronary artery: Secondary | ICD-10-CM | POA: Insufficient documentation

## 2013-07-03 ENCOUNTER — Encounter (HOSPITAL_COMMUNITY)
Admission: RE | Admit: 2013-07-03 | Discharge: 2013-07-03 | Disposition: A | Discharge status: Home / Self Care | Payer: Self-pay | Source: Ambulatory Visit | Attending: Cardiology | Admitting: Cardiology

## 2013-07-05 ENCOUNTER — Encounter (HOSPITAL_COMMUNITY)
Admission: RE | Admit: 2013-07-05 | Discharge: 2013-07-05 | Disposition: A | Discharge status: Home / Self Care | Payer: Self-pay | Source: Ambulatory Visit | Attending: Cardiology | Admitting: Cardiology

## 2013-07-07 ENCOUNTER — Encounter (HOSPITAL_COMMUNITY)
Admission: RE | Admit: 2013-07-07 | Discharge: 2013-07-07 | Disposition: A | Discharge status: Home / Self Care | Payer: Self-pay | Source: Ambulatory Visit | Attending: Cardiology | Admitting: Cardiology

## 2013-07-10 ENCOUNTER — Encounter (HOSPITAL_COMMUNITY)
Admission: RE | Admit: 2013-07-10 | Discharge: 2013-07-10 | Disposition: A | Discharge status: Home / Self Care | Payer: Self-pay | Source: Ambulatory Visit | Attending: Cardiology | Admitting: Cardiology

## 2013-07-12 ENCOUNTER — Encounter (HOSPITAL_COMMUNITY)
Admission: RE | Admit: 2013-07-12 | Discharge: 2013-07-12 | Disposition: A | Discharge status: Home / Self Care | Payer: Self-pay | Source: Ambulatory Visit | Attending: Cardiology | Admitting: Cardiology

## 2013-07-14 ENCOUNTER — Encounter (HOSPITAL_COMMUNITY)
Admission: RE | Admit: 2013-07-14 | Discharge: 2013-07-14 | Disposition: A | Discharge status: Home / Self Care | Payer: Self-pay | Source: Ambulatory Visit | Attending: Cardiology | Admitting: Cardiology

## 2013-07-17 ENCOUNTER — Encounter (HOSPITAL_COMMUNITY): Payer: Self-pay

## 2013-07-19 ENCOUNTER — Encounter (HOSPITAL_COMMUNITY)
Admission: RE | Admit: 2013-07-19 | Discharge: 2013-07-19 | Disposition: A | Discharge status: Home / Self Care | Payer: Self-pay | Source: Ambulatory Visit | Attending: Cardiology | Admitting: Cardiology

## 2013-07-21 ENCOUNTER — Other Ambulatory Visit: Payer: Self-pay | Admitting: Cardiology

## 2013-07-21 ENCOUNTER — Encounter (HOSPITAL_COMMUNITY)
Admission: RE | Admit: 2013-07-21 | Discharge: 2013-07-21 | Disposition: A | Discharge status: Home / Self Care | Payer: Self-pay | Source: Ambulatory Visit | Attending: Cardiology | Admitting: Cardiology

## 2013-07-21 NOTE — Telephone Encounter (Signed)
clopidogrel (PLAVIX) 75 MG tablet  TAKE 1 TABLET (75 MG TOTAL) BY MOUTH DAILY.   30 tablet   4  Patient Instructions  Continue your current therapy I will see you in one year. Peter M [place], MD at 05/12/2013  2:29 PM

## 2013-07-24 ENCOUNTER — Encounter (HOSPITAL_COMMUNITY): Payer: Self-pay

## 2013-07-26 ENCOUNTER — Encounter (HOSPITAL_COMMUNITY): Payer: Self-pay

## 2013-07-28 ENCOUNTER — Encounter (HOSPITAL_COMMUNITY): Payer: Self-pay

## 2013-08-02 ENCOUNTER — Encounter (HOSPITAL_COMMUNITY): Payer: Medicare Other

## 2013-08-02 DIAGNOSIS — K219 Gastro-esophageal reflux disease without esophagitis: Secondary | ICD-10-CM | POA: Insufficient documentation

## 2013-08-02 DIAGNOSIS — Z7982 Long term (current) use of aspirin: Secondary | ICD-10-CM | POA: Insufficient documentation

## 2013-08-02 DIAGNOSIS — E785 Hyperlipidemia, unspecified: Secondary | ICD-10-CM | POA: Insufficient documentation

## 2013-08-02 DIAGNOSIS — I252 Old myocardial infarction: Secondary | ICD-10-CM | POA: Insufficient documentation

## 2013-08-02 DIAGNOSIS — E039 Hypothyroidism, unspecified: Secondary | ICD-10-CM | POA: Insufficient documentation

## 2013-08-02 DIAGNOSIS — I2582 Chronic total occlusion of coronary artery: Secondary | ICD-10-CM | POA: Insufficient documentation

## 2013-08-02 DIAGNOSIS — I2510 Atherosclerotic heart disease of native coronary artery without angina pectoris: Secondary | ICD-10-CM | POA: Insufficient documentation

## 2013-08-02 DIAGNOSIS — Z5189 Encounter for other specified aftercare: Secondary | ICD-10-CM | POA: Insufficient documentation

## 2013-08-02 DIAGNOSIS — Z9861 Coronary angioplasty status: Secondary | ICD-10-CM | POA: Insufficient documentation

## 2013-08-04 ENCOUNTER — Encounter (HOSPITAL_COMMUNITY)
Admission: RE | Admit: 2013-08-04 | Discharge: 2013-08-04 | Disposition: A | Discharge status: Home / Self Care | Payer: Self-pay | Source: Ambulatory Visit | Attending: Cardiology | Admitting: Cardiology

## 2013-08-07 ENCOUNTER — Encounter (HOSPITAL_COMMUNITY)
Admission: RE | Admit: 2013-08-07 | Discharge: 2013-08-07 | Disposition: A | Discharge status: Home / Self Care | Payer: Self-pay | Source: Ambulatory Visit | Attending: Cardiology | Admitting: Cardiology

## 2013-08-09 ENCOUNTER — Encounter (HOSPITAL_COMMUNITY)
Admission: RE | Admit: 2013-08-09 | Discharge: 2013-08-09 | Disposition: A | Discharge status: Home / Self Care | Payer: Self-pay | Source: Ambulatory Visit | Attending: Cardiology | Admitting: Cardiology

## 2013-08-11 ENCOUNTER — Encounter (HOSPITAL_COMMUNITY): Payer: Medicare Other

## 2013-08-14 ENCOUNTER — Encounter (HOSPITAL_COMMUNITY)
Admission: RE | Admit: 2013-08-14 | Discharge: 2013-08-14 | Disposition: A | Discharge status: Home / Self Care | Payer: Self-pay | Source: Ambulatory Visit | Attending: Cardiology | Admitting: Cardiology

## 2013-08-16 ENCOUNTER — Encounter (HOSPITAL_COMMUNITY)
Admission: RE | Admit: 2013-08-16 | Discharge: 2013-08-16 | Disposition: A | Discharge status: Home / Self Care | Payer: Self-pay | Source: Ambulatory Visit | Attending: Cardiology | Admitting: Cardiology

## 2013-08-18 ENCOUNTER — Encounter (HOSPITAL_COMMUNITY)
Admission: RE | Admit: 2013-08-18 | Discharge: 2013-08-18 | Disposition: A | Discharge status: Home / Self Care | Payer: Self-pay | Source: Ambulatory Visit | Attending: Cardiology | Admitting: Cardiology

## 2013-08-21 ENCOUNTER — Encounter (HOSPITAL_COMMUNITY)
Admission: RE | Admit: 2013-08-21 | Discharge: 2013-08-21 | Disposition: A | Discharge status: Home / Self Care | Payer: Self-pay | Source: Ambulatory Visit | Attending: Cardiology | Admitting: Cardiology

## 2013-08-23 ENCOUNTER — Encounter (HOSPITAL_COMMUNITY): Payer: Medicare Other

## 2013-08-25 ENCOUNTER — Encounter (HOSPITAL_COMMUNITY): Payer: Medicare Other

## 2013-08-28 ENCOUNTER — Encounter (HOSPITAL_COMMUNITY)
Admission: RE | Admit: 2013-08-28 | Discharge: 2013-08-28 | Disposition: A | Discharge status: Home / Self Care | Payer: Self-pay | Source: Ambulatory Visit | Attending: Cardiology | Admitting: Cardiology

## 2013-08-30 ENCOUNTER — Encounter (HOSPITAL_COMMUNITY)
Admission: RE | Admit: 2013-08-30 | Discharge: 2013-08-30 | Disposition: A | Discharge status: Home / Self Care | Payer: Self-pay | Source: Ambulatory Visit | Attending: Cardiology | Admitting: Cardiology

## 2013-08-30 DIAGNOSIS — E039 Hypothyroidism, unspecified: Secondary | ICD-10-CM | POA: Insufficient documentation

## 2013-08-30 DIAGNOSIS — I2510 Atherosclerotic heart disease of native coronary artery without angina pectoris: Secondary | ICD-10-CM | POA: Insufficient documentation

## 2013-08-30 DIAGNOSIS — K219 Gastro-esophageal reflux disease without esophagitis: Secondary | ICD-10-CM | POA: Insufficient documentation

## 2013-08-30 DIAGNOSIS — Z9861 Coronary angioplasty status: Secondary | ICD-10-CM | POA: Insufficient documentation

## 2013-08-30 DIAGNOSIS — E785 Hyperlipidemia, unspecified: Secondary | ICD-10-CM | POA: Insufficient documentation

## 2013-08-30 DIAGNOSIS — I252 Old myocardial infarction: Secondary | ICD-10-CM | POA: Insufficient documentation

## 2013-08-30 DIAGNOSIS — Z5189 Encounter for other specified aftercare: Secondary | ICD-10-CM | POA: Insufficient documentation

## 2013-08-30 DIAGNOSIS — Z7982 Long term (current) use of aspirin: Secondary | ICD-10-CM | POA: Insufficient documentation

## 2013-08-30 DIAGNOSIS — I2582 Chronic total occlusion of coronary artery: Secondary | ICD-10-CM | POA: Insufficient documentation

## 2013-08-31 DIAGNOSIS — Z23 Encounter for immunization: Secondary | ICD-10-CM | POA: Diagnosis not present

## 2013-09-01 ENCOUNTER — Encounter (HOSPITAL_COMMUNITY)
Admission: RE | Admit: 2013-09-01 | Discharge: 2013-09-01 | Disposition: A | Discharge status: Home / Self Care | Payer: Self-pay | Source: Ambulatory Visit | Attending: Cardiology | Admitting: Cardiology

## 2013-09-04 ENCOUNTER — Encounter (HOSPITAL_COMMUNITY)
Admission: RE | Admit: 2013-09-04 | Discharge: 2013-09-04 | Disposition: A | Discharge status: Home / Self Care | Payer: Self-pay | Source: Ambulatory Visit | Attending: Cardiology | Admitting: Cardiology

## 2013-09-06 ENCOUNTER — Encounter (HOSPITAL_COMMUNITY)
Admission: RE | Admit: 2013-09-06 | Discharge: 2013-09-06 | Disposition: A | Discharge status: Home / Self Care | Payer: Self-pay | Source: Ambulatory Visit | Attending: Cardiology | Admitting: Cardiology

## 2013-09-08 ENCOUNTER — Encounter (HOSPITAL_COMMUNITY)
Admission: RE | Admit: 2013-09-08 | Discharge: 2013-09-08 | Disposition: A | Discharge status: Home / Self Care | Payer: Self-pay | Source: Ambulatory Visit | Attending: Cardiology | Admitting: Cardiology

## 2013-09-11 ENCOUNTER — Encounter (HOSPITAL_COMMUNITY)
Admission: RE | Admit: 2013-09-11 | Discharge: 2013-09-11 | Disposition: A | Discharge status: Home / Self Care | Payer: Self-pay | Source: Ambulatory Visit | Attending: Cardiology | Admitting: Cardiology

## 2013-09-13 ENCOUNTER — Encounter (HOSPITAL_COMMUNITY)
Admission: RE | Admit: 2013-09-13 | Discharge: 2013-09-13 | Disposition: A | Discharge status: Home / Self Care | Payer: Self-pay | Source: Ambulatory Visit | Attending: Cardiology | Admitting: Cardiology

## 2013-09-15 ENCOUNTER — Encounter (HOSPITAL_COMMUNITY)
Admission: RE | Admit: 2013-09-15 | Discharge: 2013-09-15 | Disposition: A | Discharge status: Home / Self Care | Payer: Self-pay | Source: Ambulatory Visit | Attending: Cardiology | Admitting: Cardiology

## 2013-09-18 ENCOUNTER — Encounter (HOSPITAL_COMMUNITY)
Admission: RE | Admit: 2013-09-18 | Discharge: 2013-09-18 | Disposition: A | Discharge status: Home / Self Care | Payer: Self-pay | Source: Ambulatory Visit | Attending: Cardiology | Admitting: Cardiology

## 2013-09-20 ENCOUNTER — Encounter (HOSPITAL_COMMUNITY)
Admission: RE | Admit: 2013-09-20 | Discharge: 2013-09-20 | Disposition: A | Discharge status: Home / Self Care | Payer: Self-pay | Source: Ambulatory Visit | Attending: Cardiology | Admitting: Cardiology

## 2013-09-22 ENCOUNTER — Encounter (HOSPITAL_COMMUNITY)
Admission: RE | Admit: 2013-09-22 | Discharge: 2013-09-22 | Disposition: A | Discharge status: Home / Self Care | Payer: Self-pay | Source: Ambulatory Visit | Attending: Cardiology | Admitting: Cardiology

## 2013-09-25 ENCOUNTER — Encounter (HOSPITAL_COMMUNITY)
Admission: RE | Admit: 2013-09-25 | Discharge: 2013-09-25 | Disposition: A | Discharge status: Home / Self Care | Payer: Self-pay | Source: Ambulatory Visit | Attending: Cardiology | Admitting: Cardiology

## 2013-09-27 ENCOUNTER — Encounter (HOSPITAL_COMMUNITY)
Admission: RE | Admit: 2013-09-27 | Discharge: 2013-09-27 | Disposition: A | Discharge status: Home / Self Care | Payer: Self-pay | Source: Ambulatory Visit | Attending: Cardiology | Admitting: Cardiology

## 2013-09-29 ENCOUNTER — Encounter (HOSPITAL_COMMUNITY)
Admission: RE | Admit: 2013-09-29 | Discharge: 2013-09-29 | Disposition: A | Discharge status: Home / Self Care | Payer: Self-pay | Source: Ambulatory Visit | Attending: Cardiology | Admitting: Cardiology

## 2013-10-02 ENCOUNTER — Encounter (HOSPITAL_COMMUNITY)
Admission: RE | Admit: 2013-10-02 | Discharge: 2013-10-02 | Disposition: A | Discharge status: Home / Self Care | Payer: Self-pay | Source: Ambulatory Visit | Attending: Cardiology | Admitting: Cardiology

## 2013-10-02 DIAGNOSIS — I2510 Atherosclerotic heart disease of native coronary artery without angina pectoris: Secondary | ICD-10-CM | POA: Insufficient documentation

## 2013-10-02 DIAGNOSIS — Z9861 Coronary angioplasty status: Secondary | ICD-10-CM | POA: Insufficient documentation

## 2013-10-02 DIAGNOSIS — I252 Old myocardial infarction: Secondary | ICD-10-CM | POA: Insufficient documentation

## 2013-10-02 DIAGNOSIS — Z5189 Encounter for other specified aftercare: Secondary | ICD-10-CM | POA: Insufficient documentation

## 2013-10-04 ENCOUNTER — Encounter (HOSPITAL_COMMUNITY)
Admission: RE | Admit: 2013-10-04 | Discharge: 2013-10-04 | Disposition: A | Discharge status: Home / Self Care | Payer: Self-pay | Source: Ambulatory Visit | Attending: Cardiology | Admitting: Cardiology

## 2013-10-06 ENCOUNTER — Encounter (HOSPITAL_COMMUNITY)
Admission: RE | Admit: 2013-10-06 | Discharge: 2013-10-06 | Disposition: A | Discharge status: Home / Self Care | Payer: Self-pay | Source: Ambulatory Visit | Attending: Cardiology | Admitting: Cardiology

## 2013-10-08 DIAGNOSIS — N400 Enlarged prostate without lower urinary tract symptoms: Secondary | ICD-10-CM | POA: Insufficient documentation

## 2013-10-09 ENCOUNTER — Encounter (HOSPITAL_COMMUNITY)
Admission: RE | Admit: 2013-10-09 | Discharge: 2013-10-09 | Disposition: A | Discharge status: Home / Self Care | Payer: Self-pay | Source: Ambulatory Visit | Attending: Cardiology | Admitting: Cardiology

## 2013-10-09 DIAGNOSIS — D239 Other benign neoplasm of skin, unspecified: Secondary | ICD-10-CM | POA: Diagnosis not present

## 2013-10-09 DIAGNOSIS — E039 Hypothyroidism, unspecified: Secondary | ICD-10-CM | POA: Diagnosis not present

## 2013-10-09 DIAGNOSIS — I2510 Atherosclerotic heart disease of native coronary artery without angina pectoris: Secondary | ICD-10-CM | POA: Diagnosis not present

## 2013-10-09 DIAGNOSIS — N400 Enlarged prostate without lower urinary tract symptoms: Secondary | ICD-10-CM | POA: Diagnosis not present

## 2013-10-09 DIAGNOSIS — I252 Old myocardial infarction: Secondary | ICD-10-CM | POA: Diagnosis not present

## 2013-10-09 DIAGNOSIS — R030 Elevated blood-pressure reading, without diagnosis of hypertension: Secondary | ICD-10-CM | POA: Diagnosis not present

## 2013-10-09 DIAGNOSIS — E538 Deficiency of other specified B group vitamins: Secondary | ICD-10-CM | POA: Diagnosis not present

## 2013-10-09 DIAGNOSIS — E785 Hyperlipidemia, unspecified: Secondary | ICD-10-CM | POA: Diagnosis not present

## 2013-10-11 ENCOUNTER — Encounter (HOSPITAL_COMMUNITY)
Admission: RE | Admit: 2013-10-11 | Discharge: 2013-10-11 | Disposition: A | Discharge status: Home / Self Care | Payer: Self-pay | Source: Ambulatory Visit | Attending: Cardiology | Admitting: Cardiology

## 2013-10-13 ENCOUNTER — Encounter (HOSPITAL_COMMUNITY): Payer: Medicare Other

## 2013-10-16 ENCOUNTER — Encounter (HOSPITAL_COMMUNITY)
Admission: RE | Admit: 2013-10-16 | Discharge: 2013-10-16 | Disposition: A | Discharge status: Home / Self Care | Payer: Self-pay | Source: Ambulatory Visit | Attending: Cardiology | Admitting: Cardiology

## 2013-10-18 ENCOUNTER — Encounter (HOSPITAL_COMMUNITY)
Admission: RE | Admit: 2013-10-18 | Discharge: 2013-10-18 | Disposition: A | Discharge status: Home / Self Care | Payer: Self-pay | Source: Ambulatory Visit | Attending: Cardiology | Admitting: Cardiology

## 2013-10-20 ENCOUNTER — Encounter (HOSPITAL_COMMUNITY)
Admission: RE | Admit: 2013-10-20 | Discharge: 2013-10-20 | Disposition: A | Discharge status: Home / Self Care | Payer: Self-pay | Source: Ambulatory Visit | Attending: Cardiology | Admitting: Cardiology

## 2013-10-23 ENCOUNTER — Encounter (HOSPITAL_COMMUNITY)
Admission: RE | Admit: 2013-10-23 | Discharge: 2013-10-23 | Disposition: A | Discharge status: Home / Self Care | Payer: Self-pay | Source: Ambulatory Visit | Attending: Cardiology | Admitting: Cardiology

## 2013-10-25 ENCOUNTER — Encounter (HOSPITAL_COMMUNITY)
Admission: RE | Admit: 2013-10-25 | Discharge: 2013-10-25 | Disposition: A | Discharge status: Home / Self Care | Payer: Self-pay | Source: Ambulatory Visit | Attending: Cardiology | Admitting: Cardiology

## 2013-10-30 ENCOUNTER — Encounter (HOSPITAL_COMMUNITY): Payer: Medicare Other

## 2013-10-30 DIAGNOSIS — I252 Old myocardial infarction: Secondary | ICD-10-CM | POA: Insufficient documentation

## 2013-10-30 DIAGNOSIS — I2510 Atherosclerotic heart disease of native coronary artery without angina pectoris: Secondary | ICD-10-CM | POA: Insufficient documentation

## 2013-10-30 DIAGNOSIS — Z9861 Coronary angioplasty status: Secondary | ICD-10-CM | POA: Insufficient documentation

## 2013-10-30 DIAGNOSIS — Z5189 Encounter for other specified aftercare: Secondary | ICD-10-CM | POA: Insufficient documentation

## 2013-11-01 ENCOUNTER — Encounter (HOSPITAL_COMMUNITY): Payer: Medicare Other

## 2013-11-03 ENCOUNTER — Encounter (HOSPITAL_COMMUNITY)
Admission: RE | Admit: 2013-11-03 | Discharge: 2013-11-03 | Disposition: A | Discharge status: Home / Self Care | Payer: Self-pay | Source: Ambulatory Visit | Attending: Cardiology | Admitting: Cardiology

## 2013-11-06 ENCOUNTER — Encounter (HOSPITAL_COMMUNITY): Payer: Medicare Other

## 2013-11-08 ENCOUNTER — Encounter (HOSPITAL_COMMUNITY): Payer: Medicare Other

## 2013-11-10 ENCOUNTER — Encounter (HOSPITAL_COMMUNITY): Payer: Medicare Other

## 2013-11-13 ENCOUNTER — Encounter (HOSPITAL_COMMUNITY)
Admission: RE | Admit: 2013-11-13 | Discharge: 2013-11-13 | Disposition: A | Discharge status: Home / Self Care | Payer: Self-pay | Source: Ambulatory Visit | Attending: Cardiology | Admitting: Cardiology

## 2013-11-15 ENCOUNTER — Encounter (HOSPITAL_COMMUNITY): Payer: Medicare Other

## 2013-11-17 ENCOUNTER — Encounter (HOSPITAL_COMMUNITY)
Admission: RE | Admit: 2013-11-17 | Discharge: 2013-11-17 | Disposition: A | Discharge status: Home / Self Care | Payer: Self-pay | Source: Ambulatory Visit | Attending: Cardiology | Admitting: Cardiology

## 2013-11-20 ENCOUNTER — Encounter (HOSPITAL_COMMUNITY)
Admission: RE | Admit: 2013-11-20 | Discharge: 2013-11-20 | Disposition: A | Discharge status: Home / Self Care | Payer: Self-pay | Source: Ambulatory Visit | Attending: Cardiology | Admitting: Cardiology

## 2013-11-22 ENCOUNTER — Encounter (HOSPITAL_COMMUNITY)
Admission: RE | Admit: 2013-11-22 | Discharge: 2013-11-22 | Disposition: A | Discharge status: Home / Self Care | Payer: Self-pay | Source: Ambulatory Visit | Attending: Cardiology | Admitting: Cardiology

## 2013-11-27 ENCOUNTER — Encounter (HOSPITAL_COMMUNITY): Payer: Medicare Other

## 2013-11-29 ENCOUNTER — Encounter (HOSPITAL_COMMUNITY)
Admission: RE | Admit: 2013-11-29 | Discharge: 2013-11-29 | Disposition: A | Discharge status: Home / Self Care | Payer: Medicare Other | Source: Ambulatory Visit | Attending: Cardiology | Admitting: Cardiology

## 2013-12-01 ENCOUNTER — Encounter (HOSPITAL_COMMUNITY)
Admission: RE | Admit: 2013-12-01 | Discharge: 2013-12-01 | Disposition: A | Discharge status: Home / Self Care | Payer: Self-pay | Source: Ambulatory Visit | Attending: Cardiology | Admitting: Cardiology

## 2013-12-01 DIAGNOSIS — I2510 Atherosclerotic heart disease of native coronary artery without angina pectoris: Secondary | ICD-10-CM | POA: Insufficient documentation

## 2013-12-01 DIAGNOSIS — Z5189 Encounter for other specified aftercare: Secondary | ICD-10-CM | POA: Insufficient documentation

## 2013-12-01 DIAGNOSIS — Z9861 Coronary angioplasty status: Secondary | ICD-10-CM | POA: Insufficient documentation

## 2013-12-01 DIAGNOSIS — I252 Old myocardial infarction: Secondary | ICD-10-CM | POA: Insufficient documentation

## 2013-12-04 ENCOUNTER — Encounter (HOSPITAL_COMMUNITY)
Admission: RE | Admit: 2013-12-04 | Discharge: 2013-12-04 | Disposition: A | Discharge status: Home / Self Care | Payer: Self-pay | Source: Ambulatory Visit | Attending: Cardiology | Admitting: Cardiology

## 2013-12-06 ENCOUNTER — Encounter (HOSPITAL_COMMUNITY)
Admission: RE | Admit: 2013-12-06 | Discharge: 2013-12-06 | Disposition: A | Discharge status: Home / Self Care | Payer: Self-pay | Source: Ambulatory Visit | Attending: Cardiology | Admitting: Cardiology

## 2013-12-08 ENCOUNTER — Encounter (HOSPITAL_COMMUNITY)
Admission: RE | Admit: 2013-12-08 | Discharge: 2013-12-08 | Disposition: A | Discharge status: Home / Self Care | Payer: Self-pay | Source: Ambulatory Visit | Attending: Cardiology | Admitting: Cardiology

## 2013-12-08 ENCOUNTER — Other Ambulatory Visit: Payer: Self-pay

## 2013-12-08 MED ORDER — CLOPIDOGREL BISULFATE 75 MG PO TABS: ORAL_TABLET | ORAL | Status: DC

## 2013-12-11 ENCOUNTER — Encounter (HOSPITAL_COMMUNITY)
Admission: RE | Admit: 2013-12-11 | Discharge: 2013-12-11 | Disposition: A | Discharge status: Home / Self Care | Payer: Self-pay | Source: Ambulatory Visit | Attending: Cardiology | Admitting: Cardiology

## 2013-12-13 ENCOUNTER — Encounter (HOSPITAL_COMMUNITY)
Admission: RE | Admit: 2013-12-13 | Discharge: 2013-12-13 | Disposition: A | Discharge status: Home / Self Care | Payer: Self-pay | Source: Ambulatory Visit | Attending: Cardiology | Admitting: Cardiology

## 2013-12-15 ENCOUNTER — Encounter (HOSPITAL_COMMUNITY): Payer: Medicare Other

## 2013-12-18 ENCOUNTER — Encounter (HOSPITAL_COMMUNITY)
Admission: RE | Admit: 2013-12-18 | Discharge: 2013-12-18 | Disposition: A | Discharge status: Home / Self Care | Payer: Self-pay | Source: Ambulatory Visit | Attending: Cardiology | Admitting: Cardiology

## 2013-12-20 ENCOUNTER — Encounter (HOSPITAL_COMMUNITY)
Admission: RE | Admit: 2013-12-20 | Discharge: 2013-12-20 | Disposition: A | Discharge status: Home / Self Care | Payer: Self-pay | Source: Ambulatory Visit | Attending: Cardiology | Admitting: Cardiology

## 2013-12-22 ENCOUNTER — Encounter (HOSPITAL_COMMUNITY)
Admission: RE | Admit: 2013-12-22 | Discharge: 2013-12-22 | Disposition: A | Discharge status: Home / Self Care | Payer: Self-pay | Source: Ambulatory Visit | Attending: Cardiology | Admitting: Cardiology

## 2013-12-25 ENCOUNTER — Encounter (HOSPITAL_COMMUNITY)
Admission: RE | Admit: 2013-12-25 | Discharge: 2013-12-25 | Disposition: A | Discharge status: Home / Self Care | Payer: Self-pay | Source: Ambulatory Visit | Attending: Cardiology | Admitting: Cardiology

## 2013-12-27 ENCOUNTER — Encounter (HOSPITAL_COMMUNITY)
Admission: RE | Admit: 2013-12-27 | Discharge: 2013-12-27 | Disposition: A | Discharge status: Home / Self Care | Payer: Medicare Other | Source: Ambulatory Visit | Attending: Cardiology | Admitting: Cardiology

## 2013-12-28 ENCOUNTER — Telehealth (HOSPITAL_COMMUNITY): Payer: Self-pay | Admitting: *Deleted

## 2013-12-28 NOTE — Telephone Encounter (Signed)
Opened in error

## 2013-12-29 ENCOUNTER — Encounter (HOSPITAL_COMMUNITY)
Admission: RE | Admit: 2013-12-29 | Discharge: 2013-12-29 | Disposition: A | Discharge status: Home / Self Care | Payer: Self-pay | Source: Ambulatory Visit | Attending: Cardiology | Admitting: Cardiology

## 2014-01-01 ENCOUNTER — Encounter (HOSPITAL_COMMUNITY)
Admission: RE | Admit: 2014-01-01 | Discharge: 2014-01-01 | Disposition: A | Discharge status: Home / Self Care | Payer: Self-pay | Source: Ambulatory Visit | Attending: Cardiology | Admitting: Cardiology

## 2014-01-01 DIAGNOSIS — Z9861 Coronary angioplasty status: Secondary | ICD-10-CM | POA: Insufficient documentation

## 2014-01-01 DIAGNOSIS — I252 Old myocardial infarction: Secondary | ICD-10-CM | POA: Insufficient documentation

## 2014-01-01 DIAGNOSIS — Z5189 Encounter for other specified aftercare: Secondary | ICD-10-CM | POA: Insufficient documentation

## 2014-01-01 DIAGNOSIS — I2510 Atherosclerotic heart disease of native coronary artery without angina pectoris: Secondary | ICD-10-CM | POA: Insufficient documentation

## 2014-01-03 ENCOUNTER — Encounter (HOSPITAL_COMMUNITY)
Admission: RE | Admit: 2014-01-03 | Discharge: 2014-01-03 | Disposition: A | Discharge status: Home / Self Care | Payer: Self-pay | Source: Ambulatory Visit | Attending: Cardiology | Admitting: Cardiology

## 2014-01-05 ENCOUNTER — Encounter (HOSPITAL_COMMUNITY)
Admission: RE | Admit: 2014-01-05 | Discharge: 2014-01-05 | Disposition: A | Discharge status: Home / Self Care | Payer: Self-pay | Source: Ambulatory Visit | Attending: Cardiology | Admitting: Cardiology

## 2014-01-08 ENCOUNTER — Encounter (HOSPITAL_COMMUNITY): Payer: Medicare Other

## 2014-01-10 ENCOUNTER — Encounter (HOSPITAL_COMMUNITY): Payer: Medicare Other

## 2014-01-12 ENCOUNTER — Encounter (HOSPITAL_COMMUNITY)
Admission: RE | Admit: 2014-01-12 | Discharge: 2014-01-12 | Disposition: A | Discharge status: Home / Self Care | Payer: Medicare Other | Source: Ambulatory Visit | Attending: Cardiology | Admitting: Cardiology

## 2014-01-15 ENCOUNTER — Encounter (HOSPITAL_COMMUNITY): Payer: Medicare Other

## 2014-01-17 ENCOUNTER — Encounter (HOSPITAL_COMMUNITY): Payer: Medicare Other

## 2014-01-19 ENCOUNTER — Encounter (HOSPITAL_COMMUNITY): Payer: Medicare Other

## 2014-01-22 ENCOUNTER — Encounter (HOSPITAL_COMMUNITY): Payer: Medicare Other

## 2014-01-24 ENCOUNTER — Encounter (HOSPITAL_COMMUNITY): Payer: Medicare Other

## 2014-01-26 ENCOUNTER — Encounter (HOSPITAL_COMMUNITY): Payer: Medicare Other

## 2014-01-29 ENCOUNTER — Encounter (HOSPITAL_COMMUNITY)
Admission: RE | Admit: 2014-01-29 | Discharge: 2014-01-29 | Disposition: A | Discharge status: Home / Self Care | Payer: Self-pay | Source: Ambulatory Visit | Attending: Cardiology | Admitting: Cardiology

## 2014-01-29 DIAGNOSIS — Z9861 Coronary angioplasty status: Secondary | ICD-10-CM | POA: Insufficient documentation

## 2014-01-29 DIAGNOSIS — I2510 Atherosclerotic heart disease of native coronary artery without angina pectoris: Secondary | ICD-10-CM | POA: Insufficient documentation

## 2014-01-29 DIAGNOSIS — I252 Old myocardial infarction: Secondary | ICD-10-CM | POA: Insufficient documentation

## 2014-01-29 DIAGNOSIS — Z5189 Encounter for other specified aftercare: Secondary | ICD-10-CM | POA: Insufficient documentation

## 2014-01-31 ENCOUNTER — Encounter (HOSPITAL_COMMUNITY)
Admission: RE | Admit: 2014-01-31 | Discharge: 2014-01-31 | Disposition: A | Discharge status: Home / Self Care | Payer: Self-pay | Source: Ambulatory Visit | Attending: Cardiology | Admitting: Cardiology

## 2014-02-02 ENCOUNTER — Encounter (HOSPITAL_COMMUNITY)
Admission: RE | Admit: 2014-02-02 | Discharge: 2014-02-02 | Disposition: A | Discharge status: Home / Self Care | Payer: Self-pay | Source: Ambulatory Visit | Attending: Cardiology | Admitting: Cardiology

## 2014-02-05 ENCOUNTER — Encounter (HOSPITAL_COMMUNITY)
Admission: RE | Admit: 2014-02-05 | Discharge: 2014-02-05 | Disposition: A | Discharge status: Home / Self Care | Payer: Medicare Other | Source: Ambulatory Visit | Attending: Cardiology | Admitting: Cardiology

## 2014-02-07 ENCOUNTER — Encounter (HOSPITAL_COMMUNITY)
Admission: RE | Admit: 2014-02-07 | Discharge: 2014-02-07 | Disposition: A | Discharge status: Home / Self Care | Payer: Self-pay | Source: Ambulatory Visit | Attending: Cardiology | Admitting: Cardiology

## 2014-02-09 ENCOUNTER — Encounter (HOSPITAL_COMMUNITY)
Admission: RE | Admit: 2014-02-09 | Discharge: 2014-02-09 | Disposition: A | Discharge status: Home / Self Care | Payer: Self-pay | Source: Ambulatory Visit | Attending: Cardiology | Admitting: Cardiology

## 2014-02-12 ENCOUNTER — Encounter (HOSPITAL_COMMUNITY)
Admission: RE | Admit: 2014-02-12 | Discharge: 2014-02-12 | Disposition: A | Discharge status: Home / Self Care | Payer: Self-pay | Source: Ambulatory Visit | Attending: Cardiology | Admitting: Cardiology

## 2014-02-14 ENCOUNTER — Encounter (HOSPITAL_COMMUNITY)
Admission: RE | Admit: 2014-02-14 | Discharge: 2014-02-14 | Disposition: A | Discharge status: Home / Self Care | Payer: Self-pay | Source: Ambulatory Visit | Attending: Cardiology | Admitting: Cardiology

## 2014-02-16 ENCOUNTER — Encounter (HOSPITAL_COMMUNITY)
Admission: RE | Admit: 2014-02-16 | Discharge: 2014-02-16 | Disposition: A | Discharge status: Home / Self Care | Payer: Self-pay | Source: Ambulatory Visit | Attending: Cardiology | Admitting: Cardiology

## 2014-02-19 ENCOUNTER — Encounter (HOSPITAL_COMMUNITY)
Admission: RE | Admit: 2014-02-19 | Discharge: 2014-02-19 | Disposition: A | Discharge status: Home / Self Care | Payer: Self-pay | Source: Ambulatory Visit | Attending: Cardiology | Admitting: Cardiology

## 2014-02-21 ENCOUNTER — Encounter (HOSPITAL_COMMUNITY)
Admission: RE | Admit: 2014-02-21 | Discharge: 2014-02-21 | Disposition: A | Discharge status: Home / Self Care | Payer: Self-pay | Source: Ambulatory Visit | Attending: Cardiology | Admitting: Cardiology

## 2014-02-23 ENCOUNTER — Encounter (HOSPITAL_COMMUNITY)
Admission: RE | Admit: 2014-02-23 | Discharge: 2014-02-23 | Disposition: A | Discharge status: Home / Self Care | Payer: Self-pay | Source: Ambulatory Visit | Attending: Cardiology | Admitting: Cardiology

## 2014-02-26 ENCOUNTER — Encounter (HOSPITAL_COMMUNITY)
Admission: RE | Admit: 2014-02-26 | Discharge: 2014-02-26 | Disposition: A | Discharge status: Home / Self Care | Payer: Self-pay | Source: Ambulatory Visit | Attending: Cardiology | Admitting: Cardiology

## 2014-02-28 ENCOUNTER — Encounter (HOSPITAL_COMMUNITY)
Admission: RE | Admit: 2014-02-28 | Discharge: 2014-02-28 | Disposition: A | Discharge status: Home / Self Care | Payer: Self-pay | Source: Ambulatory Visit | Attending: Cardiology | Admitting: Cardiology

## 2014-02-28 DIAGNOSIS — I2510 Atherosclerotic heart disease of native coronary artery without angina pectoris: Secondary | ICD-10-CM | POA: Insufficient documentation

## 2014-02-28 DIAGNOSIS — Z5189 Encounter for other specified aftercare: Secondary | ICD-10-CM | POA: Insufficient documentation

## 2014-02-28 DIAGNOSIS — I252 Old myocardial infarction: Secondary | ICD-10-CM | POA: Insufficient documentation

## 2014-02-28 DIAGNOSIS — Z9861 Coronary angioplasty status: Secondary | ICD-10-CM | POA: Insufficient documentation

## 2014-03-02 ENCOUNTER — Encounter (HOSPITAL_COMMUNITY)
Admission: RE | Admit: 2014-03-02 | Discharge: 2014-03-02 | Disposition: A | Discharge status: Home / Self Care | Payer: Self-pay | Source: Ambulatory Visit | Attending: Cardiology | Admitting: Cardiology

## 2014-03-05 ENCOUNTER — Encounter (HOSPITAL_COMMUNITY)
Admission: RE | Admit: 2014-03-05 | Discharge: 2014-03-05 | Disposition: A | Discharge status: Home / Self Care | Payer: Self-pay | Source: Ambulatory Visit | Attending: Cardiology | Admitting: Cardiology

## 2014-03-07 ENCOUNTER — Encounter (HOSPITAL_COMMUNITY)
Admission: RE | Admit: 2014-03-07 | Discharge: 2014-03-07 | Disposition: A | Discharge status: Home / Self Care | Payer: Self-pay | Source: Ambulatory Visit | Attending: Cardiology | Admitting: Cardiology

## 2014-03-09 ENCOUNTER — Encounter (HOSPITAL_COMMUNITY)
Admission: RE | Admit: 2014-03-09 | Discharge: 2014-03-09 | Disposition: A | Discharge status: Home / Self Care | Payer: Self-pay | Source: Ambulatory Visit | Attending: Cardiology | Admitting: Cardiology

## 2014-03-12 ENCOUNTER — Encounter (HOSPITAL_COMMUNITY): Payer: Medicare Other

## 2014-03-14 ENCOUNTER — Encounter (HOSPITAL_COMMUNITY): Payer: Medicare Other

## 2014-03-16 ENCOUNTER — Encounter (HOSPITAL_COMMUNITY)
Admission: RE | Admit: 2014-03-16 | Discharge: 2014-03-16 | Disposition: A | Discharge status: Home / Self Care | Payer: Self-pay | Source: Ambulatory Visit | Attending: Cardiology | Admitting: Cardiology

## 2014-03-19 ENCOUNTER — Encounter (HOSPITAL_COMMUNITY)
Admission: RE | Admit: 2014-03-19 | Discharge: 2014-03-19 | Disposition: A | Discharge status: Home / Self Care | Payer: Self-pay | Source: Ambulatory Visit | Attending: Cardiology | Admitting: Cardiology

## 2014-03-21 ENCOUNTER — Other Ambulatory Visit: Payer: Self-pay | Admitting: *Deleted

## 2014-03-21 ENCOUNTER — Encounter (HOSPITAL_COMMUNITY)
Admission: RE | Admit: 2014-03-21 | Discharge: 2014-03-21 | Disposition: A | Discharge status: Home / Self Care | Payer: Self-pay | Source: Ambulatory Visit | Attending: Cardiology | Admitting: Cardiology

## 2014-03-21 MED ORDER — ROSUVASTATIN CALCIUM 10 MG PO TABS: ORAL_TABLET | ORAL | Status: DC

## 2014-03-23 ENCOUNTER — Encounter (HOSPITAL_COMMUNITY)
Admission: RE | Admit: 2014-03-23 | Discharge: 2014-03-23 | Disposition: A | Discharge status: Home / Self Care | Payer: Self-pay | Source: Ambulatory Visit | Attending: Cardiology | Admitting: Cardiology

## 2014-03-26 ENCOUNTER — Encounter (HOSPITAL_COMMUNITY): Payer: Medicare Other

## 2014-03-28 ENCOUNTER — Encounter (HOSPITAL_COMMUNITY): Payer: Medicare Other

## 2014-03-30 ENCOUNTER — Encounter (HOSPITAL_COMMUNITY): Payer: Self-pay

## 2014-03-30 DIAGNOSIS — Z5189 Encounter for other specified aftercare: Secondary | ICD-10-CM | POA: Insufficient documentation

## 2014-03-30 DIAGNOSIS — I2510 Atherosclerotic heart disease of native coronary artery without angina pectoris: Secondary | ICD-10-CM | POA: Insufficient documentation

## 2014-03-30 DIAGNOSIS — E785 Hyperlipidemia, unspecified: Secondary | ICD-10-CM | POA: Insufficient documentation

## 2014-04-02 ENCOUNTER — Encounter (HOSPITAL_COMMUNITY)
Admission: RE | Admit: 2014-04-02 | Discharge: 2014-04-02 | Disposition: A | Discharge status: Home / Self Care | Payer: Self-pay | Source: Ambulatory Visit | Attending: Cardiology | Admitting: Cardiology

## 2014-04-04 ENCOUNTER — Encounter (HOSPITAL_COMMUNITY)
Admission: RE | Admit: 2014-04-04 | Discharge: 2014-04-04 | Disposition: A | Discharge status: Home / Self Care | Payer: Self-pay | Source: Ambulatory Visit | Attending: Cardiology | Admitting: Cardiology

## 2014-04-06 ENCOUNTER — Encounter (HOSPITAL_COMMUNITY): Payer: Self-pay

## 2014-04-06 DIAGNOSIS — E538 Deficiency of other specified B group vitamins: Secondary | ICD-10-CM | POA: Diagnosis not present

## 2014-04-06 DIAGNOSIS — I2510 Atherosclerotic heart disease of native coronary artery without angina pectoris: Secondary | ICD-10-CM | POA: Diagnosis not present

## 2014-04-09 ENCOUNTER — Encounter (HOSPITAL_COMMUNITY)
Admission: RE | Admit: 2014-04-09 | Discharge: 2014-04-09 | Disposition: A | Discharge status: Home / Self Care | Payer: Self-pay | Source: Ambulatory Visit | Attending: Cardiology | Admitting: Cardiology

## 2014-04-09 DIAGNOSIS — E039 Hypothyroidism, unspecified: Secondary | ICD-10-CM | POA: Diagnosis not present

## 2014-04-09 DIAGNOSIS — E538 Deficiency of other specified B group vitamins: Secondary | ICD-10-CM | POA: Diagnosis not present

## 2014-04-09 DIAGNOSIS — E785 Hyperlipidemia, unspecified: Secondary | ICD-10-CM | POA: Diagnosis not present

## 2014-04-09 DIAGNOSIS — R030 Elevated blood-pressure reading, without diagnosis of hypertension: Secondary | ICD-10-CM | POA: Diagnosis not present

## 2014-04-09 DIAGNOSIS — Z125 Encounter for screening for malignant neoplasm of prostate: Secondary | ICD-10-CM | POA: Diagnosis not present

## 2014-04-11 ENCOUNTER — Encounter (HOSPITAL_COMMUNITY)
Admission: RE | Admit: 2014-04-11 | Discharge: 2014-04-11 | Disposition: A | Discharge status: Home / Self Care | Payer: Self-pay | Source: Ambulatory Visit | Attending: Cardiology | Admitting: Cardiology

## 2014-04-13 ENCOUNTER — Encounter (HOSPITAL_COMMUNITY)
Admission: RE | Admit: 2014-04-13 | Discharge: 2014-04-13 | Disposition: A | Discharge status: Home / Self Care | Payer: Self-pay | Source: Ambulatory Visit | Attending: Cardiology | Admitting: Cardiology

## 2014-04-16 ENCOUNTER — Encounter (HOSPITAL_COMMUNITY)
Admission: RE | Admit: 2014-04-16 | Discharge: 2014-04-16 | Disposition: A | Discharge status: Home / Self Care | Payer: Self-pay | Source: Ambulatory Visit | Attending: Cardiology | Admitting: Cardiology

## 2014-04-16 DIAGNOSIS — Z125 Encounter for screening for malignant neoplasm of prostate: Secondary | ICD-10-CM | POA: Diagnosis not present

## 2014-04-16 DIAGNOSIS — E785 Hyperlipidemia, unspecified: Secondary | ICD-10-CM | POA: Diagnosis not present

## 2014-04-16 DIAGNOSIS — I2510 Atherosclerotic heart disease of native coronary artery without angina pectoris: Secondary | ICD-10-CM | POA: Diagnosis not present

## 2014-04-16 DIAGNOSIS — Z0000 Encounter for general adult medical examination without abnormal findings: Secondary | ICD-10-CM | POA: Diagnosis not present

## 2014-04-16 DIAGNOSIS — E538 Deficiency of other specified B group vitamins: Secondary | ICD-10-CM | POA: Diagnosis not present

## 2014-04-16 DIAGNOSIS — D239 Other benign neoplasm of skin, unspecified: Secondary | ICD-10-CM | POA: Diagnosis not present

## 2014-04-16 DIAGNOSIS — R030 Elevated blood-pressure reading, without diagnosis of hypertension: Secondary | ICD-10-CM | POA: Diagnosis not present

## 2014-04-16 DIAGNOSIS — Z23 Encounter for immunization: Secondary | ICD-10-CM | POA: Diagnosis not present

## 2014-04-16 DIAGNOSIS — E039 Hypothyroidism, unspecified: Secondary | ICD-10-CM | POA: Diagnosis not present

## 2014-04-16 DIAGNOSIS — N400 Enlarged prostate without lower urinary tract symptoms: Secondary | ICD-10-CM | POA: Diagnosis not present

## 2014-04-18 ENCOUNTER — Encounter (HOSPITAL_COMMUNITY)
Admission: RE | Admit: 2014-04-18 | Discharge: 2014-04-18 | Disposition: A | Discharge status: Home / Self Care | Payer: Self-pay | Source: Ambulatory Visit | Attending: Cardiology | Admitting: Cardiology

## 2014-04-20 ENCOUNTER — Encounter (HOSPITAL_COMMUNITY)
Admission: RE | Admit: 2014-04-20 | Discharge: 2014-04-20 | Disposition: A | Discharge status: Home / Self Care | Payer: Self-pay | Source: Ambulatory Visit | Attending: Cardiology | Admitting: Cardiology

## 2014-04-25 ENCOUNTER — Encounter (HOSPITAL_COMMUNITY)
Admission: RE | Admit: 2014-04-25 | Discharge: 2014-04-25 | Disposition: A | Discharge status: Home / Self Care | Payer: Self-pay | Source: Ambulatory Visit | Attending: Cardiology | Admitting: Cardiology

## 2014-04-27 ENCOUNTER — Encounter (HOSPITAL_COMMUNITY)
Admission: RE | Admit: 2014-04-27 | Discharge: 2014-04-27 | Disposition: A | Discharge status: Home / Self Care | Payer: Self-pay | Source: Ambulatory Visit | Attending: Cardiology | Admitting: Cardiology

## 2014-04-30 ENCOUNTER — Encounter (HOSPITAL_COMMUNITY)
Admission: RE | Admit: 2014-04-30 | Discharge: 2014-04-30 | Disposition: A | Discharge status: Home / Self Care | Payer: Self-pay | Source: Ambulatory Visit | Attending: Cardiology | Admitting: Cardiology

## 2014-04-30 DIAGNOSIS — I252 Old myocardial infarction: Secondary | ICD-10-CM | POA: Insufficient documentation

## 2014-04-30 DIAGNOSIS — Z5189 Encounter for other specified aftercare: Secondary | ICD-10-CM | POA: Insufficient documentation

## 2014-04-30 DIAGNOSIS — Z9861 Coronary angioplasty status: Secondary | ICD-10-CM | POA: Insufficient documentation

## 2014-04-30 DIAGNOSIS — I2510 Atherosclerotic heart disease of native coronary artery without angina pectoris: Secondary | ICD-10-CM | POA: Insufficient documentation

## 2014-04-30 DIAGNOSIS — E785 Hyperlipidemia, unspecified: Secondary | ICD-10-CM | POA: Insufficient documentation

## 2014-05-02 ENCOUNTER — Encounter (HOSPITAL_COMMUNITY)
Admission: RE | Admit: 2014-05-02 | Discharge: 2014-05-02 | Disposition: A | Discharge status: Home / Self Care | Payer: Self-pay | Source: Ambulatory Visit | Attending: Cardiology | Admitting: Cardiology

## 2014-05-04 ENCOUNTER — Encounter (HOSPITAL_COMMUNITY): Payer: Self-pay

## 2014-05-07 ENCOUNTER — Encounter (HOSPITAL_COMMUNITY)
Admission: RE | Admit: 2014-05-07 | Discharge: 2014-05-07 | Disposition: A | Discharge status: Home / Self Care | Payer: Self-pay | Source: Ambulatory Visit | Attending: Cardiology | Admitting: Cardiology

## 2014-05-09 ENCOUNTER — Encounter (HOSPITAL_COMMUNITY)
Admission: RE | Admit: 2014-05-09 | Discharge: 2014-05-09 | Disposition: A | Discharge status: Home / Self Care | Payer: Medicare Other | Source: Ambulatory Visit | Attending: Cardiology | Admitting: Cardiology

## 2014-05-11 ENCOUNTER — Encounter (HOSPITAL_COMMUNITY)
Admission: RE | Admit: 2014-05-11 | Discharge: 2014-05-11 | Disposition: A | Discharge status: Home / Self Care | Payer: Self-pay | Source: Ambulatory Visit | Attending: Cardiology | Admitting: Cardiology

## 2014-05-14 ENCOUNTER — Ambulatory Visit: Payer: Medicare Other | Admitting: Cardiology

## 2014-05-14 ENCOUNTER — Encounter (HOSPITAL_COMMUNITY)
Admission: RE | Admit: 2014-05-14 | Discharge: 2014-05-14 | Disposition: A | Discharge status: Home / Self Care | Payer: Self-pay | Source: Ambulatory Visit | Attending: Cardiology | Admitting: Cardiology

## 2014-05-14 ENCOUNTER — Encounter: Payer: Self-pay | Admitting: Cardiology

## 2014-05-14 ENCOUNTER — Ambulatory Visit (INDEPENDENT_AMBULATORY_CARE_PROVIDER_SITE_OTHER): Payer: Medicare Other | Admitting: Cardiology

## 2014-05-14 VITALS — BP 134/84 | HR 53 | Ht 70.0 in | Wt 176.1 lb

## 2014-05-14 DIAGNOSIS — E785 Hyperlipidemia, unspecified: Secondary | ICD-10-CM

## 2014-05-14 DIAGNOSIS — D239 Other benign neoplasm of skin, unspecified: Secondary | ICD-10-CM | POA: Diagnosis not present

## 2014-05-14 DIAGNOSIS — I2510 Atherosclerotic heart disease of native coronary artery without angina pectoris: Secondary | ICD-10-CM

## 2014-05-14 DIAGNOSIS — L821 Other seborrheic keratosis: Secondary | ICD-10-CM | POA: Diagnosis not present

## 2014-05-14 DIAGNOSIS — L570 Actinic keratosis: Secondary | ICD-10-CM | POA: Diagnosis not present

## 2014-05-14 NOTE — Patient Instructions (Addendum)
Try stopping Crestor for 3 weeks and see if your leg fatigue improves. If not then its not the Crestor and you should resume it. If your symptoms do improve then try Crestor again and see if the symptoms recur.  Your physician has requested that you have an exercise tolerance test. For further information please visit [email or web address]. Please also follow instruction sheet, as given.

## 2014-05-14 NOTE — Progress Notes (Signed)
Joseph Roman Date of Birth: [birth year] Medical Record #[id number]  History of Present Illness: Joseph Roman is seen for yearly followup. He has a history of coronary disease and is status post inferior lateral myocardial infarction in June of 2009. He had stenting of the mid left circumflex coronary emergently with a 2.75 x 23 mm Promus stent. His last stress test in July of 2010 was normal. On followup today he is doing very well.  He denies any symptoms of chest pain or shortness of breath. He remains physically active and still goes to Cardiac Rehab 3 days a week. He is still working full time. He does note that his leg strength has declined and is concerned about statins causing this.  Current Outpatient Prescriptions on File Prior to Visit  Medication Sig Dispense Refill  . aspirin 81 MG tablet Take 81 mg by mouth daily.      . clopidogrel (PLAVIX) 75 MG tablet TAKE 1 TABLET (75 MG TOTAL) BY MOUTH DAILY.  90 tablet  3  . levothyroxine (SYNTHROID, LEVOTHROID) 112 MCG tablet Take 112 mcg by mouth daily.      . nitroGLYCERIN (NITROSTAT) 0.4 MG SL tablet Place 1 tablet (0.4 mg total) under the tongue every 5 (five) minutes as needed for chest pain.  25 tablet  11  . rosuvastatin (CRESTOR) 10 MG tablet TAKE 1 TABLET (10 MG TOTAL) BY MOUTH DAILY.  90 tablet  0  . vitamin B-12 (CYANOCOBALAMIN) 1000 MCG tablet Take 1,000 mcg by mouth daily.       No current facility-administered medications on file prior to visit.    Allergies  Allergen Reactions  . Other     Beta blockers-bradycardia    Past Medical History  Diagnosis Date  . Acute inferolateral myocardial infarction     04/2008 2.75x23 Promus Lcx  . Coronary artery disease   . Hypothyroidism   . Dyslipidemia     Mild dyslipidemia  . GERD (gastroesophageal reflux disease)     Past Surgical History  Procedure Laterality Date  . Cardiac catheterization  05/07/2008    Ejection fraction was estimated at 60%  . Colonoscopy   04/21/2005    with polypectomy  . Tonsillectomy    . Cataract      History  Smoking status  . Never Smoker   Smokeless tobacco  . Not on file    History  Alcohol Use     History reviewed. No pertinent family history.  Review of Systems: As noted in history of present illness.  All other systems were reviewed and are negative.  Physical Exam: BP 134/84  Pulse 53  Ht 5\' 10"  (1.778 m)  Wt 176 lb 1.9 oz (79.888 kg)  BMI 25.27 kg/m2 Is a pleasant, elderly white male in no acute distress.The patient is alert and oriented x 3. The skin is warm and dry.    The HEENT exam is normal.  The carotids are 2+ without bruits.  There is no thyromegaly.  There is no JVD.  The lungs are clear.    The heart exam reveals a regular rate with a normal S1 and S2.  There are no murmurs, gallops, or rubs.  The PMI is not displaced.   Abdominal exam reveals good bowel sounds.   There is no hepatosplenomegaly or tenderness.  There are no masses.  Exam of the legs reveal no clubbing, cyanosis, or edema.  The legs are without rashes.  The distal pulses are intact.  Cranial nerves  II - XII are intact.  Motor and sensory functions are intact.  The gait is normal.  LABORATORY DATA: ECG today shows sinus bradycardia with a rate of 49 beats per minute. It is otherwise normal.   Assessment / Plan: 1. Coronary disease with remote myocardial infarction status post stenting of the left circumflex coronary in 2009. He is asymptomatic. Continue antiplatelet therapy and statin. Will arrange follow up GXT since his last stress test was 5 years ago.   2. Dyslipidemia-well controlled on statin therapy. Recommend holding crestor for 3 weeks to see if his leg weakness improves.

## 2014-05-16 ENCOUNTER — Encounter (HOSPITAL_COMMUNITY)
Admission: RE | Admit: 2014-05-16 | Discharge: 2014-05-16 | Disposition: A | Discharge status: Home / Self Care | Payer: Self-pay | Source: Ambulatory Visit | Attending: Cardiology | Admitting: Cardiology

## 2014-05-18 ENCOUNTER — Encounter (HOSPITAL_COMMUNITY)
Admission: RE | Admit: 2014-05-18 | Discharge: 2014-05-18 | Disposition: A | Discharge status: Home / Self Care | Payer: Self-pay | Source: Ambulatory Visit | Attending: Cardiology | Admitting: Cardiology

## 2014-05-21 ENCOUNTER — Encounter (HOSPITAL_COMMUNITY)
Admission: RE | Admit: 2014-05-21 | Discharge: 2014-05-21 | Disposition: A | Discharge status: Home / Self Care | Payer: Self-pay | Source: Ambulatory Visit | Attending: Cardiology | Admitting: Cardiology

## 2014-05-23 ENCOUNTER — Encounter (HOSPITAL_COMMUNITY)
Admission: RE | Admit: 2014-05-23 | Discharge: 2014-05-23 | Disposition: A | Discharge status: Home / Self Care | Payer: Self-pay | Source: Ambulatory Visit | Attending: Cardiology | Admitting: Cardiology

## 2014-05-25 ENCOUNTER — Encounter (HOSPITAL_COMMUNITY)
Admission: RE | Admit: 2014-05-25 | Discharge: 2014-05-25 | Disposition: A | Discharge status: Home / Self Care | Payer: Self-pay | Source: Ambulatory Visit | Attending: Cardiology | Admitting: Cardiology

## 2014-05-28 ENCOUNTER — Encounter (HOSPITAL_COMMUNITY)
Admission: RE | Admit: 2014-05-28 | Discharge: 2014-05-28 | Disposition: A | Discharge status: Home / Self Care | Payer: Self-pay | Source: Ambulatory Visit | Attending: Cardiology | Admitting: Cardiology

## 2014-05-30 ENCOUNTER — Encounter (HOSPITAL_COMMUNITY)
Admission: RE | Admit: 2014-05-30 | Discharge: 2014-05-30 | Disposition: A | Discharge status: Home / Self Care | Payer: Self-pay | Source: Ambulatory Visit | Attending: Cardiology | Admitting: Cardiology

## 2014-05-30 DIAGNOSIS — Z9861 Coronary angioplasty status: Secondary | ICD-10-CM | POA: Insufficient documentation

## 2014-05-30 DIAGNOSIS — E785 Hyperlipidemia, unspecified: Secondary | ICD-10-CM | POA: Insufficient documentation

## 2014-05-30 DIAGNOSIS — I2510 Atherosclerotic heart disease of native coronary artery without angina pectoris: Secondary | ICD-10-CM | POA: Insufficient documentation

## 2014-05-30 DIAGNOSIS — I252 Old myocardial infarction: Secondary | ICD-10-CM | POA: Insufficient documentation

## 2014-05-30 DIAGNOSIS — Z5189 Encounter for other specified aftercare: Secondary | ICD-10-CM | POA: Insufficient documentation

## 2014-06-04 ENCOUNTER — Encounter (HOSPITAL_COMMUNITY)
Admission: RE | Admit: 2014-06-04 | Discharge: 2014-06-04 | Disposition: A | Discharge status: Home / Self Care | Payer: Medicare Other | Source: Ambulatory Visit | Attending: Cardiology | Admitting: Cardiology

## 2014-06-06 ENCOUNTER — Encounter (HOSPITAL_COMMUNITY)
Admission: RE | Admit: 2014-06-06 | Discharge: 2014-06-06 | Disposition: A | Discharge status: Home / Self Care | Payer: Self-pay | Source: Ambulatory Visit | Attending: Cardiology | Admitting: Cardiology

## 2014-06-07 DIAGNOSIS — H5703 Miosis: Secondary | ICD-10-CM | POA: Diagnosis not present

## 2014-06-07 DIAGNOSIS — H5200 Hypermetropia, unspecified eye: Secondary | ICD-10-CM | POA: Diagnosis not present

## 2014-06-07 DIAGNOSIS — Z961 Presence of intraocular lens: Secondary | ICD-10-CM | POA: Diagnosis not present

## 2014-06-07 DIAGNOSIS — H259 Unspecified age-related cataract: Secondary | ICD-10-CM | POA: Diagnosis not present

## 2014-06-08 ENCOUNTER — Encounter (HOSPITAL_COMMUNITY)
Admission: RE | Admit: 2014-06-08 | Discharge: 2014-06-08 | Disposition: A | Discharge status: Home / Self Care | Payer: Self-pay | Source: Ambulatory Visit | Attending: Cardiology | Admitting: Cardiology

## 2014-06-11 ENCOUNTER — Encounter (HOSPITAL_COMMUNITY)
Admission: RE | Admit: 2014-06-11 | Discharge: 2014-06-11 | Disposition: A | Discharge status: Home / Self Care | Payer: Self-pay | Source: Ambulatory Visit | Attending: Cardiology | Admitting: Cardiology

## 2014-06-13 ENCOUNTER — Encounter (HOSPITAL_COMMUNITY)
Admission: RE | Admit: 2014-06-13 | Discharge: 2014-06-13 | Disposition: A | Discharge status: Home / Self Care | Payer: Self-pay | Source: Ambulatory Visit | Attending: Cardiology | Admitting: Cardiology

## 2014-06-14 ENCOUNTER — Telehealth (HOSPITAL_COMMUNITY): Payer: Self-pay

## 2014-06-14 NOTE — Telephone Encounter (Signed)
Encounter complete. 

## 2014-06-15 ENCOUNTER — Encounter (HOSPITAL_COMMUNITY)
Admission: RE | Admit: 2014-06-15 | Discharge: 2014-06-15 | Disposition: A | Discharge status: Home / Self Care | Payer: Self-pay | Source: Ambulatory Visit | Attending: Cardiology | Admitting: Cardiology

## 2014-06-18 ENCOUNTER — Encounter (HOSPITAL_COMMUNITY)
Admission: RE | Admit: 2014-06-18 | Discharge: 2014-06-18 | Disposition: A | Discharge status: Home / Self Care | Payer: Self-pay | Source: Ambulatory Visit | Attending: Cardiology | Admitting: Cardiology

## 2014-06-19 ENCOUNTER — Ambulatory Visit (HOSPITAL_COMMUNITY)
Admission: RE | Admit: 2014-06-19 | Discharge: 2014-06-19 | Disposition: A | Discharge status: Home / Self Care | Payer: Medicare Other | Source: Ambulatory Visit | Attending: Cardiology | Admitting: Cardiology

## 2014-06-19 DIAGNOSIS — E785 Hyperlipidemia, unspecified: Secondary | ICD-10-CM | POA: Diagnosis not present

## 2014-06-19 DIAGNOSIS — I2510 Atherosclerotic heart disease of native coronary artery without angina pectoris: Secondary | ICD-10-CM | POA: Insufficient documentation

## 2014-06-19 DIAGNOSIS — I2584 Coronary atherosclerosis due to calcified coronary lesion: Secondary | ICD-10-CM

## 2014-06-19 NOTE — Procedures (Signed)
Exercise Treadmill Test  Test  Exercise Tolerance Test Ordering MD: Peter Martinique, MD    Unique Test No: 1   Treadmill:  1  Indication for ETT: weakness  Contraindication to ETT: No   Stress Modality: exercise - treadmill  Cardiac Imaging Performed: non   Protocol: standard Bruce - maximal  Max BP:  191/74  Max MPHR (bpm):  138 85% MPR (bpm):  117  MPHR obtained (bpm):  157 % MPHR obtained:  113  Reached 85% MPHR (min:sec):  7:15 Total Exercise Time (min-sec):  10  Workload in METS:  11.7 Borg Scale: 14  Reason ETT Terminated:  fatigue    ST Segment Analysis At Rest: normal ST segments - no evidence of significant ST depression With Exercise: 3-4 mm upsloping inferior and lateral ST depressions  Other Information Arrhythmia:  PVC's Angina during ETT:  absent (0) Quality of ETT:  diagnostic  ETT Interpretation:  normal - no evidence of ischemia by ST analysis  Comments: 3-4 mm upsloping ST depressions inferiorly and laterally Excellent exercise tolerance for age No chest pain Normal BP response to exercise Scattered PVC's Low risk for cardiac events  Pixie Casino, MD, [facility] Attending Cardiologist [place]

## 2014-06-20 ENCOUNTER — Encounter (HOSPITAL_COMMUNITY)
Admission: RE | Admit: 2014-06-20 | Discharge: 2014-06-20 | Disposition: A | Discharge status: Home / Self Care | Payer: Self-pay | Source: Ambulatory Visit | Attending: Cardiology | Admitting: Cardiology

## 2014-06-21 ENCOUNTER — Telehealth: Payer: Self-pay

## 2014-06-21 NOTE — Telephone Encounter (Signed)
Spoke to patient he stated he stopped Crestor for 3 weeks like Dr.Jordan advised due to cramping.Stated he restarted Crestor 10 mg daily 2 weeks ago and he is having cramping again.Advised to stop Crestor.Will send message to [place] for advice.

## 2014-06-22 ENCOUNTER — Encounter (HOSPITAL_COMMUNITY)
Admission: RE | Admit: 2014-06-22 | Discharge: 2014-06-22 | Disposition: A | Discharge status: Home / Self Care | Payer: Self-pay | Source: Ambulatory Visit | Attending: Cardiology | Admitting: Cardiology

## 2014-06-22 NOTE — Telephone Encounter (Signed)
Lets try pravastatin 10 mg daily and see if he can tolerate this better.  Peter Martinique MD, [facility]

## 2014-06-25 ENCOUNTER — Encounter (HOSPITAL_COMMUNITY)
Admission: RE | Admit: 2014-06-25 | Discharge: 2014-06-25 | Disposition: A | Discharge status: Home / Self Care | Payer: Self-pay | Source: Ambulatory Visit | Attending: Cardiology | Admitting: Cardiology

## 2014-06-25 NOTE — Telephone Encounter (Signed)
Returned call to patient no answer.LMTC. 

## 2014-06-26 ENCOUNTER — Telehealth: Payer: Self-pay | Admitting: Cardiology

## 2014-06-26 MED ORDER — PRAVASTATIN SODIUM 10 MG PO TABS: 10.0000 mg | ORAL_TABLET | Freq: Every day | ORAL | Status: DC

## 2014-06-26 NOTE — Telephone Encounter (Signed)
Forward to [place]

## 2014-06-26 NOTE — Telephone Encounter (Signed)
Returned call to patient Dr.Jordan advised to try pravastatin 10 mg daily.Advised to call back if he has any problems.Prescription sent to pharmacy.

## 2014-06-26 NOTE — Telephone Encounter (Signed)
Patient is returning a call to [place].

## 2014-06-27 ENCOUNTER — Encounter (HOSPITAL_COMMUNITY)
Admission: RE | Admit: 2014-06-27 | Discharge: 2014-06-27 | Disposition: A | Discharge status: Home / Self Care | Payer: Self-pay | Source: Ambulatory Visit | Attending: Cardiology | Admitting: Cardiology

## 2014-06-29 ENCOUNTER — Encounter (HOSPITAL_COMMUNITY)
Admission: RE | Admit: 2014-06-29 | Discharge: 2014-06-29 | Disposition: A | Discharge status: Home / Self Care | Payer: Self-pay | Source: Ambulatory Visit | Attending: Cardiology | Admitting: Cardiology

## 2014-06-29 NOTE — Telephone Encounter (Signed)
Spoke to patient 06/26/14.Dr.Jordan advised to take Pravastatin 10 mg daily.Advised to call back if he has any problems.Prescription sent to pharmacy.

## 2014-07-02 ENCOUNTER — Encounter (HOSPITAL_COMMUNITY)
Admission: RE | Admit: 2014-07-02 | Discharge: 2014-07-02 | Disposition: A | Discharge status: Home / Self Care | Payer: Self-pay | Source: Ambulatory Visit | Attending: Cardiology | Admitting: Cardiology

## 2014-07-02 DIAGNOSIS — E785 Hyperlipidemia, unspecified: Secondary | ICD-10-CM | POA: Insufficient documentation

## 2014-07-02 DIAGNOSIS — I2510 Atherosclerotic heart disease of native coronary artery without angina pectoris: Secondary | ICD-10-CM | POA: Insufficient documentation

## 2014-07-02 DIAGNOSIS — I252 Old myocardial infarction: Secondary | ICD-10-CM | POA: Insufficient documentation

## 2014-07-02 DIAGNOSIS — Z5189 Encounter for other specified aftercare: Secondary | ICD-10-CM | POA: Insufficient documentation

## 2014-07-02 DIAGNOSIS — Z9861 Coronary angioplasty status: Secondary | ICD-10-CM | POA: Insufficient documentation

## 2014-07-04 ENCOUNTER — Encounter (HOSPITAL_COMMUNITY)
Admission: RE | Admit: 2014-07-04 | Discharge: 2014-07-04 | Disposition: A | Discharge status: Home / Self Care | Payer: Self-pay | Source: Ambulatory Visit | Attending: Cardiology | Admitting: Cardiology

## 2014-07-06 ENCOUNTER — Encounter (HOSPITAL_COMMUNITY)
Admission: RE | Admit: 2014-07-06 | Discharge: 2014-07-06 | Disposition: A | Discharge status: Home / Self Care | Payer: Self-pay | Source: Ambulatory Visit | Attending: Cardiology | Admitting: Cardiology

## 2014-07-09 ENCOUNTER — Encounter (HOSPITAL_COMMUNITY)
Admission: RE | Admit: 2014-07-09 | Discharge: 2014-07-09 | Disposition: A | Discharge status: Home / Self Care | Payer: Self-pay | Source: Ambulatory Visit | Attending: Cardiology | Admitting: Cardiology

## 2014-07-11 ENCOUNTER — Encounter (HOSPITAL_COMMUNITY)
Admission: RE | Admit: 2014-07-11 | Discharge: 2014-07-11 | Disposition: A | Discharge status: Home / Self Care | Payer: Self-pay | Source: Ambulatory Visit | Attending: Cardiology | Admitting: Cardiology

## 2014-07-13 ENCOUNTER — Encounter (HOSPITAL_COMMUNITY)
Admission: RE | Admit: 2014-07-13 | Discharge: 2014-07-13 | Disposition: A | Discharge status: Home / Self Care | Payer: Self-pay | Source: Ambulatory Visit | Attending: Cardiology | Admitting: Cardiology

## 2014-07-16 ENCOUNTER — Encounter (HOSPITAL_COMMUNITY)
Admission: RE | Admit: 2014-07-16 | Discharge: 2014-07-16 | Disposition: A | Discharge status: Home / Self Care | Payer: Self-pay | Source: Ambulatory Visit | Attending: Cardiology | Admitting: Cardiology

## 2014-07-18 ENCOUNTER — Encounter (HOSPITAL_COMMUNITY)
Admission: RE | Admit: 2014-07-18 | Discharge: 2014-07-18 | Disposition: A | Discharge status: Home / Self Care | Payer: Self-pay | Source: Ambulatory Visit | Attending: Cardiology | Admitting: Cardiology

## 2014-07-20 ENCOUNTER — Encounter (HOSPITAL_COMMUNITY)
Admission: RE | Admit: 2014-07-20 | Discharge: 2014-07-20 | Disposition: A | Discharge status: Home / Self Care | Payer: Self-pay | Source: Ambulatory Visit | Attending: Cardiology | Admitting: Cardiology

## 2014-07-23 ENCOUNTER — Encounter (HOSPITAL_COMMUNITY)
Admission: RE | Admit: 2014-07-23 | Discharge: 2014-07-23 | Disposition: A | Discharge status: Home / Self Care | Payer: Self-pay | Source: Ambulatory Visit | Attending: Cardiology | Admitting: Cardiology

## 2014-07-25 ENCOUNTER — Encounter (HOSPITAL_COMMUNITY): Payer: Self-pay

## 2014-07-27 ENCOUNTER — Encounter (HOSPITAL_COMMUNITY)
Admission: RE | Admit: 2014-07-27 | Discharge: 2014-07-27 | Disposition: A | Discharge status: Home / Self Care | Payer: Self-pay | Source: Ambulatory Visit | Attending: Cardiology | Admitting: Cardiology

## 2014-07-30 ENCOUNTER — Encounter (HOSPITAL_COMMUNITY): Payer: Self-pay

## 2014-08-01 ENCOUNTER — Encounter (HOSPITAL_COMMUNITY): Payer: Medicare Other

## 2014-08-01 DIAGNOSIS — I2510 Atherosclerotic heart disease of native coronary artery without angina pectoris: Secondary | ICD-10-CM | POA: Insufficient documentation

## 2014-08-01 DIAGNOSIS — Z5189 Encounter for other specified aftercare: Secondary | ICD-10-CM | POA: Insufficient documentation

## 2014-08-01 DIAGNOSIS — Z9861 Coronary angioplasty status: Secondary | ICD-10-CM | POA: Insufficient documentation

## 2014-08-01 DIAGNOSIS — I252 Old myocardial infarction: Secondary | ICD-10-CM | POA: Insufficient documentation

## 2014-08-01 DIAGNOSIS — E785 Hyperlipidemia, unspecified: Secondary | ICD-10-CM | POA: Insufficient documentation

## 2014-08-03 ENCOUNTER — Encounter (HOSPITAL_COMMUNITY): Payer: Self-pay

## 2014-08-08 ENCOUNTER — Encounter (HOSPITAL_COMMUNITY)
Admission: RE | Admit: 2014-08-08 | Discharge: 2014-08-08 | Disposition: A | Discharge status: Home / Self Care | Payer: Self-pay | Source: Ambulatory Visit | Attending: Cardiology | Admitting: Cardiology

## 2014-08-10 ENCOUNTER — Encounter (HOSPITAL_COMMUNITY)
Admission: RE | Admit: 2014-08-10 | Discharge: 2014-08-10 | Disposition: A | Discharge status: Home / Self Care | Payer: Self-pay | Source: Ambulatory Visit | Attending: Cardiology | Admitting: Cardiology

## 2014-08-13 ENCOUNTER — Encounter (HOSPITAL_COMMUNITY): Payer: Self-pay

## 2014-08-15 ENCOUNTER — Encounter (HOSPITAL_COMMUNITY)
Admission: RE | Admit: 2014-08-15 | Discharge: 2014-08-15 | Disposition: A | Discharge status: Home / Self Care | Payer: Self-pay | Source: Ambulatory Visit | Attending: Cardiology | Admitting: Cardiology

## 2014-08-17 ENCOUNTER — Encounter (HOSPITAL_COMMUNITY)
Admission: RE | Admit: 2014-08-17 | Discharge: 2014-08-17 | Disposition: A | Discharge status: Home / Self Care | Payer: Self-pay | Source: Ambulatory Visit | Attending: Cardiology | Admitting: Cardiology

## 2014-08-20 ENCOUNTER — Encounter (HOSPITAL_COMMUNITY): Payer: Self-pay

## 2014-08-22 ENCOUNTER — Encounter (HOSPITAL_COMMUNITY)
Admission: RE | Admit: 2014-08-22 | Discharge: 2014-08-22 | Disposition: A | Discharge status: Home / Self Care | Payer: Self-pay | Source: Ambulatory Visit | Attending: Cardiology | Admitting: Cardiology

## 2014-08-24 ENCOUNTER — Encounter (HOSPITAL_COMMUNITY)
Admission: RE | Admit: 2014-08-24 | Discharge: 2014-08-24 | Disposition: A | Discharge status: Home / Self Care | Payer: Self-pay | Source: Ambulatory Visit | Attending: Cardiology | Admitting: Cardiology

## 2014-08-27 ENCOUNTER — Encounter (HOSPITAL_COMMUNITY): Payer: Self-pay

## 2014-08-29 ENCOUNTER — Encounter (HOSPITAL_COMMUNITY)
Admission: RE | Admit: 2014-08-29 | Discharge: 2014-08-29 | Disposition: A | Discharge status: Home / Self Care | Payer: Self-pay | Source: Ambulatory Visit | Attending: Cardiology | Admitting: Cardiology

## 2014-08-31 ENCOUNTER — Encounter (HOSPITAL_COMMUNITY)
Admission: RE | Admit: 2014-08-31 | Discharge: 2014-08-31 | Disposition: A | Discharge status: Home / Self Care | Payer: Self-pay | Source: Ambulatory Visit | Attending: Cardiology | Admitting: Cardiology

## 2014-08-31 DIAGNOSIS — Z5189 Encounter for other specified aftercare: Secondary | ICD-10-CM | POA: Insufficient documentation

## 2014-08-31 DIAGNOSIS — Z951 Presence of aortocoronary bypass graft: Secondary | ICD-10-CM | POA: Insufficient documentation

## 2014-08-31 DIAGNOSIS — I2510 Atherosclerotic heart disease of native coronary artery without angina pectoris: Secondary | ICD-10-CM | POA: Insufficient documentation

## 2014-08-31 DIAGNOSIS — I252 Old myocardial infarction: Secondary | ICD-10-CM | POA: Insufficient documentation

## 2014-08-31 DIAGNOSIS — I10 Essential (primary) hypertension: Secondary | ICD-10-CM | POA: Insufficient documentation

## 2014-09-03 ENCOUNTER — Encounter (HOSPITAL_COMMUNITY)
Admission: RE | Admit: 2014-09-03 | Discharge: 2014-09-03 | Disposition: A | Discharge status: Home / Self Care | Payer: Self-pay | Source: Ambulatory Visit | Attending: Cardiology | Admitting: Cardiology

## 2014-09-05 ENCOUNTER — Encounter (HOSPITAL_COMMUNITY)
Admission: RE | Admit: 2014-09-05 | Discharge: 2014-09-05 | Disposition: A | Discharge status: Home / Self Care | Payer: Self-pay | Source: Ambulatory Visit | Attending: Cardiology | Admitting: Cardiology

## 2014-09-07 ENCOUNTER — Encounter (HOSPITAL_COMMUNITY)
Admission: RE | Admit: 2014-09-07 | Discharge: 2014-09-07 | Disposition: A | Discharge status: Home / Self Care | Payer: Self-pay | Source: Ambulatory Visit | Attending: Cardiology | Admitting: Cardiology

## 2014-09-07 DIAGNOSIS — Z23 Encounter for immunization: Secondary | ICD-10-CM | POA: Diagnosis not present

## 2014-09-10 ENCOUNTER — Encounter (HOSPITAL_COMMUNITY)
Admission: RE | Admit: 2014-09-10 | Discharge: 2014-09-10 | Disposition: A | Discharge status: Home / Self Care | Payer: Self-pay | Source: Ambulatory Visit | Attending: Cardiology | Admitting: Cardiology

## 2014-09-12 ENCOUNTER — Encounter (HOSPITAL_COMMUNITY)
Admission: RE | Admit: 2014-09-12 | Discharge: 2014-09-12 | Disposition: A | Discharge status: Home / Self Care | Payer: Self-pay | Source: Ambulatory Visit | Attending: Cardiology | Admitting: Cardiology

## 2014-09-14 ENCOUNTER — Encounter (HOSPITAL_COMMUNITY)
Admission: RE | Admit: 2014-09-14 | Discharge: 2014-09-14 | Disposition: A | Discharge status: Home / Self Care | Payer: Self-pay | Source: Ambulatory Visit | Attending: Cardiology | Admitting: Cardiology

## 2014-09-14 ENCOUNTER — Other Ambulatory Visit: Payer: Self-pay | Admitting: Cardiology

## 2014-09-17 ENCOUNTER — Encounter (HOSPITAL_COMMUNITY)
Admission: RE | Admit: 2014-09-17 | Discharge: 2014-09-17 | Disposition: A | Discharge status: Home / Self Care | Payer: Self-pay | Source: Ambulatory Visit | Attending: Cardiology | Admitting: Cardiology

## 2014-09-19 ENCOUNTER — Encounter (HOSPITAL_COMMUNITY)
Admission: RE | Admit: 2014-09-19 | Discharge: 2014-09-19 | Disposition: A | Discharge status: Home / Self Care | Payer: Self-pay | Source: Ambulatory Visit | Attending: Cardiology | Admitting: Cardiology

## 2014-09-21 ENCOUNTER — Encounter (HOSPITAL_COMMUNITY)
Admission: RE | Admit: 2014-09-21 | Discharge: 2014-09-21 | Disposition: A | Discharge status: Home / Self Care | Payer: Self-pay | Source: Ambulatory Visit | Attending: Cardiology | Admitting: Cardiology

## 2014-09-24 ENCOUNTER — Encounter (HOSPITAL_COMMUNITY)
Admission: RE | Admit: 2014-09-24 | Discharge: 2014-09-24 | Disposition: A | Discharge status: Home / Self Care | Payer: Self-pay | Source: Ambulatory Visit | Attending: Cardiology | Admitting: Cardiology

## 2014-09-26 ENCOUNTER — Encounter (HOSPITAL_COMMUNITY): Payer: Self-pay

## 2014-09-28 ENCOUNTER — Encounter (HOSPITAL_COMMUNITY)
Admission: RE | Admit: 2014-09-28 | Discharge: 2014-09-28 | Disposition: A | Discharge status: Home / Self Care | Payer: Self-pay | Source: Ambulatory Visit | Attending: Cardiology | Admitting: Cardiology

## 2014-10-01 ENCOUNTER — Encounter (HOSPITAL_COMMUNITY)
Admission: RE | Admit: 2014-10-01 | Discharge: 2014-10-01 | Disposition: A | Discharge status: Home / Self Care | Payer: Self-pay | Source: Ambulatory Visit | Attending: Cardiology | Admitting: Cardiology

## 2014-10-01 DIAGNOSIS — Z5189 Encounter for other specified aftercare: Secondary | ICD-10-CM | POA: Insufficient documentation

## 2014-10-01 DIAGNOSIS — I252 Old myocardial infarction: Secondary | ICD-10-CM | POA: Insufficient documentation

## 2014-10-01 DIAGNOSIS — I2510 Atherosclerotic heart disease of native coronary artery without angina pectoris: Secondary | ICD-10-CM | POA: Insufficient documentation

## 2014-10-01 DIAGNOSIS — Z951 Presence of aortocoronary bypass graft: Secondary | ICD-10-CM | POA: Insufficient documentation

## 2014-10-01 DIAGNOSIS — I10 Essential (primary) hypertension: Secondary | ICD-10-CM | POA: Insufficient documentation

## 2014-10-03 ENCOUNTER — Encounter (HOSPITAL_COMMUNITY): Payer: Self-pay

## 2014-10-05 ENCOUNTER — Encounter (HOSPITAL_COMMUNITY): Payer: Self-pay

## 2014-10-08 ENCOUNTER — Encounter (HOSPITAL_COMMUNITY)
Admission: RE | Admit: 2014-10-08 | Discharge: 2014-10-08 | Disposition: A | Discharge status: Home / Self Care | Payer: Self-pay | Source: Ambulatory Visit | Attending: Cardiology | Admitting: Cardiology

## 2014-10-10 ENCOUNTER — Encounter (HOSPITAL_COMMUNITY)
Admission: RE | Admit: 2014-10-10 | Discharge: 2014-10-10 | Disposition: A | Discharge status: Home / Self Care | Payer: Self-pay | Source: Ambulatory Visit | Attending: Cardiology | Admitting: Cardiology

## 2014-10-12 ENCOUNTER — Encounter (HOSPITAL_COMMUNITY)
Admission: RE | Admit: 2014-10-12 | Discharge: 2014-10-12 | Disposition: A | Discharge status: Home / Self Care | Payer: Self-pay | Source: Ambulatory Visit | Attending: Cardiology | Admitting: Cardiology

## 2014-10-15 ENCOUNTER — Encounter (HOSPITAL_COMMUNITY)
Admission: RE | Admit: 2014-10-15 | Discharge: 2014-10-15 | Disposition: A | Discharge status: Home / Self Care | Payer: Self-pay | Source: Ambulatory Visit | Attending: Cardiology | Admitting: Cardiology

## 2014-10-17 ENCOUNTER — Encounter (HOSPITAL_COMMUNITY)
Admission: RE | Admit: 2014-10-17 | Discharge: 2014-10-17 | Disposition: A | Discharge status: Home / Self Care | Payer: Self-pay | Source: Ambulatory Visit | Attending: Cardiology | Admitting: Cardiology

## 2014-10-19 ENCOUNTER — Encounter (HOSPITAL_COMMUNITY): Payer: Self-pay

## 2014-10-22 ENCOUNTER — Encounter (HOSPITAL_COMMUNITY)
Admission: RE | Admit: 2014-10-22 | Discharge: 2014-10-22 | Disposition: A | Discharge status: Home / Self Care | Payer: Self-pay | Source: Ambulatory Visit | Attending: Cardiology | Admitting: Cardiology

## 2014-10-24 ENCOUNTER — Encounter (HOSPITAL_COMMUNITY)
Admission: RE | Admit: 2014-10-24 | Discharge: 2014-10-24 | Disposition: A | Discharge status: Home / Self Care | Payer: Self-pay | Source: Ambulatory Visit | Attending: Cardiology | Admitting: Cardiology

## 2014-10-29 ENCOUNTER — Encounter (HOSPITAL_COMMUNITY)
Admission: RE | Admit: 2014-10-29 | Discharge: 2014-10-29 | Disposition: A | Discharge status: Home / Self Care | Payer: Self-pay | Source: Ambulatory Visit | Attending: Cardiology | Admitting: Cardiology

## 2014-10-31 ENCOUNTER — Encounter (HOSPITAL_COMMUNITY)
Admission: RE | Admit: 2014-10-31 | Discharge: 2014-10-31 | Disposition: A | Discharge status: Home / Self Care | Payer: Self-pay | Source: Ambulatory Visit | Attending: Cardiology | Admitting: Cardiology

## 2014-10-31 DIAGNOSIS — I252 Old myocardial infarction: Secondary | ICD-10-CM | POA: Insufficient documentation

## 2014-10-31 DIAGNOSIS — I10 Essential (primary) hypertension: Secondary | ICD-10-CM | POA: Insufficient documentation

## 2014-10-31 DIAGNOSIS — Z5189 Encounter for other specified aftercare: Secondary | ICD-10-CM | POA: Insufficient documentation

## 2014-10-31 DIAGNOSIS — I2510 Atherosclerotic heart disease of native coronary artery without angina pectoris: Secondary | ICD-10-CM | POA: Insufficient documentation

## 2014-10-31 DIAGNOSIS — Z951 Presence of aortocoronary bypass graft: Secondary | ICD-10-CM | POA: Insufficient documentation

## 2014-11-02 ENCOUNTER — Encounter (HOSPITAL_COMMUNITY)
Admission: RE | Admit: 2014-11-02 | Discharge: 2014-11-02 | Disposition: A | Discharge status: Home / Self Care | Payer: Self-pay | Source: Ambulatory Visit | Attending: Cardiology | Admitting: Cardiology

## 2014-11-05 ENCOUNTER — Encounter (HOSPITAL_COMMUNITY)
Admission: RE | Admit: 2014-11-05 | Discharge: 2014-11-05 | Disposition: A | Discharge status: Home / Self Care | Payer: Self-pay | Source: Ambulatory Visit | Attending: Cardiology | Admitting: Cardiology

## 2014-11-07 ENCOUNTER — Encounter (HOSPITAL_COMMUNITY)
Admission: RE | Admit: 2014-11-07 | Discharge: 2014-11-07 | Disposition: A | Discharge status: Home / Self Care | Payer: Self-pay | Source: Ambulatory Visit | Attending: Cardiology | Admitting: Cardiology

## 2014-11-09 ENCOUNTER — Encounter (HOSPITAL_COMMUNITY): Payer: Self-pay

## 2014-11-12 ENCOUNTER — Encounter (HOSPITAL_COMMUNITY)
Admission: RE | Admit: 2014-11-12 | Discharge: 2014-11-12 | Disposition: A | Discharge status: Home / Self Care | Payer: Self-pay | Source: Ambulatory Visit | Attending: Cardiology | Admitting: Cardiology

## 2014-11-14 ENCOUNTER — Encounter (HOSPITAL_COMMUNITY)
Admission: RE | Admit: 2014-11-14 | Discharge: 2014-11-14 | Disposition: A | Discharge status: Home / Self Care | Payer: Self-pay | Source: Ambulatory Visit | Attending: Cardiology | Admitting: Cardiology

## 2014-11-16 ENCOUNTER — Encounter (HOSPITAL_COMMUNITY): Payer: Self-pay

## 2014-11-19 ENCOUNTER — Encounter (HOSPITAL_COMMUNITY): Payer: Self-pay

## 2014-11-20 DIAGNOSIS — L812 Freckles: Secondary | ICD-10-CM | POA: Diagnosis not present

## 2014-11-20 DIAGNOSIS — L570 Actinic keratosis: Secondary | ICD-10-CM | POA: Diagnosis not present

## 2014-11-20 DIAGNOSIS — L821 Other seborrheic keratosis: Secondary | ICD-10-CM | POA: Diagnosis not present

## 2014-11-21 ENCOUNTER — Encounter (HOSPITAL_COMMUNITY)
Admission: RE | Admit: 2014-11-21 | Discharge: 2014-11-21 | Disposition: A | Discharge status: Home / Self Care | Payer: Self-pay | Source: Ambulatory Visit | Attending: Cardiology | Admitting: Cardiology

## 2014-11-26 ENCOUNTER — Encounter (HOSPITAL_COMMUNITY)
Admission: RE | Admit: 2014-11-26 | Discharge: 2014-11-26 | Disposition: A | Discharge status: Home / Self Care | Payer: Self-pay | Source: Ambulatory Visit | Attending: Cardiology | Admitting: Cardiology

## 2014-11-28 ENCOUNTER — Encounter (HOSPITAL_COMMUNITY): Payer: Self-pay

## 2014-12-03 ENCOUNTER — Encounter (HOSPITAL_COMMUNITY)
Admission: RE | Admit: 2014-12-03 | Discharge: 2014-12-03 | Disposition: A | Discharge status: Home / Self Care | Payer: Self-pay | Source: Ambulatory Visit | Attending: Cardiology | Admitting: Cardiology

## 2014-12-03 DIAGNOSIS — I2510 Atherosclerotic heart disease of native coronary artery without angina pectoris: Secondary | ICD-10-CM | POA: Insufficient documentation

## 2014-12-03 DIAGNOSIS — I252 Old myocardial infarction: Secondary | ICD-10-CM | POA: Insufficient documentation

## 2014-12-03 DIAGNOSIS — Z951 Presence of aortocoronary bypass graft: Secondary | ICD-10-CM | POA: Insufficient documentation

## 2014-12-03 DIAGNOSIS — Z5189 Encounter for other specified aftercare: Secondary | ICD-10-CM | POA: Insufficient documentation

## 2014-12-03 DIAGNOSIS — I10 Essential (primary) hypertension: Secondary | ICD-10-CM | POA: Insufficient documentation

## 2014-12-05 ENCOUNTER — Encounter (HOSPITAL_COMMUNITY)
Admission: RE | Admit: 2014-12-05 | Discharge: 2014-12-05 | Disposition: A | Discharge status: Home / Self Care | Payer: Self-pay | Source: Ambulatory Visit | Attending: Cardiology | Admitting: Cardiology

## 2014-12-07 ENCOUNTER — Encounter (HOSPITAL_COMMUNITY): Payer: Self-pay

## 2014-12-10 ENCOUNTER — Encounter (HOSPITAL_COMMUNITY)
Admission: RE | Admit: 2014-12-10 | Discharge: 2014-12-10 | Disposition: A | Discharge status: Home / Self Care | Payer: Self-pay | Source: Ambulatory Visit | Attending: Cardiology | Admitting: Cardiology

## 2014-12-12 ENCOUNTER — Encounter (HOSPITAL_COMMUNITY)
Admission: RE | Admit: 2014-12-12 | Discharge: 2014-12-12 | Disposition: A | Discharge status: Home / Self Care | Payer: Self-pay | Source: Ambulatory Visit | Attending: Cardiology | Admitting: Cardiology

## 2014-12-12 ENCOUNTER — Other Ambulatory Visit: Payer: Self-pay | Admitting: Cardiology

## 2014-12-12 NOTE — Telephone Encounter (Signed)
Rx has been sent to the pharmacy electronically. ° °

## 2014-12-14 ENCOUNTER — Encounter (HOSPITAL_COMMUNITY): Payer: Self-pay

## 2014-12-17 ENCOUNTER — Other Ambulatory Visit: Payer: Self-pay | Admitting: Cardiology

## 2014-12-17 ENCOUNTER — Encounter (HOSPITAL_COMMUNITY): Payer: Self-pay

## 2014-12-18 ENCOUNTER — Other Ambulatory Visit: Payer: Self-pay

## 2014-12-19 ENCOUNTER — Encounter (HOSPITAL_COMMUNITY)
Admission: RE | Admit: 2014-12-19 | Discharge: 2014-12-19 | Disposition: A | Discharge status: Home / Self Care | Payer: Self-pay | Source: Ambulatory Visit | Attending: Cardiology | Admitting: Cardiology

## 2014-12-21 ENCOUNTER — Encounter (HOSPITAL_COMMUNITY): Payer: Self-pay

## 2014-12-24 ENCOUNTER — Encounter (HOSPITAL_COMMUNITY)
Admission: RE | Admit: 2014-12-24 | Discharge: 2014-12-24 | Disposition: A | Discharge status: Home / Self Care | Payer: Medicare Other | Source: Ambulatory Visit | Attending: Cardiology | Admitting: Cardiology

## 2014-12-26 ENCOUNTER — Encounter (HOSPITAL_COMMUNITY): Payer: Self-pay

## 2014-12-28 ENCOUNTER — Encounter (HOSPITAL_COMMUNITY)
Admission: RE | Admit: 2014-12-28 | Discharge: 2014-12-28 | Disposition: A | Discharge status: Home / Self Care | Payer: Self-pay | Source: Ambulatory Visit | Attending: Cardiology | Admitting: Cardiology

## 2014-12-31 ENCOUNTER — Encounter (HOSPITAL_COMMUNITY)
Admission: RE | Admit: 2014-12-31 | Discharge: 2014-12-31 | Disposition: A | Discharge status: Home / Self Care | Payer: Self-pay | Source: Ambulatory Visit | Attending: Cardiology | Admitting: Cardiology

## 2014-12-31 DIAGNOSIS — Z951 Presence of aortocoronary bypass graft: Secondary | ICD-10-CM | POA: Insufficient documentation

## 2014-12-31 DIAGNOSIS — I252 Old myocardial infarction: Secondary | ICD-10-CM | POA: Insufficient documentation

## 2014-12-31 DIAGNOSIS — I2510 Atherosclerotic heart disease of native coronary artery without angina pectoris: Secondary | ICD-10-CM | POA: Insufficient documentation

## 2014-12-31 DIAGNOSIS — Z5189 Encounter for other specified aftercare: Secondary | ICD-10-CM | POA: Insufficient documentation

## 2014-12-31 DIAGNOSIS — I10 Essential (primary) hypertension: Secondary | ICD-10-CM | POA: Insufficient documentation

## 2015-01-02 ENCOUNTER — Encounter (HOSPITAL_COMMUNITY)
Admission: RE | Admit: 2015-01-02 | Discharge: 2015-01-02 | Disposition: A | Discharge status: Home / Self Care | Payer: Self-pay | Source: Ambulatory Visit | Attending: Cardiology | Admitting: Cardiology

## 2015-01-04 ENCOUNTER — Encounter (HOSPITAL_COMMUNITY)
Admission: RE | Admit: 2015-01-04 | Discharge: 2015-01-04 | Disposition: A | Discharge status: Home / Self Care | Payer: Self-pay | Source: Ambulatory Visit | Attending: Cardiology | Admitting: Cardiology

## 2015-01-07 ENCOUNTER — Encounter (HOSPITAL_COMMUNITY): Payer: Self-pay

## 2015-01-09 ENCOUNTER — Encounter (HOSPITAL_COMMUNITY)
Admission: RE | Admit: 2015-01-09 | Discharge: 2015-01-09 | Disposition: A | Discharge status: Home / Self Care | Payer: Self-pay | Source: Ambulatory Visit | Attending: Cardiology | Admitting: Cardiology

## 2015-01-11 ENCOUNTER — Other Ambulatory Visit: Payer: Self-pay | Admitting: Cardiology

## 2015-01-11 ENCOUNTER — Encounter (HOSPITAL_COMMUNITY)
Admission: RE | Admit: 2015-01-11 | Discharge: 2015-01-11 | Disposition: A | Discharge status: Home / Self Care | Payer: Self-pay | Source: Ambulatory Visit | Attending: Cardiology | Admitting: Cardiology

## 2015-01-11 NOTE — Telephone Encounter (Signed)
Pravastatin refilled #30 with 5 refills 12/18/14

## 2015-01-14 ENCOUNTER — Encounter (HOSPITAL_COMMUNITY)
Admission: RE | Admit: 2015-01-14 | Discharge: 2015-01-14 | Disposition: A | Discharge status: Home / Self Care | Payer: Self-pay | Source: Ambulatory Visit | Attending: Cardiology | Admitting: Cardiology

## 2015-01-16 ENCOUNTER — Encounter (HOSPITAL_COMMUNITY)
Admission: RE | Admit: 2015-01-16 | Discharge: 2015-01-16 | Disposition: A | Discharge status: Home / Self Care | Payer: Self-pay | Source: Ambulatory Visit | Attending: Cardiology | Admitting: Cardiology

## 2015-01-18 ENCOUNTER — Encounter (HOSPITAL_COMMUNITY)
Admission: RE | Admit: 2015-01-18 | Discharge: 2015-01-18 | Disposition: A | Discharge status: Home / Self Care | Payer: Self-pay | Source: Ambulatory Visit | Attending: Cardiology | Admitting: Cardiology

## 2015-01-21 ENCOUNTER — Encounter (HOSPITAL_COMMUNITY): Payer: Self-pay

## 2015-01-23 ENCOUNTER — Encounter (HOSPITAL_COMMUNITY)
Admission: RE | Admit: 2015-01-23 | Discharge: 2015-01-23 | Disposition: A | Discharge status: Home / Self Care | Payer: Self-pay | Source: Ambulatory Visit | Attending: Cardiology | Admitting: Cardiology

## 2015-01-25 ENCOUNTER — Encounter (HOSPITAL_COMMUNITY)
Admission: RE | Admit: 2015-01-25 | Discharge: 2015-01-25 | Disposition: A | Discharge status: Home / Self Care | Payer: Self-pay | Source: Ambulatory Visit | Attending: Cardiology | Admitting: Cardiology

## 2015-01-28 ENCOUNTER — Encounter (HOSPITAL_COMMUNITY): Payer: Self-pay

## 2015-01-30 ENCOUNTER — Encounter (HOSPITAL_COMMUNITY): Payer: PRIVATE HEALTH INSURANCE

## 2015-01-30 DIAGNOSIS — R509 Fever, unspecified: Secondary | ICD-10-CM | POA: Diagnosis not present

## 2015-01-30 DIAGNOSIS — I252 Old myocardial infarction: Secondary | ICD-10-CM | POA: Insufficient documentation

## 2015-01-30 DIAGNOSIS — I10 Essential (primary) hypertension: Secondary | ICD-10-CM | POA: Insufficient documentation

## 2015-01-30 DIAGNOSIS — R05 Cough: Secondary | ICD-10-CM | POA: Diagnosis not present

## 2015-01-30 DIAGNOSIS — Z6825 Body mass index (BMI) 25.0-25.9, adult: Secondary | ICD-10-CM | POA: Diagnosis not present

## 2015-01-30 DIAGNOSIS — Z5189 Encounter for other specified aftercare: Secondary | ICD-10-CM | POA: Insufficient documentation

## 2015-01-30 DIAGNOSIS — J111 Influenza due to unidentified influenza virus with other respiratory manifestations: Secondary | ICD-10-CM | POA: Diagnosis not present

## 2015-01-30 DIAGNOSIS — I2510 Atherosclerotic heart disease of native coronary artery without angina pectoris: Secondary | ICD-10-CM | POA: Insufficient documentation

## 2015-01-30 DIAGNOSIS — Z951 Presence of aortocoronary bypass graft: Secondary | ICD-10-CM | POA: Insufficient documentation

## 2015-02-01 ENCOUNTER — Encounter (HOSPITAL_COMMUNITY): Payer: Self-pay

## 2015-02-04 ENCOUNTER — Encounter (HOSPITAL_COMMUNITY): Payer: Self-pay

## 2015-02-06 ENCOUNTER — Encounter (HOSPITAL_COMMUNITY)
Admission: RE | Admit: 2015-02-06 | Discharge: 2015-02-06 | Disposition: A | Discharge status: Home / Self Care | Payer: Self-pay | Source: Ambulatory Visit | Attending: Cardiology | Admitting: Cardiology

## 2015-02-08 ENCOUNTER — Encounter (HOSPITAL_COMMUNITY)
Admission: RE | Admit: 2015-02-08 | Discharge: 2015-02-08 | Disposition: A | Discharge status: Home / Self Care | Payer: Self-pay | Source: Ambulatory Visit | Attending: Cardiology | Admitting: Cardiology

## 2015-02-11 ENCOUNTER — Encounter (HOSPITAL_COMMUNITY)
Admission: RE | Admit: 2015-02-11 | Discharge: 2015-02-11 | Disposition: A | Discharge status: Home / Self Care | Payer: Self-pay | Source: Ambulatory Visit | Attending: Cardiology | Admitting: Cardiology

## 2015-02-13 ENCOUNTER — Encounter (HOSPITAL_COMMUNITY)
Admission: RE | Admit: 2015-02-13 | Discharge: 2015-02-13 | Disposition: A | Discharge status: Home / Self Care | Payer: Self-pay | Source: Ambulatory Visit | Attending: Cardiology | Admitting: Cardiology

## 2015-02-15 ENCOUNTER — Encounter (HOSPITAL_COMMUNITY)
Admission: RE | Admit: 2015-02-15 | Discharge: 2015-02-15 | Disposition: A | Discharge status: Home / Self Care | Payer: Self-pay | Source: Ambulatory Visit | Attending: Cardiology | Admitting: Cardiology

## 2015-02-18 ENCOUNTER — Encounter (HOSPITAL_COMMUNITY): Payer: Self-pay

## 2015-02-20 ENCOUNTER — Encounter (HOSPITAL_COMMUNITY)
Admission: RE | Admit: 2015-02-20 | Discharge: 2015-02-20 | Disposition: A | Discharge status: Home / Self Care | Payer: Self-pay | Source: Ambulatory Visit | Attending: Cardiology | Admitting: Cardiology

## 2015-02-22 ENCOUNTER — Encounter (HOSPITAL_COMMUNITY)
Admission: RE | Admit: 2015-02-22 | Discharge: 2015-02-22 | Disposition: A | Discharge status: Home / Self Care | Payer: Self-pay | Source: Ambulatory Visit | Attending: Cardiology | Admitting: Cardiology

## 2015-02-25 ENCOUNTER — Encounter (HOSPITAL_COMMUNITY)
Admission: RE | Admit: 2015-02-25 | Discharge: 2015-02-25 | Disposition: A | Discharge status: Home / Self Care | Payer: Self-pay | Source: Ambulatory Visit | Attending: Cardiology | Admitting: Cardiology

## 2015-02-27 ENCOUNTER — Encounter (HOSPITAL_COMMUNITY)
Admission: RE | Admit: 2015-02-27 | Discharge: 2015-02-27 | Disposition: A | Discharge status: Home / Self Care | Payer: Self-pay | Source: Ambulatory Visit | Attending: Cardiology | Admitting: Cardiology

## 2015-03-01 ENCOUNTER — Encounter (HOSPITAL_COMMUNITY): Payer: PRIVATE HEALTH INSURANCE

## 2015-03-01 DIAGNOSIS — Z5189 Encounter for other specified aftercare: Secondary | ICD-10-CM | POA: Insufficient documentation

## 2015-03-01 DIAGNOSIS — I252 Old myocardial infarction: Secondary | ICD-10-CM | POA: Insufficient documentation

## 2015-03-01 DIAGNOSIS — Z951 Presence of aortocoronary bypass graft: Secondary | ICD-10-CM | POA: Insufficient documentation

## 2015-03-01 DIAGNOSIS — I10 Essential (primary) hypertension: Secondary | ICD-10-CM | POA: Insufficient documentation

## 2015-03-01 DIAGNOSIS — I2510 Atherosclerotic heart disease of native coronary artery without angina pectoris: Secondary | ICD-10-CM | POA: Insufficient documentation

## 2015-03-04 ENCOUNTER — Encounter (HOSPITAL_COMMUNITY): Payer: Self-pay

## 2015-03-06 ENCOUNTER — Encounter (HOSPITAL_COMMUNITY)
Admission: RE | Admit: 2015-03-06 | Discharge: 2015-03-06 | Disposition: A | Discharge status: Home / Self Care | Payer: Self-pay | Source: Ambulatory Visit | Attending: Cardiology | Admitting: Cardiology

## 2015-03-08 ENCOUNTER — Encounter (HOSPITAL_COMMUNITY)
Admission: RE | Admit: 2015-03-08 | Discharge: 2015-03-08 | Disposition: A | Discharge status: Home / Self Care | Payer: Self-pay | Source: Ambulatory Visit | Attending: Cardiology | Admitting: Cardiology

## 2015-03-11 ENCOUNTER — Encounter (HOSPITAL_COMMUNITY)
Admission: RE | Admit: 2015-03-11 | Discharge: 2015-03-11 | Disposition: A | Discharge status: Home / Self Care | Payer: Self-pay | Source: Ambulatory Visit | Attending: Cardiology | Admitting: Cardiology

## 2015-03-13 ENCOUNTER — Encounter (HOSPITAL_COMMUNITY)
Admission: RE | Admit: 2015-03-13 | Discharge: 2015-03-13 | Disposition: A | Discharge status: Home / Self Care | Payer: Self-pay | Source: Ambulatory Visit | Attending: Cardiology | Admitting: Cardiology

## 2015-03-15 ENCOUNTER — Encounter (HOSPITAL_COMMUNITY)
Admission: RE | Admit: 2015-03-15 | Discharge: 2015-03-15 | Disposition: A | Discharge status: Home / Self Care | Payer: Self-pay | Source: Ambulatory Visit | Attending: Cardiology | Admitting: Cardiology

## 2015-03-15 DIAGNOSIS — S20229A Contusion of unspecified back wall of thorax, initial encounter: Secondary | ICD-10-CM | POA: Diagnosis not present

## 2015-03-15 DIAGNOSIS — R0781 Pleurodynia: Secondary | ICD-10-CM | POA: Diagnosis not present

## 2015-03-15 DIAGNOSIS — Z6825 Body mass index (BMI) 25.0-25.9, adult: Secondary | ICD-10-CM | POA: Diagnosis not present

## 2015-03-18 ENCOUNTER — Encounter (HOSPITAL_COMMUNITY)
Admission: RE | Admit: 2015-03-18 | Discharge: 2015-03-18 | Disposition: A | Discharge status: Home / Self Care | Payer: Self-pay | Source: Ambulatory Visit | Attending: Cardiology | Admitting: Cardiology

## 2015-03-20 ENCOUNTER — Encounter (HOSPITAL_COMMUNITY)
Admission: RE | Admit: 2015-03-20 | Discharge: 2015-03-20 | Disposition: A | Discharge status: Home / Self Care | Payer: Self-pay | Source: Ambulatory Visit | Attending: Cardiology | Admitting: Cardiology

## 2015-03-22 ENCOUNTER — Encounter (HOSPITAL_COMMUNITY)
Admission: RE | Admit: 2015-03-22 | Discharge: 2015-03-22 | Disposition: A | Discharge status: Home / Self Care | Payer: Self-pay | Source: Ambulatory Visit | Attending: Cardiology | Admitting: Cardiology

## 2015-03-25 ENCOUNTER — Encounter (HOSPITAL_COMMUNITY)
Admission: RE | Admit: 2015-03-25 | Discharge: 2015-03-25 | Disposition: A | Discharge status: Home / Self Care | Payer: Self-pay | Source: Ambulatory Visit | Attending: Cardiology | Admitting: Cardiology

## 2015-03-27 ENCOUNTER — Encounter (HOSPITAL_COMMUNITY)
Admission: RE | Admit: 2015-03-27 | Discharge: 2015-03-27 | Disposition: A | Discharge status: Home / Self Care | Payer: Self-pay | Source: Ambulatory Visit | Attending: Cardiology | Admitting: Cardiology

## 2015-03-29 ENCOUNTER — Encounter (HOSPITAL_COMMUNITY)
Admission: RE | Admit: 2015-03-29 | Discharge: 2015-03-29 | Disposition: A | Discharge status: Home / Self Care | Payer: Self-pay | Source: Ambulatory Visit | Attending: Cardiology | Admitting: Cardiology

## 2015-04-01 ENCOUNTER — Encounter (HOSPITAL_COMMUNITY)
Admission: RE | Admit: 2015-04-01 | Discharge: 2015-04-01 | Disposition: A | Discharge status: Home / Self Care | Payer: Self-pay | Source: Ambulatory Visit | Attending: Cardiology | Admitting: Cardiology

## 2015-04-01 DIAGNOSIS — I10 Essential (primary) hypertension: Secondary | ICD-10-CM | POA: Insufficient documentation

## 2015-04-01 DIAGNOSIS — I2510 Atherosclerotic heart disease of native coronary artery without angina pectoris: Secondary | ICD-10-CM | POA: Insufficient documentation

## 2015-04-01 DIAGNOSIS — I252 Old myocardial infarction: Secondary | ICD-10-CM | POA: Insufficient documentation

## 2015-04-01 DIAGNOSIS — Z5189 Encounter for other specified aftercare: Secondary | ICD-10-CM | POA: Insufficient documentation

## 2015-04-01 DIAGNOSIS — Z951 Presence of aortocoronary bypass graft: Secondary | ICD-10-CM | POA: Insufficient documentation

## 2015-04-03 ENCOUNTER — Encounter (HOSPITAL_COMMUNITY)
Admission: RE | Admit: 2015-04-03 | Discharge: 2015-04-03 | Disposition: A | Discharge status: Home / Self Care | Payer: Self-pay | Source: Ambulatory Visit | Attending: Cardiology | Admitting: Cardiology

## 2015-04-10 ENCOUNTER — Encounter (HOSPITAL_COMMUNITY)
Admission: RE | Admit: 2015-04-10 | Discharge: 2015-04-10 | Disposition: A | Discharge status: Home / Self Care | Payer: Self-pay | Source: Ambulatory Visit | Attending: Cardiology | Admitting: Cardiology

## 2015-04-12 ENCOUNTER — Encounter (HOSPITAL_COMMUNITY)
Admission: RE | Admit: 2015-04-12 | Discharge: 2015-04-12 | Disposition: A | Discharge status: Home / Self Care | Payer: Self-pay | Source: Ambulatory Visit | Attending: Cardiology | Admitting: Cardiology

## 2015-04-12 DIAGNOSIS — I2510 Atherosclerotic heart disease of native coronary artery without angina pectoris: Secondary | ICD-10-CM | POA: Diagnosis not present

## 2015-04-12 DIAGNOSIS — Z0000 Encounter for general adult medical examination without abnormal findings: Secondary | ICD-10-CM | POA: Diagnosis not present

## 2015-04-12 DIAGNOSIS — Z125 Encounter for screening for malignant neoplasm of prostate: Secondary | ICD-10-CM | POA: Diagnosis not present

## 2015-04-12 DIAGNOSIS — E039 Hypothyroidism, unspecified: Secondary | ICD-10-CM | POA: Diagnosis not present

## 2015-04-12 DIAGNOSIS — E785 Hyperlipidemia, unspecified: Secondary | ICD-10-CM | POA: Diagnosis not present

## 2015-04-12 DIAGNOSIS — E538 Deficiency of other specified B group vitamins: Secondary | ICD-10-CM | POA: Diagnosis not present

## 2015-04-15 ENCOUNTER — Encounter (HOSPITAL_COMMUNITY): Payer: Self-pay

## 2015-04-17 ENCOUNTER — Encounter (HOSPITAL_COMMUNITY): Payer: Self-pay

## 2015-04-19 ENCOUNTER — Encounter (HOSPITAL_COMMUNITY): Payer: Self-pay

## 2015-04-22 ENCOUNTER — Encounter (HOSPITAL_COMMUNITY)
Admission: RE | Admit: 2015-04-22 | Discharge: 2015-04-22 | Disposition: A | Discharge status: Home / Self Care | Payer: Self-pay | Source: Ambulatory Visit | Attending: Cardiology | Admitting: Cardiology

## 2015-04-22 DIAGNOSIS — E785 Hyperlipidemia, unspecified: Secondary | ICD-10-CM | POA: Diagnosis not present

## 2015-04-22 DIAGNOSIS — R030 Elevated blood-pressure reading, without diagnosis of hypertension: Secondary | ICD-10-CM | POA: Diagnosis not present

## 2015-04-22 DIAGNOSIS — D692 Other nonthrombocytopenic purpura: Secondary | ICD-10-CM | POA: Insufficient documentation

## 2015-04-22 DIAGNOSIS — I252 Old myocardial infarction: Secondary | ICD-10-CM | POA: Diagnosis not present

## 2015-04-22 DIAGNOSIS — I2510 Atherosclerotic heart disease of native coronary artery without angina pectoris: Secondary | ICD-10-CM | POA: Diagnosis not present

## 2015-04-22 DIAGNOSIS — N400 Enlarged prostate without lower urinary tract symptoms: Secondary | ICD-10-CM | POA: Diagnosis not present

## 2015-04-22 DIAGNOSIS — D539 Nutritional anemia, unspecified: Secondary | ICD-10-CM | POA: Diagnosis not present

## 2015-04-22 DIAGNOSIS — Z0000 Encounter for general adult medical examination without abnormal findings: Secondary | ICD-10-CM | POA: Diagnosis not present

## 2015-04-22 DIAGNOSIS — M189 Osteoarthritis of first carpometacarpal joint, unspecified: Secondary | ICD-10-CM | POA: Diagnosis not present

## 2015-04-22 DIAGNOSIS — E538 Deficiency of other specified B group vitamins: Secondary | ICD-10-CM | POA: Diagnosis not present

## 2015-04-22 DIAGNOSIS — Z1389 Encounter for screening for other disorder: Secondary | ICD-10-CM | POA: Diagnosis not present

## 2015-04-22 DIAGNOSIS — Z6825 Body mass index (BMI) 25.0-25.9, adult: Secondary | ICD-10-CM | POA: Diagnosis not present

## 2015-04-24 ENCOUNTER — Encounter (HOSPITAL_COMMUNITY)
Admission: RE | Admit: 2015-04-24 | Discharge: 2015-04-24 | Disposition: A | Discharge status: Home / Self Care | Payer: Self-pay | Source: Ambulatory Visit | Attending: Cardiology | Admitting: Cardiology

## 2015-04-26 ENCOUNTER — Encounter (HOSPITAL_COMMUNITY)
Admission: RE | Admit: 2015-04-26 | Discharge: 2015-04-26 | Disposition: A | Discharge status: Home / Self Care | Payer: Self-pay | Source: Ambulatory Visit | Attending: Cardiology | Admitting: Cardiology

## 2015-05-01 ENCOUNTER — Encounter (HOSPITAL_COMMUNITY): Payer: PRIVATE HEALTH INSURANCE

## 2015-05-01 DIAGNOSIS — Z5189 Encounter for other specified aftercare: Secondary | ICD-10-CM | POA: Insufficient documentation

## 2015-05-01 DIAGNOSIS — I2510 Atherosclerotic heart disease of native coronary artery without angina pectoris: Secondary | ICD-10-CM | POA: Insufficient documentation

## 2015-05-01 DIAGNOSIS — I252 Old myocardial infarction: Secondary | ICD-10-CM | POA: Insufficient documentation

## 2015-05-01 DIAGNOSIS — I10 Essential (primary) hypertension: Secondary | ICD-10-CM | POA: Insufficient documentation

## 2015-05-01 DIAGNOSIS — Z951 Presence of aortocoronary bypass graft: Secondary | ICD-10-CM | POA: Insufficient documentation

## 2015-05-03 ENCOUNTER — Encounter (HOSPITAL_COMMUNITY)
Admission: RE | Admit: 2015-05-03 | Discharge: 2015-05-03 | Disposition: A | Discharge status: Home / Self Care | Payer: Self-pay | Source: Ambulatory Visit | Attending: Cardiology | Admitting: Cardiology

## 2015-05-06 ENCOUNTER — Encounter (HOSPITAL_COMMUNITY)
Admission: RE | Admit: 2015-05-06 | Discharge: 2015-05-06 | Disposition: A | Discharge status: Home / Self Care | Payer: Self-pay | Source: Ambulatory Visit | Attending: Cardiology | Admitting: Cardiology

## 2015-05-08 ENCOUNTER — Encounter (HOSPITAL_COMMUNITY)
Admission: RE | Admit: 2015-05-08 | Discharge: 2015-05-08 | Disposition: A | Discharge status: Home / Self Care | Payer: Self-pay | Source: Ambulatory Visit | Attending: Cardiology | Admitting: Cardiology

## 2015-05-10 ENCOUNTER — Encounter (HOSPITAL_COMMUNITY): Payer: Self-pay

## 2015-05-13 ENCOUNTER — Encounter (HOSPITAL_COMMUNITY)
Admission: RE | Admit: 2015-05-13 | Discharge: 2015-05-13 | Disposition: A | Discharge status: Home / Self Care | Payer: Self-pay | Source: Ambulatory Visit | Attending: Cardiology | Admitting: Cardiology

## 2015-05-15 ENCOUNTER — Encounter (HOSPITAL_COMMUNITY)
Admission: RE | Admit: 2015-05-15 | Discharge: 2015-05-15 | Disposition: A | Discharge status: Home / Self Care | Payer: Self-pay | Source: Ambulatory Visit | Attending: Cardiology | Admitting: Cardiology

## 2015-05-17 ENCOUNTER — Encounter (HOSPITAL_COMMUNITY)
Admission: RE | Admit: 2015-05-17 | Discharge: 2015-05-17 | Disposition: A | Discharge status: Home / Self Care | Payer: Self-pay | Source: Ambulatory Visit | Attending: Cardiology | Admitting: Cardiology

## 2015-05-20 ENCOUNTER — Encounter (HOSPITAL_COMMUNITY)
Admission: RE | Admit: 2015-05-20 | Discharge: 2015-05-20 | Disposition: A | Discharge status: Home / Self Care | Payer: Self-pay | Source: Ambulatory Visit | Attending: Cardiology | Admitting: Cardiology

## 2015-05-22 ENCOUNTER — Encounter (HOSPITAL_COMMUNITY): Payer: Self-pay

## 2015-05-24 ENCOUNTER — Encounter (HOSPITAL_COMMUNITY)
Admission: RE | Admit: 2015-05-24 | Discharge: 2015-05-24 | Disposition: A | Discharge status: Home / Self Care | Payer: Self-pay | Source: Ambulatory Visit | Attending: Cardiology | Admitting: Cardiology

## 2015-05-27 ENCOUNTER — Encounter (HOSPITAL_COMMUNITY)
Admission: RE | Admit: 2015-05-27 | Discharge: 2015-05-27 | Disposition: A | Discharge status: Home / Self Care | Payer: Self-pay | Source: Ambulatory Visit | Attending: Cardiology | Admitting: Cardiology

## 2015-05-29 ENCOUNTER — Encounter (HOSPITAL_COMMUNITY): Payer: Self-pay

## 2015-05-29 DIAGNOSIS — H25812 Combined forms of age-related cataract, left eye: Secondary | ICD-10-CM | POA: Diagnosis not present

## 2015-05-29 DIAGNOSIS — H52203 Unspecified astigmatism, bilateral: Secondary | ICD-10-CM | POA: Diagnosis not present

## 2015-05-29 DIAGNOSIS — H43813 Vitreous degeneration, bilateral: Secondary | ICD-10-CM | POA: Diagnosis not present

## 2015-05-29 DIAGNOSIS — Z961 Presence of intraocular lens: Secondary | ICD-10-CM | POA: Diagnosis not present

## 2015-05-30 ENCOUNTER — Telehealth: Payer: Self-pay

## 2015-05-30 NOTE — Telephone Encounter (Signed)
Received a call from patient.He stated he is scheduled for left cataract surgery 06/25/15 wants to know if ok to hold plavix 5 days prior to surgery.Message sent to [place] for advice.

## 2015-05-31 ENCOUNTER — Encounter (HOSPITAL_COMMUNITY)
Admission: RE | Admit: 2015-05-31 | Discharge: 2015-05-31 | Disposition: A | Discharge status: Home / Self Care | Payer: Medicare Other | Source: Ambulatory Visit | Attending: Cardiology | Admitting: Cardiology

## 2015-05-31 DIAGNOSIS — I10 Essential (primary) hypertension: Secondary | ICD-10-CM | POA: Diagnosis not present

## 2015-05-31 DIAGNOSIS — Z5189 Encounter for other specified aftercare: Secondary | ICD-10-CM | POA: Insufficient documentation

## 2015-05-31 DIAGNOSIS — I2510 Atherosclerotic heart disease of native coronary artery without angina pectoris: Secondary | ICD-10-CM | POA: Insufficient documentation

## 2015-05-31 DIAGNOSIS — I252 Old myocardial infarction: Secondary | ICD-10-CM | POA: Diagnosis not present

## 2015-05-31 DIAGNOSIS — Z951 Presence of aortocoronary bypass graft: Secondary | ICD-10-CM | POA: Diagnosis not present

## 2015-05-31 NOTE — Telephone Encounter (Signed)
Returned call to patient no answer.Left message on personal voice mail Dr.Jordan advised does not need to hold plavix for cataract surgery.

## 2015-05-31 NOTE — Telephone Encounter (Signed)
He does not need to stop Plavix for cataract surgery.   Ananya Mccleese Martinique MD, [facility]

## 2015-06-05 ENCOUNTER — Encounter (HOSPITAL_COMMUNITY)
Admission: RE | Admit: 2015-06-05 | Discharge: 2015-06-05 | Disposition: A | Discharge status: Home / Self Care | Payer: Medicare Other | Source: Ambulatory Visit | Attending: Cardiology | Admitting: Cardiology

## 2015-06-07 ENCOUNTER — Other Ambulatory Visit: Payer: Self-pay | Admitting: Cardiology

## 2015-06-07 ENCOUNTER — Encounter (HOSPITAL_COMMUNITY)
Admission: RE | Admit: 2015-06-07 | Discharge: 2015-06-07 | Disposition: A | Discharge status: Home / Self Care | Payer: Medicare Other | Source: Ambulatory Visit | Attending: Cardiology | Admitting: Cardiology

## 2015-06-07 DIAGNOSIS — L570 Actinic keratosis: Secondary | ICD-10-CM | POA: Diagnosis not present

## 2015-06-07 DIAGNOSIS — L821 Other seborrheic keratosis: Secondary | ICD-10-CM | POA: Diagnosis not present

## 2015-06-07 DIAGNOSIS — D1801 Hemangioma of skin and subcutaneous tissue: Secondary | ICD-10-CM | POA: Diagnosis not present

## 2015-06-07 DIAGNOSIS — D225 Melanocytic nevi of trunk: Secondary | ICD-10-CM | POA: Diagnosis not present

## 2015-06-07 NOTE — Telephone Encounter (Signed)
Rx(s) sent to pharmacy electronically.  

## 2015-06-10 ENCOUNTER — Encounter (HOSPITAL_COMMUNITY)
Admission: RE | Admit: 2015-06-10 | Discharge: 2015-06-10 | Disposition: A | Discharge status: Home / Self Care | Payer: Medicare Other | Source: Ambulatory Visit | Attending: Cardiology | Admitting: Cardiology

## 2015-06-12 ENCOUNTER — Encounter (HOSPITAL_COMMUNITY)
Admission: RE | Admit: 2015-06-12 | Discharge: 2015-06-12 | Disposition: A | Discharge status: Home / Self Care | Payer: Medicare Other | Source: Ambulatory Visit | Attending: Cardiology | Admitting: Cardiology

## 2015-06-14 ENCOUNTER — Encounter (HOSPITAL_COMMUNITY)
Admission: RE | Admit: 2015-06-14 | Discharge: 2015-06-14 | Disposition: A | Discharge status: Home / Self Care | Payer: Medicare Other | Source: Ambulatory Visit | Attending: Cardiology | Admitting: Cardiology

## 2015-06-14 ENCOUNTER — Other Ambulatory Visit: Payer: Self-pay | Admitting: Cardiology

## 2015-06-14 NOTE — Telephone Encounter (Signed)
REFILL 

## 2015-06-17 ENCOUNTER — Encounter (HOSPITAL_COMMUNITY)
Admission: RE | Admit: 2015-06-17 | Discharge: 2015-06-17 | Disposition: A | Discharge status: Home / Self Care | Payer: Medicare Other | Source: Ambulatory Visit | Attending: Cardiology | Admitting: Cardiology

## 2015-06-19 ENCOUNTER — Encounter (HOSPITAL_COMMUNITY)
Admission: RE | Admit: 2015-06-19 | Discharge: 2015-06-19 | Disposition: A | Discharge status: Home / Self Care | Payer: Medicare Other | Source: Ambulatory Visit | Attending: Cardiology | Admitting: Cardiology

## 2015-06-20 ENCOUNTER — Encounter: Payer: Self-pay | Admitting: Cardiology

## 2015-06-20 ENCOUNTER — Ambulatory Visit (INDEPENDENT_AMBULATORY_CARE_PROVIDER_SITE_OTHER): Payer: Medicare Other | Admitting: Cardiology

## 2015-06-20 VITALS — BP 130/76 | HR 52 | Ht 70.5 in | Wt 175.2 lb

## 2015-06-20 DIAGNOSIS — I2510 Atherosclerotic heart disease of native coronary artery without angina pectoris: Secondary | ICD-10-CM | POA: Diagnosis not present

## 2015-06-20 DIAGNOSIS — E785 Hyperlipidemia, unspecified: Secondary | ICD-10-CM

## 2015-06-20 NOTE — Progress Notes (Signed)
Joseph Roman Date of Birth: 02/09/1930 Medical Record #[id number]  History of Present Illness: Joseph Roman is seen for yearly followup of CAD. He has a history of coronary disease and is status post inferior lateral myocardial infarction in June of 2009. He had stenting of the mid left circumflex coronary emergently with a 2.75 x 23 mm Promus stent. His last stress test in July of 2015 was without ischemia at excellent exercise level. On followup today he is doing very well.  He denies any symptoms of chest pain or shortness of breath. He remains physically active and still goes to Cardiac Rehab 3 days a week. He is still working full time. He did develop leg fatigue and cramps on Crestor. He was switched to pravastatin and is tolerating this well.   Current Outpatient Prescriptions on File Prior to Visit  Medication Sig Dispense Refill  . aspirin 81 MG tablet Take 81 mg by mouth daily.    . clopidogrel (PLAVIX) 75 MG tablet Take 1 tablet (75 mg total) by mouth once. PATIENT NEEDS TO CONTACT OFFICE FOR ADDITIONAL REFILLS 30 tablet 0  . levothyroxine (SYNTHROID, LEVOTHROID) 112 MCG tablet Take 112 mcg by mouth daily.    . nitroGLYCERIN (NITROSTAT) 0.4 MG SL tablet Place 1 tablet (0.4 mg total) under the tongue every 5 (five) minutes as needed for chest pain. 25 tablet 11  . pravastatin (PRAVACHOL) 10 MG tablet TAKE 1 TABLET (10 MG TOTAL) BY MOUTH DAILY. 30 tablet 0  . vitamin B-12 (CYANOCOBALAMIN) 1000 MCG tablet Take 1,000 mcg by mouth daily.     No current facility-administered medications on file prior to visit.    Allergies  Allergen Reactions  . Other     Beta blockers-bradycardia    Past Medical History  Diagnosis Date  . Acute inferolateral myocardial infarction     04/2008 2.75x23 Promus Lcx  . Coronary artery disease   . Hypothyroidism   . Dyslipidemia     Mild dyslipidemia  . GERD (gastroesophageal reflux disease)     Past Surgical History  Procedure Laterality  Date  . Cardiac catheterization  05/07/2008    Ejection fraction was estimated at 60%  . Colonoscopy  04/21/2005    with polypectomy  . Tonsillectomy    . Cataract      History  Smoking status  . Never Smoker   Smokeless tobacco  . Not on file    History  Alcohol Use: Not on file    Family History  Problem Relation Age of Onset  . Cancer - Lung Father     Review of Systems: As noted in history of present illness.  All other systems were reviewed and are negative.  Physical Exam: BP 130/76 mmHg  Pulse 52  Ht 5' 10.5" (1.791 m)  Wt 79.47 kg (175 lb 3.2 oz)  BMI 24.77 kg/m2 Is a pleasant, elderly white male in no acute distress.The patient is alert and oriented x 3. The skin is warm and dry.    The HEENT exam is normal.  The carotids are 2+ without bruits.  There is no thyromegaly.  There is no JVD.  The lungs are clear.    The heart exam reveals a regular rate with a normal S1 and S2.  There are no murmurs, gallops, or rubs.  The PMI is not displaced.   Abdominal exam reveals good bowel sounds.   There is no hepatosplenomegaly or tenderness.  There are no masses.  Exam of the legs  reveal no clubbing, cyanosis, or edema.  The legs are without rashes.  The distal pulses are intact.  Cranial nerves II - XII are intact.  Motor and sensory functions are intact.  The gait is normal.  LABORATORY DATA: ECG today shows sinus bradycardia with a rate of 51 beats per minute. It is otherwise normal. I have personally reviewed and interpreted this study.  Labs reviewed from Joseph Roman 04/12/15: Normal CBC, chemistry panel, TSH and PSA. Cholesterol- 117, trig-53, HDL-40, LDL 66.  Assessment / Plan: 1. Coronary disease with remote myocardial infarction status post stenting of the left circumflex coronary in 2009. He is asymptomatic. GXT negative in July 2015. Continue DAPT and statin.   2. Dyslipidemia-well controlled on statin therapy. Tolerating pravastatin well.

## 2015-06-20 NOTE — Patient Instructions (Signed)
Continue your current therapy  I will see you in one year   

## 2015-06-21 ENCOUNTER — Encounter (HOSPITAL_COMMUNITY): Payer: Medicare Other

## 2015-06-24 ENCOUNTER — Encounter (HOSPITAL_COMMUNITY)
Admission: RE | Admit: 2015-06-24 | Discharge: 2015-06-24 | Disposition: A | Discharge status: Home / Self Care | Payer: Medicare Other | Source: Ambulatory Visit | Attending: Cardiology | Admitting: Cardiology

## 2015-06-25 DIAGNOSIS — H25032 Anterior subcapsular polar age-related cataract, left eye: Secondary | ICD-10-CM | POA: Diagnosis not present

## 2015-06-25 DIAGNOSIS — H21562 Pupillary abnormality, left eye: Secondary | ICD-10-CM | POA: Diagnosis not present

## 2015-06-25 DIAGNOSIS — H25012 Cortical age-related cataract, left eye: Secondary | ICD-10-CM | POA: Diagnosis not present

## 2015-06-25 DIAGNOSIS — H25042 Posterior subcapsular polar age-related cataract, left eye: Secondary | ICD-10-CM | POA: Diagnosis not present

## 2015-06-25 DIAGNOSIS — H25812 Combined forms of age-related cataract, left eye: Secondary | ICD-10-CM | POA: Diagnosis not present

## 2015-06-25 DIAGNOSIS — H2512 Age-related nuclear cataract, left eye: Secondary | ICD-10-CM | POA: Diagnosis not present

## 2015-06-26 ENCOUNTER — Encounter (HOSPITAL_COMMUNITY): Payer: Medicare Other

## 2015-06-28 ENCOUNTER — Encounter: Payer: Self-pay | Admitting: Cardiology

## 2015-06-28 ENCOUNTER — Encounter (HOSPITAL_COMMUNITY): Payer: Medicare Other

## 2015-07-01 ENCOUNTER — Encounter (HOSPITAL_COMMUNITY): Payer: Medicare Other

## 2015-07-01 DIAGNOSIS — Z5189 Encounter for other specified aftercare: Secondary | ICD-10-CM | POA: Insufficient documentation

## 2015-07-01 DIAGNOSIS — I2510 Atherosclerotic heart disease of native coronary artery without angina pectoris: Secondary | ICD-10-CM | POA: Insufficient documentation

## 2015-07-01 DIAGNOSIS — I10 Essential (primary) hypertension: Secondary | ICD-10-CM | POA: Insufficient documentation

## 2015-07-01 DIAGNOSIS — Z951 Presence of aortocoronary bypass graft: Secondary | ICD-10-CM | POA: Insufficient documentation

## 2015-07-01 DIAGNOSIS — I252 Old myocardial infarction: Secondary | ICD-10-CM | POA: Insufficient documentation

## 2015-07-03 ENCOUNTER — Other Ambulatory Visit: Payer: Self-pay | Admitting: Cardiology

## 2015-07-03 ENCOUNTER — Encounter (HOSPITAL_COMMUNITY)
Admission: RE | Admit: 2015-07-03 | Discharge: 2015-07-03 | Disposition: A | Discharge status: Home / Self Care | Payer: Self-pay | Source: Ambulatory Visit | Attending: Cardiology | Admitting: Cardiology

## 2015-07-03 NOTE — Telephone Encounter (Signed)
REFILL 

## 2015-07-05 ENCOUNTER — Encounter (HOSPITAL_COMMUNITY)
Admission: RE | Admit: 2015-07-05 | Discharge: 2015-07-05 | Disposition: A | Discharge status: Home / Self Care | Payer: Self-pay | Source: Ambulatory Visit | Attending: Cardiology | Admitting: Cardiology

## 2015-07-08 ENCOUNTER — Encounter (HOSPITAL_COMMUNITY)
Admission: RE | Admit: 2015-07-08 | Discharge: 2015-07-08 | Disposition: A | Discharge status: Home / Self Care | Payer: Self-pay | Source: Ambulatory Visit | Attending: Cardiology | Admitting: Cardiology

## 2015-07-10 ENCOUNTER — Encounter (HOSPITAL_COMMUNITY)
Admission: RE | Admit: 2015-07-10 | Discharge: 2015-07-10 | Disposition: A | Discharge status: Home / Self Care | Payer: Self-pay | Source: Ambulatory Visit | Attending: Cardiology | Admitting: Cardiology

## 2015-07-11 ENCOUNTER — Other Ambulatory Visit: Payer: Self-pay | Admitting: Cardiology

## 2015-07-11 NOTE — Telephone Encounter (Signed)
REFILL 

## 2015-07-12 ENCOUNTER — Encounter (HOSPITAL_COMMUNITY)
Admission: RE | Admit: 2015-07-12 | Discharge: 2015-07-12 | Disposition: A | Discharge status: Home / Self Care | Payer: Self-pay | Source: Ambulatory Visit | Attending: Cardiology | Admitting: Cardiology

## 2015-07-15 ENCOUNTER — Encounter (HOSPITAL_COMMUNITY)
Admission: RE | Admit: 2015-07-15 | Discharge: 2015-07-15 | Disposition: A | Discharge status: Home / Self Care | Payer: Self-pay | Source: Ambulatory Visit | Attending: Cardiology | Admitting: Cardiology

## 2015-07-17 ENCOUNTER — Encounter (HOSPITAL_COMMUNITY)
Admission: RE | Admit: 2015-07-17 | Discharge: 2015-07-17 | Disposition: A | Discharge status: Home / Self Care | Payer: Self-pay | Source: Ambulatory Visit | Attending: Cardiology | Admitting: Cardiology

## 2015-07-19 ENCOUNTER — Encounter (HOSPITAL_COMMUNITY)
Admission: RE | Admit: 2015-07-19 | Discharge: 2015-07-19 | Disposition: A | Discharge status: Home / Self Care | Payer: Self-pay | Source: Ambulatory Visit | Attending: Cardiology | Admitting: Cardiology

## 2015-07-22 ENCOUNTER — Encounter (HOSPITAL_COMMUNITY)
Admission: RE | Admit: 2015-07-22 | Discharge: 2015-07-22 | Disposition: A | Discharge status: Home / Self Care | Payer: Self-pay | Source: Ambulatory Visit | Attending: Cardiology | Admitting: Cardiology

## 2015-07-24 ENCOUNTER — Encounter (HOSPITAL_COMMUNITY)
Admission: RE | Admit: 2015-07-24 | Discharge: 2015-07-24 | Disposition: A | Discharge status: Home / Self Care | Payer: Self-pay | Source: Ambulatory Visit | Attending: Cardiology | Admitting: Cardiology

## 2015-07-26 ENCOUNTER — Encounter (HOSPITAL_COMMUNITY)
Admission: RE | Admit: 2015-07-26 | Discharge: 2015-07-26 | Disposition: A | Discharge status: Home / Self Care | Payer: Self-pay | Source: Ambulatory Visit | Attending: Cardiology | Admitting: Cardiology

## 2015-07-29 ENCOUNTER — Encounter (HOSPITAL_COMMUNITY)
Admission: RE | Admit: 2015-07-29 | Discharge: 2015-07-29 | Disposition: A | Discharge status: Home / Self Care | Payer: Self-pay | Source: Ambulatory Visit | Attending: Cardiology | Admitting: Cardiology

## 2015-07-31 ENCOUNTER — Encounter (HOSPITAL_COMMUNITY): Payer: Self-pay

## 2015-08-02 ENCOUNTER — Encounter (HOSPITAL_COMMUNITY)
Admission: RE | Admit: 2015-08-02 | Discharge: 2015-08-02 | Disposition: A | Discharge status: Home / Self Care | Payer: Self-pay | Source: Ambulatory Visit | Attending: Cardiology | Admitting: Cardiology

## 2015-08-02 DIAGNOSIS — Z951 Presence of aortocoronary bypass graft: Secondary | ICD-10-CM | POA: Insufficient documentation

## 2015-08-02 DIAGNOSIS — I252 Old myocardial infarction: Secondary | ICD-10-CM | POA: Insufficient documentation

## 2015-08-02 DIAGNOSIS — I2510 Atherosclerotic heart disease of native coronary artery without angina pectoris: Secondary | ICD-10-CM | POA: Insufficient documentation

## 2015-08-02 DIAGNOSIS — I10 Essential (primary) hypertension: Secondary | ICD-10-CM | POA: Insufficient documentation

## 2015-08-02 DIAGNOSIS — Z5189 Encounter for other specified aftercare: Secondary | ICD-10-CM | POA: Insufficient documentation

## 2015-08-07 ENCOUNTER — Encounter (HOSPITAL_COMMUNITY)
Admission: RE | Admit: 2015-08-07 | Discharge: 2015-08-07 | Disposition: A | Discharge status: Home / Self Care | Payer: Self-pay | Source: Ambulatory Visit | Attending: Cardiology | Admitting: Cardiology

## 2015-08-09 ENCOUNTER — Encounter (HOSPITAL_COMMUNITY)
Admission: RE | Admit: 2015-08-09 | Discharge: 2015-08-09 | Disposition: A | Discharge status: Home / Self Care | Payer: Self-pay | Source: Ambulatory Visit | Attending: Cardiology | Admitting: Cardiology

## 2015-08-12 ENCOUNTER — Encounter (HOSPITAL_COMMUNITY)
Admission: RE | Admit: 2015-08-12 | Discharge: 2015-08-12 | Disposition: A | Discharge status: Home / Self Care | Payer: Self-pay | Source: Ambulatory Visit | Attending: Cardiology | Admitting: Cardiology

## 2015-08-13 ENCOUNTER — Other Ambulatory Visit: Payer: Self-pay | Admitting: Cardiology

## 2015-08-14 ENCOUNTER — Encounter (HOSPITAL_COMMUNITY)
Admission: RE | Admit: 2015-08-14 | Discharge: 2015-08-14 | Disposition: A | Discharge status: Home / Self Care | Payer: Self-pay | Source: Ambulatory Visit | Attending: Cardiology | Admitting: Cardiology

## 2015-08-16 ENCOUNTER — Encounter (HOSPITAL_COMMUNITY)
Admission: RE | Admit: 2015-08-16 | Discharge: 2015-08-16 | Disposition: A | Discharge status: Home / Self Care | Payer: Self-pay | Source: Ambulatory Visit | Attending: Cardiology | Admitting: Cardiology

## 2015-08-17 DIAGNOSIS — Z23 Encounter for immunization: Secondary | ICD-10-CM | POA: Diagnosis not present

## 2015-08-19 ENCOUNTER — Encounter (HOSPITAL_COMMUNITY)
Admission: RE | Admit: 2015-08-19 | Discharge: 2015-08-19 | Disposition: A | Discharge status: Home / Self Care | Payer: Medicare Other | Source: Ambulatory Visit | Attending: Cardiology | Admitting: Cardiology

## 2015-08-21 ENCOUNTER — Encounter (HOSPITAL_COMMUNITY): Payer: Self-pay

## 2015-08-23 ENCOUNTER — Encounter (HOSPITAL_COMMUNITY): Payer: Self-pay

## 2015-08-26 ENCOUNTER — Encounter (HOSPITAL_COMMUNITY)
Admission: RE | Admit: 2015-08-26 | Discharge: 2015-08-26 | Disposition: A | Discharge status: Home / Self Care | Payer: Self-pay | Source: Ambulatory Visit | Attending: Cardiology | Admitting: Cardiology

## 2015-08-28 ENCOUNTER — Encounter (HOSPITAL_COMMUNITY): Payer: Self-pay

## 2015-08-30 ENCOUNTER — Encounter (HOSPITAL_COMMUNITY): Payer: Self-pay

## 2015-09-02 ENCOUNTER — Encounter (HOSPITAL_COMMUNITY)
Admission: RE | Admit: 2015-09-02 | Discharge: 2015-09-02 | Disposition: A | Discharge status: Home / Self Care | Payer: Self-pay | Source: Ambulatory Visit | Attending: Cardiology | Admitting: Cardiology

## 2015-09-02 DIAGNOSIS — I10 Essential (primary) hypertension: Secondary | ICD-10-CM | POA: Insufficient documentation

## 2015-09-02 DIAGNOSIS — Z5189 Encounter for other specified aftercare: Secondary | ICD-10-CM | POA: Insufficient documentation

## 2015-09-02 DIAGNOSIS — I2510 Atherosclerotic heart disease of native coronary artery without angina pectoris: Secondary | ICD-10-CM | POA: Insufficient documentation

## 2015-09-02 DIAGNOSIS — I252 Old myocardial infarction: Secondary | ICD-10-CM | POA: Insufficient documentation

## 2015-09-02 DIAGNOSIS — Z951 Presence of aortocoronary bypass graft: Secondary | ICD-10-CM | POA: Insufficient documentation

## 2015-09-04 ENCOUNTER — Encounter (HOSPITAL_COMMUNITY)
Admission: RE | Admit: 2015-09-04 | Discharge: 2015-09-04 | Disposition: A | Discharge status: Home / Self Care | Payer: Self-pay | Source: Ambulatory Visit | Attending: Cardiology | Admitting: Cardiology

## 2015-09-06 ENCOUNTER — Encounter (HOSPITAL_COMMUNITY)
Admission: RE | Admit: 2015-09-06 | Discharge: 2015-09-06 | Disposition: A | Discharge status: Home / Self Care | Payer: Self-pay | Source: Ambulatory Visit | Attending: Cardiology | Admitting: Cardiology

## 2015-09-09 ENCOUNTER — Encounter (HOSPITAL_COMMUNITY)
Admission: RE | Admit: 2015-09-09 | Discharge: 2015-09-09 | Disposition: A | Discharge status: Home / Self Care | Payer: Self-pay | Source: Ambulatory Visit | Attending: Cardiology | Admitting: Cardiology

## 2015-09-11 ENCOUNTER — Encounter (HOSPITAL_COMMUNITY)
Admission: RE | Admit: 2015-09-11 | Discharge: 2015-09-11 | Disposition: A | Discharge status: Home / Self Care | Payer: Self-pay | Source: Ambulatory Visit | Attending: Cardiology | Admitting: Cardiology

## 2015-09-13 ENCOUNTER — Encounter (HOSPITAL_COMMUNITY)
Admission: RE | Admit: 2015-09-13 | Discharge: 2015-09-13 | Disposition: A | Discharge status: Home / Self Care | Payer: Self-pay | Source: Ambulatory Visit | Attending: Cardiology | Admitting: Cardiology

## 2015-09-16 ENCOUNTER — Encounter (HOSPITAL_COMMUNITY)
Admission: RE | Admit: 2015-09-16 | Discharge: 2015-09-16 | Disposition: A | Discharge status: Home / Self Care | Payer: Self-pay | Source: Ambulatory Visit | Attending: Cardiology | Admitting: Cardiology

## 2015-09-18 ENCOUNTER — Encounter (HOSPITAL_COMMUNITY)
Admission: RE | Admit: 2015-09-18 | Discharge: 2015-09-18 | Disposition: A | Discharge status: Home / Self Care | Payer: Self-pay | Source: Ambulatory Visit | Attending: Cardiology | Admitting: Cardiology

## 2015-09-20 ENCOUNTER — Encounter (HOSPITAL_COMMUNITY)
Admission: RE | Admit: 2015-09-20 | Discharge: 2015-09-20 | Disposition: A | Discharge status: Home / Self Care | Payer: Self-pay | Source: Ambulatory Visit | Attending: Cardiology | Admitting: Cardiology

## 2015-09-23 ENCOUNTER — Telehealth (HOSPITAL_COMMUNITY): Payer: Self-pay | Admitting: *Deleted

## 2015-09-23 ENCOUNTER — Encounter (HOSPITAL_COMMUNITY): Admission: RE | Admit: 2015-09-23 | Payer: Self-pay | Source: Ambulatory Visit

## 2015-09-25 ENCOUNTER — Encounter (HOSPITAL_COMMUNITY)
Admission: RE | Admit: 2015-09-25 | Discharge: 2015-09-25 | Disposition: A | Discharge status: Home / Self Care | Payer: Self-pay | Source: Ambulatory Visit | Attending: Cardiology | Admitting: Cardiology

## 2015-09-27 ENCOUNTER — Encounter (HOSPITAL_COMMUNITY): Payer: Self-pay

## 2015-09-30 ENCOUNTER — Encounter (HOSPITAL_COMMUNITY)
Admission: RE | Admit: 2015-09-30 | Discharge: 2015-09-30 | Disposition: A | Discharge status: Home / Self Care | Payer: Self-pay | Source: Ambulatory Visit | Attending: Cardiology | Admitting: Cardiology

## 2015-10-02 ENCOUNTER — Encounter (HOSPITAL_COMMUNITY)
Admission: RE | Admit: 2015-10-02 | Discharge: 2015-10-02 | Disposition: A | Discharge status: Home / Self Care | Payer: Self-pay | Source: Ambulatory Visit | Attending: Cardiology | Admitting: Cardiology

## 2015-10-02 DIAGNOSIS — Z955 Presence of coronary angioplasty implant and graft: Secondary | ICD-10-CM | POA: Insufficient documentation

## 2015-10-02 DIAGNOSIS — Z48812 Encounter for surgical aftercare following surgery on the circulatory system: Secondary | ICD-10-CM | POA: Insufficient documentation

## 2015-10-04 ENCOUNTER — Encounter (HOSPITAL_COMMUNITY)
Admission: RE | Admit: 2015-10-04 | Discharge: 2015-10-04 | Disposition: A | Discharge status: Home / Self Care | Payer: Self-pay | Source: Ambulatory Visit | Attending: Cardiology | Admitting: Cardiology

## 2015-10-07 ENCOUNTER — Encounter (HOSPITAL_COMMUNITY)
Admission: RE | Admit: 2015-10-07 | Discharge: 2015-10-07 | Disposition: A | Discharge status: Home / Self Care | Payer: Self-pay | Source: Ambulatory Visit | Attending: Cardiology | Admitting: Cardiology

## 2015-10-09 ENCOUNTER — Encounter (HOSPITAL_COMMUNITY): Payer: Self-pay

## 2015-10-11 ENCOUNTER — Encounter (HOSPITAL_COMMUNITY)
Admission: RE | Admit: 2015-10-11 | Discharge: 2015-10-11 | Disposition: A | Discharge status: Home / Self Care | Payer: Self-pay | Source: Ambulatory Visit | Attending: Cardiology | Admitting: Cardiology

## 2015-10-14 ENCOUNTER — Encounter (HOSPITAL_COMMUNITY)
Admission: RE | Admit: 2015-10-14 | Discharge: 2015-10-14 | Disposition: A | Discharge status: Home / Self Care | Payer: Self-pay | Source: Ambulatory Visit | Attending: Cardiology | Admitting: Cardiology

## 2015-10-16 ENCOUNTER — Encounter (HOSPITAL_COMMUNITY)
Admission: RE | Admit: 2015-10-16 | Discharge: 2015-10-16 | Disposition: A | Discharge status: Home / Self Care | Payer: Self-pay | Source: Ambulatory Visit | Attending: Cardiology | Admitting: Cardiology

## 2015-10-18 ENCOUNTER — Encounter (HOSPITAL_COMMUNITY)
Admission: RE | Admit: 2015-10-18 | Discharge: 2015-10-18 | Disposition: A | Discharge status: Home / Self Care | Payer: Self-pay | Source: Ambulatory Visit | Attending: Cardiology | Admitting: Cardiology

## 2015-10-21 ENCOUNTER — Encounter (HOSPITAL_COMMUNITY)
Admission: RE | Admit: 2015-10-21 | Discharge: 2015-10-21 | Disposition: A | Discharge status: Home / Self Care | Payer: Self-pay | Source: Ambulatory Visit | Attending: Cardiology | Admitting: Cardiology

## 2015-10-23 ENCOUNTER — Encounter (HOSPITAL_COMMUNITY)
Admission: RE | Admit: 2015-10-23 | Discharge: 2015-10-23 | Disposition: A | Discharge status: Home / Self Care | Payer: Self-pay | Source: Ambulatory Visit | Attending: Cardiology | Admitting: Cardiology

## 2015-10-28 ENCOUNTER — Encounter (HOSPITAL_COMMUNITY)
Admission: RE | Admit: 2015-10-28 | Discharge: 2015-10-28 | Disposition: A | Discharge status: Home / Self Care | Payer: Self-pay | Source: Ambulatory Visit | Attending: Cardiology | Admitting: Cardiology

## 2015-10-30 ENCOUNTER — Encounter (HOSPITAL_COMMUNITY): Payer: Self-pay

## 2015-11-01 ENCOUNTER — Encounter (HOSPITAL_COMMUNITY)
Admission: RE | Admit: 2015-11-01 | Discharge: 2015-11-01 | Disposition: A | Discharge status: Home / Self Care | Payer: Self-pay | Source: Ambulatory Visit | Attending: Cardiology | Admitting: Cardiology

## 2015-11-01 DIAGNOSIS — Z955 Presence of coronary angioplasty implant and graft: Secondary | ICD-10-CM | POA: Insufficient documentation

## 2015-11-01 DIAGNOSIS — Z48812 Encounter for surgical aftercare following surgery on the circulatory system: Secondary | ICD-10-CM | POA: Insufficient documentation

## 2015-11-04 ENCOUNTER — Encounter (HOSPITAL_COMMUNITY)
Admission: RE | Admit: 2015-11-04 | Discharge: 2015-11-04 | Disposition: A | Discharge status: Home / Self Care | Payer: Self-pay | Source: Ambulatory Visit | Attending: Cardiology | Admitting: Cardiology

## 2015-11-06 ENCOUNTER — Encounter (HOSPITAL_COMMUNITY)
Admission: RE | Admit: 2015-11-06 | Discharge: 2015-11-06 | Disposition: A | Discharge status: Home / Self Care | Payer: Self-pay | Source: Ambulatory Visit | Attending: Cardiology | Admitting: Cardiology

## 2015-11-08 ENCOUNTER — Encounter (HOSPITAL_COMMUNITY)
Admission: RE | Admit: 2015-11-08 | Discharge: 2015-11-08 | Disposition: A | Discharge status: Home / Self Care | Payer: Self-pay | Source: Ambulatory Visit | Attending: Cardiology | Admitting: Cardiology

## 2015-11-11 ENCOUNTER — Encounter (HOSPITAL_COMMUNITY)
Admission: RE | Admit: 2015-11-11 | Discharge: 2015-11-11 | Disposition: A | Discharge status: Home / Self Care | Payer: Self-pay | Source: Ambulatory Visit | Attending: Cardiology | Admitting: Cardiology

## 2015-11-13 ENCOUNTER — Encounter (HOSPITAL_COMMUNITY): Payer: Self-pay

## 2015-11-15 ENCOUNTER — Encounter (HOSPITAL_COMMUNITY)
Admission: RE | Admit: 2015-11-15 | Discharge: 2015-11-15 | Disposition: A | Discharge status: Home / Self Care | Payer: Self-pay | Source: Ambulatory Visit | Attending: Cardiology | Admitting: Cardiology

## 2015-11-18 ENCOUNTER — Encounter (HOSPITAL_COMMUNITY)
Admission: RE | Admit: 2015-11-18 | Discharge: 2015-11-18 | Disposition: A | Discharge status: Home / Self Care | Payer: Self-pay | Source: Ambulatory Visit | Attending: Cardiology | Admitting: Cardiology

## 2015-11-20 ENCOUNTER — Encounter (HOSPITAL_COMMUNITY)
Admission: RE | Admit: 2015-11-20 | Discharge: 2015-11-20 | Disposition: A | Discharge status: Home / Self Care | Payer: Self-pay | Source: Ambulatory Visit | Attending: Cardiology | Admitting: Cardiology

## 2015-11-22 ENCOUNTER — Encounter (HOSPITAL_COMMUNITY)
Admission: RE | Admit: 2015-11-22 | Discharge: 2015-11-22 | Disposition: A | Discharge status: Home / Self Care | Payer: Self-pay | Source: Ambulatory Visit | Attending: Cardiology | Admitting: Cardiology

## 2015-11-27 ENCOUNTER — Encounter (HOSPITAL_COMMUNITY): Payer: Self-pay

## 2015-11-29 ENCOUNTER — Encounter (HOSPITAL_COMMUNITY)
Admission: RE | Admit: 2015-11-29 | Discharge: 2015-11-29 | Disposition: A | Discharge status: Home / Self Care | Payer: Self-pay | Source: Ambulatory Visit | Attending: Cardiology | Admitting: Cardiology

## 2015-12-04 ENCOUNTER — Encounter (HOSPITAL_COMMUNITY)
Admission: RE | Admit: 2015-12-04 | Discharge: 2015-12-04 | Disposition: A | Discharge status: Home / Self Care | Payer: Self-pay | Source: Ambulatory Visit | Attending: Cardiology | Admitting: Cardiology

## 2015-12-04 DIAGNOSIS — Z955 Presence of coronary angioplasty implant and graft: Secondary | ICD-10-CM | POA: Insufficient documentation

## 2015-12-04 DIAGNOSIS — Z48812 Encounter for surgical aftercare following surgery on the circulatory system: Secondary | ICD-10-CM | POA: Insufficient documentation

## 2015-12-06 ENCOUNTER — Encounter (HOSPITAL_COMMUNITY)
Admission: RE | Admit: 2015-12-06 | Discharge: 2015-12-06 | Disposition: A | Discharge status: Home / Self Care | Payer: Self-pay | Source: Ambulatory Visit | Attending: Cardiology | Admitting: Cardiology

## 2015-12-09 ENCOUNTER — Encounter (HOSPITAL_COMMUNITY)
Admission: RE | Admit: 2015-12-09 | Discharge: 2015-12-09 | Disposition: A | Discharge status: Home / Self Care | Payer: Self-pay | Source: Ambulatory Visit | Attending: Cardiology | Admitting: Cardiology

## 2015-12-10 DIAGNOSIS — L570 Actinic keratosis: Secondary | ICD-10-CM | POA: Diagnosis not present

## 2015-12-10 DIAGNOSIS — D1801 Hemangioma of skin and subcutaneous tissue: Secondary | ICD-10-CM | POA: Diagnosis not present

## 2015-12-10 DIAGNOSIS — L853 Xerosis cutis: Secondary | ICD-10-CM | POA: Diagnosis not present

## 2015-12-10 DIAGNOSIS — L812 Freckles: Secondary | ICD-10-CM | POA: Diagnosis not present

## 2015-12-10 DIAGNOSIS — L821 Other seborrheic keratosis: Secondary | ICD-10-CM | POA: Diagnosis not present

## 2015-12-10 DIAGNOSIS — D225 Melanocytic nevi of trunk: Secondary | ICD-10-CM | POA: Diagnosis not present

## 2015-12-11 ENCOUNTER — Encounter (HOSPITAL_COMMUNITY): Payer: Self-pay

## 2015-12-11 DIAGNOSIS — M25531 Pain in right wrist: Secondary | ICD-10-CM | POA: Diagnosis not present

## 2015-12-11 DIAGNOSIS — M79641 Pain in right hand: Secondary | ICD-10-CM | POA: Diagnosis not present

## 2015-12-13 ENCOUNTER — Encounter (HOSPITAL_COMMUNITY)
Admission: RE | Admit: 2015-12-13 | Discharge: 2015-12-13 | Disposition: A | Discharge status: Home / Self Care | Payer: Self-pay | Source: Ambulatory Visit | Attending: Cardiology | Admitting: Cardiology

## 2015-12-16 ENCOUNTER — Encounter (HOSPITAL_COMMUNITY): Payer: Self-pay

## 2015-12-18 ENCOUNTER — Encounter (HOSPITAL_COMMUNITY): Payer: Self-pay

## 2015-12-20 ENCOUNTER — Encounter (HOSPITAL_COMMUNITY)
Admission: RE | Admit: 2015-12-20 | Discharge: 2015-12-20 | Disposition: A | Discharge status: Home / Self Care | Payer: Self-pay | Source: Ambulatory Visit | Attending: Cardiology | Admitting: Cardiology

## 2015-12-23 ENCOUNTER — Encounter (HOSPITAL_COMMUNITY)
Admission: RE | Admit: 2015-12-23 | Discharge: 2015-12-23 | Disposition: A | Discharge status: Home / Self Care | Payer: Medicare Other | Source: Ambulatory Visit | Attending: Cardiology | Admitting: Cardiology

## 2015-12-25 ENCOUNTER — Encounter (HOSPITAL_COMMUNITY)
Admission: RE | Admit: 2015-12-25 | Discharge: 2015-12-25 | Disposition: A | Discharge status: Home / Self Care | Payer: Self-pay | Source: Ambulatory Visit | Attending: Cardiology | Admitting: Cardiology

## 2015-12-27 ENCOUNTER — Encounter (HOSPITAL_COMMUNITY)
Admission: RE | Admit: 2015-12-27 | Discharge: 2015-12-27 | Disposition: A | Discharge status: Home / Self Care | Payer: Self-pay | Source: Ambulatory Visit | Attending: Cardiology | Admitting: Cardiology

## 2015-12-30 ENCOUNTER — Encounter (HOSPITAL_COMMUNITY): Payer: Self-pay

## 2016-01-01 ENCOUNTER — Encounter (HOSPITAL_COMMUNITY)
Admission: RE | Admit: 2016-01-01 | Discharge: 2016-01-01 | Disposition: A | Discharge status: Home / Self Care | Payer: Self-pay | Source: Ambulatory Visit | Attending: Cardiology | Admitting: Cardiology

## 2016-01-01 DIAGNOSIS — Z48812 Encounter for surgical aftercare following surgery on the circulatory system: Secondary | ICD-10-CM | POA: Insufficient documentation

## 2016-01-01 DIAGNOSIS — Z955 Presence of coronary angioplasty implant and graft: Secondary | ICD-10-CM | POA: Insufficient documentation

## 2016-01-03 ENCOUNTER — Encounter (HOSPITAL_COMMUNITY)
Admission: RE | Admit: 2016-01-03 | Discharge: 2016-01-03 | Disposition: A | Discharge status: Home / Self Care | Payer: Self-pay | Source: Ambulatory Visit | Attending: Cardiology | Admitting: Cardiology

## 2016-01-06 ENCOUNTER — Encounter (HOSPITAL_COMMUNITY)
Admission: RE | Admit: 2016-01-06 | Discharge: 2016-01-06 | Disposition: A | Discharge status: Home / Self Care | Payer: Self-pay | Source: Ambulatory Visit | Attending: Cardiology | Admitting: Cardiology

## 2016-01-08 ENCOUNTER — Encounter (HOSPITAL_COMMUNITY)
Admission: RE | Admit: 2016-01-08 | Discharge: 2016-01-08 | Disposition: A | Discharge status: Home / Self Care | Payer: Self-pay | Source: Ambulatory Visit | Attending: Cardiology | Admitting: Cardiology

## 2016-01-10 ENCOUNTER — Encounter (HOSPITAL_COMMUNITY): Payer: Self-pay

## 2016-01-13 ENCOUNTER — Encounter (HOSPITAL_COMMUNITY)
Admission: RE | Admit: 2016-01-13 | Discharge: 2016-01-13 | Disposition: A | Discharge status: Home / Self Care | Payer: Self-pay | Source: Ambulatory Visit | Attending: Cardiology | Admitting: Cardiology

## 2016-01-15 ENCOUNTER — Encounter (HOSPITAL_COMMUNITY)
Admission: RE | Admit: 2016-01-15 | Discharge: 2016-01-15 | Disposition: A | Discharge status: Home / Self Care | Payer: Self-pay | Source: Ambulatory Visit | Attending: Cardiology | Admitting: Cardiology

## 2016-01-17 ENCOUNTER — Encounter (HOSPITAL_COMMUNITY)
Admission: RE | Admit: 2016-01-17 | Discharge: 2016-01-17 | Disposition: A | Discharge status: Home / Self Care | Payer: Self-pay | Source: Ambulatory Visit | Attending: Cardiology | Admitting: Cardiology

## 2016-01-20 ENCOUNTER — Encounter (HOSPITAL_COMMUNITY)
Admission: RE | Admit: 2016-01-20 | Discharge: 2016-01-20 | Disposition: A | Discharge status: Home / Self Care | Payer: Self-pay | Source: Ambulatory Visit | Attending: Cardiology | Admitting: Cardiology

## 2016-01-22 ENCOUNTER — Encounter (HOSPITAL_COMMUNITY)
Admission: RE | Admit: 2016-01-22 | Discharge: 2016-01-22 | Disposition: A | Discharge status: Home / Self Care | Payer: Self-pay | Source: Ambulatory Visit | Attending: Cardiology | Admitting: Cardiology

## 2016-01-24 ENCOUNTER — Encounter (HOSPITAL_COMMUNITY)
Admission: RE | Admit: 2016-01-24 | Discharge: 2016-01-24 | Disposition: A | Discharge status: Home / Self Care | Payer: Self-pay | Source: Ambulatory Visit | Attending: Cardiology | Admitting: Cardiology

## 2016-01-27 ENCOUNTER — Encounter (HOSPITAL_COMMUNITY): Payer: Self-pay

## 2016-01-29 ENCOUNTER — Encounter (HOSPITAL_COMMUNITY): Payer: Self-pay

## 2016-01-29 DIAGNOSIS — Z48812 Encounter for surgical aftercare following surgery on the circulatory system: Secondary | ICD-10-CM | POA: Insufficient documentation

## 2016-01-29 DIAGNOSIS — Z955 Presence of coronary angioplasty implant and graft: Secondary | ICD-10-CM | POA: Insufficient documentation

## 2016-01-31 ENCOUNTER — Encounter (HOSPITAL_COMMUNITY)
Admission: RE | Admit: 2016-01-31 | Discharge: 2016-01-31 | Disposition: A | Discharge status: Home / Self Care | Payer: Self-pay | Source: Ambulatory Visit | Attending: Cardiology | Admitting: Cardiology

## 2016-02-03 ENCOUNTER — Encounter (HOSPITAL_COMMUNITY)
Admission: RE | Admit: 2016-02-03 | Discharge: 2016-02-03 | Disposition: A | Discharge status: Home / Self Care | Payer: Self-pay | Source: Ambulatory Visit | Attending: Cardiology | Admitting: Cardiology

## 2016-02-05 ENCOUNTER — Encounter (HOSPITAL_COMMUNITY): Payer: Self-pay

## 2016-02-07 ENCOUNTER — Encounter (HOSPITAL_COMMUNITY): Payer: Self-pay

## 2016-02-10 ENCOUNTER — Encounter (HOSPITAL_COMMUNITY)
Admission: RE | Admit: 2016-02-10 | Discharge: 2016-02-10 | Disposition: A | Discharge status: Home / Self Care | Payer: Self-pay | Source: Ambulatory Visit | Attending: Cardiology | Admitting: Cardiology

## 2016-02-12 ENCOUNTER — Encounter (HOSPITAL_COMMUNITY)
Admission: RE | Admit: 2016-02-12 | Discharge: 2016-02-12 | Disposition: A | Discharge status: Home / Self Care | Payer: Self-pay | Source: Ambulatory Visit | Attending: Cardiology | Admitting: Cardiology

## 2016-02-14 ENCOUNTER — Encounter (HOSPITAL_COMMUNITY)
Admission: RE | Admit: 2016-02-14 | Discharge: 2016-02-14 | Disposition: A | Discharge status: Home / Self Care | Payer: Self-pay | Source: Ambulatory Visit | Attending: Cardiology | Admitting: Cardiology

## 2016-02-17 ENCOUNTER — Encounter (HOSPITAL_COMMUNITY)
Admission: RE | Admit: 2016-02-17 | Discharge: 2016-02-17 | Disposition: A | Discharge status: Home / Self Care | Payer: Self-pay | Source: Ambulatory Visit | Attending: Cardiology | Admitting: Cardiology

## 2016-02-19 ENCOUNTER — Encounter (HOSPITAL_COMMUNITY)
Admission: RE | Admit: 2016-02-19 | Discharge: 2016-02-19 | Disposition: A | Discharge status: Home / Self Care | Payer: Self-pay | Source: Ambulatory Visit | Attending: Cardiology | Admitting: Cardiology

## 2016-02-21 ENCOUNTER — Encounter (HOSPITAL_COMMUNITY)
Admission: RE | Admit: 2016-02-21 | Discharge: 2016-02-21 | Disposition: A | Discharge status: Home / Self Care | Payer: Self-pay | Source: Ambulatory Visit | Attending: Cardiology | Admitting: Cardiology

## 2016-02-24 ENCOUNTER — Encounter (HOSPITAL_COMMUNITY): Payer: Self-pay

## 2016-02-26 ENCOUNTER — Encounter (HOSPITAL_COMMUNITY): Payer: Self-pay

## 2016-02-28 ENCOUNTER — Encounter (HOSPITAL_COMMUNITY): Payer: Self-pay

## 2016-03-02 ENCOUNTER — Encounter (HOSPITAL_COMMUNITY)
Admission: RE | Admit: 2016-03-02 | Discharge: 2016-03-02 | Disposition: A | Discharge status: Home / Self Care | Payer: Self-pay | Source: Ambulatory Visit | Attending: Cardiology | Admitting: Cardiology

## 2016-03-02 DIAGNOSIS — Z955 Presence of coronary angioplasty implant and graft: Secondary | ICD-10-CM | POA: Insufficient documentation

## 2016-03-02 DIAGNOSIS — Z48812 Encounter for surgical aftercare following surgery on the circulatory system: Secondary | ICD-10-CM | POA: Insufficient documentation

## 2016-03-04 ENCOUNTER — Encounter (HOSPITAL_COMMUNITY): Payer: Self-pay

## 2016-03-06 ENCOUNTER — Encounter (HOSPITAL_COMMUNITY): Payer: Self-pay

## 2016-03-09 ENCOUNTER — Encounter (HOSPITAL_COMMUNITY): Payer: Self-pay

## 2016-03-11 ENCOUNTER — Encounter (HOSPITAL_COMMUNITY): Payer: Self-pay

## 2016-03-13 ENCOUNTER — Encounter (HOSPITAL_COMMUNITY)
Admission: RE | Admit: 2016-03-13 | Discharge: 2016-03-13 | Disposition: A | Discharge status: Home / Self Care | Payer: Self-pay | Source: Ambulatory Visit | Attending: Cardiology | Admitting: Cardiology

## 2016-03-16 ENCOUNTER — Encounter (HOSPITAL_COMMUNITY)
Admission: RE | Admit: 2016-03-16 | Discharge: 2016-03-16 | Disposition: A | Discharge status: Home / Self Care | Payer: Self-pay | Source: Ambulatory Visit | Attending: Cardiology | Admitting: Cardiology

## 2016-03-18 ENCOUNTER — Encounter (HOSPITAL_COMMUNITY): Payer: Self-pay

## 2016-03-20 ENCOUNTER — Encounter (HOSPITAL_COMMUNITY): Payer: Self-pay

## 2016-03-20 ENCOUNTER — Other Ambulatory Visit: Payer: Self-pay

## 2016-03-20 MED ORDER — PRAVASTATIN SODIUM 10 MG PO TABS: ORAL_TABLET | ORAL | Status: DC

## 2016-03-23 ENCOUNTER — Encounter (HOSPITAL_COMMUNITY): Payer: Self-pay

## 2016-03-25 ENCOUNTER — Encounter (HOSPITAL_COMMUNITY): Payer: Self-pay

## 2016-03-27 ENCOUNTER — Encounter (HOSPITAL_COMMUNITY)
Admission: RE | Admit: 2016-03-27 | Discharge: 2016-03-27 | Disposition: A | Discharge status: Home / Self Care | Payer: Self-pay | Source: Ambulatory Visit | Attending: Cardiology | Admitting: Cardiology

## 2016-03-30 ENCOUNTER — Encounter (HOSPITAL_COMMUNITY): Payer: Self-pay

## 2016-03-30 DIAGNOSIS — I2510 Atherosclerotic heart disease of native coronary artery without angina pectoris: Secondary | ICD-10-CM | POA: Insufficient documentation

## 2016-04-01 ENCOUNTER — Encounter (HOSPITAL_COMMUNITY): Payer: Self-pay

## 2016-04-03 ENCOUNTER — Encounter (HOSPITAL_COMMUNITY): Payer: Self-pay

## 2016-04-06 ENCOUNTER — Encounter (HOSPITAL_COMMUNITY): Payer: Self-pay

## 2016-04-08 ENCOUNTER — Encounter (HOSPITAL_COMMUNITY): Payer: Self-pay

## 2016-04-10 ENCOUNTER — Encounter (HOSPITAL_COMMUNITY)
Admission: RE | Admit: 2016-04-10 | Discharge: 2016-04-10 | Disposition: A | Discharge status: Home / Self Care | Payer: Self-pay | Source: Ambulatory Visit | Attending: Cardiology | Admitting: Cardiology

## 2016-04-13 ENCOUNTER — Encounter (HOSPITAL_COMMUNITY): Payer: Self-pay

## 2016-04-15 ENCOUNTER — Encounter (HOSPITAL_COMMUNITY): Payer: Self-pay

## 2016-04-16 DIAGNOSIS — E538 Deficiency of other specified B group vitamins: Secondary | ICD-10-CM | POA: Diagnosis not present

## 2016-04-16 DIAGNOSIS — Z125 Encounter for screening for malignant neoplasm of prostate: Secondary | ICD-10-CM | POA: Diagnosis not present

## 2016-04-16 DIAGNOSIS — Z0000 Encounter for general adult medical examination without abnormal findings: Secondary | ICD-10-CM | POA: Diagnosis not present

## 2016-04-16 DIAGNOSIS — N400 Enlarged prostate without lower urinary tract symptoms: Secondary | ICD-10-CM | POA: Diagnosis not present

## 2016-04-16 DIAGNOSIS — D539 Nutritional anemia, unspecified: Secondary | ICD-10-CM | POA: Diagnosis not present

## 2016-04-16 DIAGNOSIS — E784 Other hyperlipidemia: Secondary | ICD-10-CM | POA: Diagnosis not present

## 2016-04-16 DIAGNOSIS — E038 Other specified hypothyroidism: Secondary | ICD-10-CM | POA: Diagnosis not present

## 2016-04-17 ENCOUNTER — Encounter (HOSPITAL_COMMUNITY): Payer: Self-pay

## 2016-04-20 ENCOUNTER — Encounter (HOSPITAL_COMMUNITY): Payer: Self-pay

## 2016-04-22 ENCOUNTER — Encounter (HOSPITAL_COMMUNITY): Payer: Self-pay

## 2016-04-23 DIAGNOSIS — E038 Other specified hypothyroidism: Secondary | ICD-10-CM | POA: Diagnosis not present

## 2016-04-23 DIAGNOSIS — Z6825 Body mass index (BMI) 25.0-25.9, adult: Secondary | ICD-10-CM | POA: Diagnosis not present

## 2016-04-23 DIAGNOSIS — Z1389 Encounter for screening for other disorder: Secondary | ICD-10-CM | POA: Diagnosis not present

## 2016-04-23 DIAGNOSIS — M189 Osteoarthritis of first carpometacarpal joint, unspecified: Secondary | ICD-10-CM | POA: Diagnosis not present

## 2016-04-23 DIAGNOSIS — I252 Old myocardial infarction: Secondary | ICD-10-CM | POA: Diagnosis not present

## 2016-04-23 DIAGNOSIS — R030 Elevated blood-pressure reading, without diagnosis of hypertension: Secondary | ICD-10-CM | POA: Diagnosis not present

## 2016-04-23 DIAGNOSIS — Z125 Encounter for screening for malignant neoplasm of prostate: Secondary | ICD-10-CM | POA: Diagnosis not present

## 2016-04-23 DIAGNOSIS — D539 Nutritional anemia, unspecified: Secondary | ICD-10-CM | POA: Diagnosis not present

## 2016-04-23 DIAGNOSIS — I2510 Atherosclerotic heart disease of native coronary artery without angina pectoris: Secondary | ICD-10-CM | POA: Diagnosis not present

## 2016-04-23 DIAGNOSIS — E784 Other hyperlipidemia: Secondary | ICD-10-CM | POA: Diagnosis not present

## 2016-04-23 DIAGNOSIS — Z0000 Encounter for general adult medical examination without abnormal findings: Secondary | ICD-10-CM | POA: Diagnosis not present

## 2016-04-23 DIAGNOSIS — N400 Enlarged prostate without lower urinary tract symptoms: Secondary | ICD-10-CM | POA: Diagnosis not present

## 2016-04-24 ENCOUNTER — Encounter (HOSPITAL_COMMUNITY)
Admission: RE | Admit: 2016-04-24 | Discharge: 2016-04-24 | Disposition: A | Discharge status: Home / Self Care | Payer: Self-pay | Source: Ambulatory Visit | Attending: Cardiology | Admitting: Cardiology

## 2016-04-29 ENCOUNTER — Encounter (HOSPITAL_COMMUNITY): Payer: Self-pay

## 2016-05-01 ENCOUNTER — Encounter (HOSPITAL_COMMUNITY)
Admission: RE | Admit: 2016-05-01 | Discharge: 2016-05-01 | Disposition: A | Discharge status: Home / Self Care | Payer: Self-pay | Source: Ambulatory Visit | Attending: Cardiology | Admitting: Cardiology

## 2016-05-01 DIAGNOSIS — I2510 Atherosclerotic heart disease of native coronary artery without angina pectoris: Secondary | ICD-10-CM | POA: Insufficient documentation

## 2016-05-04 ENCOUNTER — Encounter (HOSPITAL_COMMUNITY)
Admission: RE | Admit: 2016-05-04 | Discharge: 2016-05-04 | Disposition: A | Discharge status: Home / Self Care | Payer: Self-pay | Source: Ambulatory Visit | Attending: Cardiology | Admitting: Cardiology

## 2016-05-06 ENCOUNTER — Encounter (HOSPITAL_COMMUNITY)
Admission: RE | Admit: 2016-05-06 | Discharge: 2016-05-06 | Disposition: A | Discharge status: Home / Self Care | Payer: Self-pay | Source: Ambulatory Visit | Attending: Cardiology | Admitting: Cardiology

## 2016-05-08 ENCOUNTER — Encounter (HOSPITAL_COMMUNITY)
Admission: RE | Admit: 2016-05-08 | Discharge: 2016-05-08 | Disposition: A | Discharge status: Home / Self Care | Payer: Self-pay | Source: Ambulatory Visit | Attending: Cardiology | Admitting: Cardiology

## 2016-05-11 ENCOUNTER — Encounter (HOSPITAL_COMMUNITY)
Admission: RE | Admit: 2016-05-11 | Discharge: 2016-05-11 | Disposition: A | Discharge status: Home / Self Care | Payer: Self-pay | Source: Ambulatory Visit | Attending: Cardiology | Admitting: Cardiology

## 2016-05-13 ENCOUNTER — Encounter (HOSPITAL_COMMUNITY): Payer: Self-pay

## 2016-05-14 DIAGNOSIS — H26493 Other secondary cataract, bilateral: Secondary | ICD-10-CM | POA: Diagnosis not present

## 2016-05-14 DIAGNOSIS — Z961 Presence of intraocular lens: Secondary | ICD-10-CM | POA: Diagnosis not present

## 2016-05-14 DIAGNOSIS — H52203 Unspecified astigmatism, bilateral: Secondary | ICD-10-CM | POA: Diagnosis not present

## 2016-05-14 DIAGNOSIS — H43813 Vitreous degeneration, bilateral: Secondary | ICD-10-CM | POA: Diagnosis not present

## 2016-05-15 ENCOUNTER — Encounter (HOSPITAL_COMMUNITY): Admission: RE | Admit: 2016-05-15 | Payer: Self-pay | Source: Ambulatory Visit

## 2016-05-15 ENCOUNTER — Telehealth (HOSPITAL_COMMUNITY): Payer: Self-pay | Admitting: *Deleted

## 2016-05-18 ENCOUNTER — Encounter (HOSPITAL_COMMUNITY): Payer: Self-pay

## 2016-05-20 ENCOUNTER — Encounter (HOSPITAL_COMMUNITY)
Admission: RE | Admit: 2016-05-20 | Discharge: 2016-05-20 | Disposition: A | Discharge status: Home / Self Care | Payer: Self-pay | Source: Ambulatory Visit | Attending: Cardiology | Admitting: Cardiology

## 2016-05-22 ENCOUNTER — Encounter (HOSPITAL_COMMUNITY)
Admission: RE | Admit: 2016-05-22 | Discharge: 2016-05-22 | Disposition: A | Discharge status: Home / Self Care | Payer: Self-pay | Source: Ambulatory Visit | Attending: Cardiology | Admitting: Cardiology

## 2016-05-22 DIAGNOSIS — Z1212 Encounter for screening for malignant neoplasm of rectum: Secondary | ICD-10-CM | POA: Diagnosis not present

## 2016-05-25 ENCOUNTER — Encounter (HOSPITAL_COMMUNITY)
Admission: RE | Admit: 2016-05-25 | Discharge: 2016-05-25 | Disposition: A | Discharge status: Home / Self Care | Payer: Self-pay | Source: Ambulatory Visit | Attending: Cardiology | Admitting: Cardiology

## 2016-05-27 ENCOUNTER — Encounter (HOSPITAL_COMMUNITY)
Admission: RE | Admit: 2016-05-27 | Discharge: 2016-05-27 | Disposition: A | Discharge status: Home / Self Care | Payer: Self-pay | Source: Ambulatory Visit | Attending: Cardiology | Admitting: Cardiology

## 2016-05-29 ENCOUNTER — Encounter (HOSPITAL_COMMUNITY): Payer: Self-pay

## 2016-06-01 ENCOUNTER — Encounter (HOSPITAL_COMMUNITY)
Admission: RE | Admit: 2016-06-01 | Discharge: 2016-06-01 | Disposition: A | Discharge status: Home / Self Care | Payer: Self-pay | Source: Ambulatory Visit | Attending: Cardiology | Admitting: Cardiology

## 2016-06-01 DIAGNOSIS — I2510 Atherosclerotic heart disease of native coronary artery without angina pectoris: Secondary | ICD-10-CM | POA: Insufficient documentation

## 2016-06-03 ENCOUNTER — Encounter (HOSPITAL_COMMUNITY)
Admission: RE | Admit: 2016-06-03 | Discharge: 2016-06-03 | Disposition: A | Discharge status: Home / Self Care | Payer: Self-pay | Source: Ambulatory Visit | Attending: Cardiology | Admitting: Cardiology

## 2016-06-05 ENCOUNTER — Encounter (HOSPITAL_COMMUNITY)
Admission: RE | Admit: 2016-06-05 | Discharge: 2016-06-05 | Disposition: A | Discharge status: Home / Self Care | Payer: Self-pay | Source: Ambulatory Visit | Attending: Cardiology | Admitting: Cardiology

## 2016-06-05 ENCOUNTER — Encounter: Payer: Self-pay | Admitting: Physician Assistant

## 2016-06-05 ENCOUNTER — Ambulatory Visit (INDEPENDENT_AMBULATORY_CARE_PROVIDER_SITE_OTHER): Payer: Medicare Other | Admitting: Physician Assistant

## 2016-06-05 VITALS — BP 132/68 | HR 52 | Ht 70.5 in | Wt 171.8 lb

## 2016-06-05 DIAGNOSIS — I2510 Atherosclerotic heart disease of native coronary artery without angina pectoris: Secondary | ICD-10-CM | POA: Diagnosis not present

## 2016-06-05 DIAGNOSIS — E785 Hyperlipidemia, unspecified: Secondary | ICD-10-CM

## 2016-06-05 MED ORDER — NITROGLYCERIN 0.4 MG SL SUBL: 0.4000 mg | SUBLINGUAL_TABLET | SUBLINGUAL | Status: AC | PRN

## 2016-06-05 NOTE — Progress Notes (Signed)
Cardiology Office Note   Date:  06/05/2016   ID:  Joseph Roman, DOB 1930/10/02, MRN [id number]  PCP:  [name], MD  Cardiologist:  Dr Martinique - last o.v. 05/2015 Rosaria Ferries, PA-C   Chief Complaint  Patient presents with  . Follow-up    yearly    History of Present Illness: Joseph Roman is a 82 y.o. male with a history of IMI 04/2008 w/ DES CFX, MV 2015 no ischemia, HLD, hypothyroid, GERD.  Joseph Roman presents for Yearly follow-up.  Mr. Belt never gets chest pain. His lipids are followed by Dr. Virgina Jock. He is participating in the maintenance program in cardiac rehabilitation. 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  He exerts himself significantly on a regular basis. He remembers a recent workout at cardiac rehabilitation where he got his heart rate up to 160. He was appropriately short of breath with this, but did not have chest pain or other symptoms. He felt well afterwards and had no problems. Additionally, he had a work project that required him to work long hours, up to 12 hours a day. It was out of town and he was driving to the job as well. He had no problems with this and feels that he did very well.  He denies lower extremity edema, orthopnea or PND. He gets occasional leg cramps at night and wonders if this is from the stat. He has not had them during the day. He does not know if he was sweating and an unusual way on the days where he had the leg cramps. He generally tries to eat a heart healthy diet and feels that he does pretty well with this. He has not had significant weight changes.  He is compliant with his medications, but wonders if he can come off any of it.   Past Medical History  Diagnosis Date  . Acute inferolateral myocardial infarction ([place])     04/2008 2.75x23 Promus Lcx  . Coronary artery disease   . Hypothyroidism   . Dyslipidemia     Mild dyslipidemia  . GERD (gastroesophageal reflux disease)      Past Surgical History  Procedure Laterality Date  . Cardiac catheterization  05/07/2008    Ejection fraction was estimated at 60%  . Colonoscopy  04/21/2005    with polypectomy  . Tonsillectomy    . Cataract      Current Outpatient Prescriptions  Medication Sig Dispense Refill  . aspirin 81 MG tablet Take 81 mg by mouth daily.    . clopidogrel (PLAVIX) 75 MG tablet TAKE 1 TABLET (75 MG TOTAL) BY MOUTH ONCE DAILY. PATIENT NEEDS TO CONTACT OFFICE FOR ADDITIONAL REFILLS 30 tablet 11  . levothyroxine (SYNTHROID, LEVOTHROID) 112 MCG tablet Take 112 mcg by mouth daily.    . nitroGLYCERIN (NITROSTAT) 0.4 MG SL tablet Place 0.4 mg under the tongue as needed.    . pravastatin (PRAVACHOL) 10 MG tablet TAKE 1 TABLET (10 MG TOTAL) BY MOUTH DAILY. 30 tablet 11  . vitamin B-12 (CYANOCOBALAMIN) 1000 MCG tablet Take 1,000 mcg by mouth daily.     No current facility-administered medications for this visit.    Allergies:   Other    Social History:  The patient  reports that he has never smoked. He does not have any smokeless tobacco history on file.   Family History:  The patient's family history includes Cancer - Lung in his father.    ROS:  Please see  the history of present illness. All other systems are reviewed and negative.    PHYSICAL EXAM: VS:  BP 132/68 mmHg  Pulse 52  Ht 5' 10.5" (1.791 m)  Wt 171 lb 12.8 oz (77.928 kg)  BMI 24.29 kg/m2 , BMI Body mass index is 24.29 kg/(m^2). GEN: Well nourished, well developed, male in no acute distress HEENT: normal for age  Neck: no JVD, no carotid bruit, no masses Cardiac: RRR; no murmur, no rubs, or gallops Respiratory:  clear to auscultation bilaterally, normal work of breathing GI: soft, nontender, nondistended, + BS MS: no deformity or atrophy; no edema; distal pulses are 2+ in all 4 extremities  Skin: warm and dry, no rash Neuro:  Strength and sensation are intact Psych: euthymic mood, full affect   EKG:  EKG is ordered  today. The ekg ordered today demonstrates sinus bradycardia with sinus arrhythmia, heart rate 52   Recent Labs: No results found for requested labs within last 365 days.    Lipid Panel    Component Value Date/Time   CHOL  05/08/2008 0430    102        ATP III CLASSIFICATION:  <200     mg/dL   Desirable  200-239  mg/dL   Borderline High  >=240    mg/dL   High   TRIG 71 05/08/2008 0430   HDL 21* 05/08/2008 0430   CHOLHDL 4.9 05/08/2008 0430   VLDL 14 05/08/2008 0430   LDLCALC  05/08/2008 0430    67        Total Cholesterol/HDL:CHD Risk Coronary Heart Disease Risk Table                     Men   Women  1/2 Average Risk   3.4   3.3     Wt Readings from Last 3 Encounters:  06/05/16 171 lb 12.8 oz (77.928 kg)  06/20/15 175 lb 3.2 oz (79.47 kg)  05/14/14 176 lb 1.9 oz (79.888 kg)     Other studies Reviewed: Additional studies/ records that were reviewed today include: Office notes and previous records.  ASSESSMENT AND PLAN:  1.  CAD: He is on good medical therapy with aspirin, Plavix, Pravachol and sublingual nitroglycerin when necessary. He is not on a beta blocker because of resting bradycardia.  I advised him that it would be safest to continue his current medications. It has been going on 10 years since he had his heart attack and got a stent. I advised him that I felt he would fit from remaining on his current medical therapy.  2. Hyperlipidemia: His lipids are followed by Dr. Virgina Jock. He is going to try to get [place] copies of those records. A lipid profile that was scanned in the computer from 2016 shows good control  3. Nocturnal leg cramps: These are not frequent. Advised him that if they were more consistent we could consider adjusting his statin to see if that will help. He is to let us know if he gets additional symptoms and we can consider a change in therapy at that time. For now, no dose change.   Current medicines are reviewed at length with the patient today.  The  patient has concerns regarding medicines. Concerns were addressed  The following changes have been made:  no change  Labs/ tests ordered today include: ECG    Disposition:   FU with Dr. Martinique  Signed, Rosaria Ferries, PA-C  06/05/2016 10:10 AM    [facility] Phone: [phone]; Fax: [phone]  This note was written with the assistance of speech recognition software. Please excuse any transcriptional errors.

## 2016-06-05 NOTE — Patient Instructions (Signed)
Medication Instructions:   Your physician recommends that you continue on your current medications as directed. Please refer to the Current Medication list given to you today.   If you need a refill on your cardiac medications before your next appointment, please call your pharmacy.  Labwork: NONE ORDER TODAY    Testing/Procedures:  NONE ORDER TODAY    Follow-Up:  Your physician wants you to follow-up in: ONE YEAR WITH  Martinique  You will receive a reminder letter in the mail two months in advance. If you don't receive a letter, please call our office to schedule the follow-up appointment.     Any Other Special Instructions Will Be Listed Below (If Applicable).

## 2016-06-08 ENCOUNTER — Ambulatory Visit: Payer: Medicare Other | Admitting: Cardiology

## 2016-06-08 ENCOUNTER — Encounter (HOSPITAL_COMMUNITY): Payer: Self-pay

## 2016-06-08 DIAGNOSIS — L821 Other seborrheic keratosis: Secondary | ICD-10-CM | POA: Diagnosis not present

## 2016-06-08 DIAGNOSIS — D225 Melanocytic nevi of trunk: Secondary | ICD-10-CM | POA: Diagnosis not present

## 2016-06-08 DIAGNOSIS — L570 Actinic keratosis: Secondary | ICD-10-CM | POA: Diagnosis not present

## 2016-06-08 DIAGNOSIS — E038 Other specified hypothyroidism: Secondary | ICD-10-CM | POA: Diagnosis not present

## 2016-06-08 DIAGNOSIS — L812 Freckles: Secondary | ICD-10-CM | POA: Diagnosis not present

## 2016-06-08 DIAGNOSIS — D692 Other nonthrombocytopenic purpura: Secondary | ICD-10-CM | POA: Diagnosis not present

## 2016-06-10 ENCOUNTER — Encounter (HOSPITAL_COMMUNITY): Payer: Self-pay

## 2016-06-12 ENCOUNTER — Encounter (HOSPITAL_COMMUNITY)
Admission: RE | Admit: 2016-06-12 | Discharge: 2016-06-12 | Disposition: A | Discharge status: Home / Self Care | Payer: Self-pay | Source: Ambulatory Visit | Attending: Cardiology | Admitting: Cardiology

## 2016-06-15 ENCOUNTER — Encounter (HOSPITAL_COMMUNITY)
Admission: RE | Admit: 2016-06-15 | Discharge: 2016-06-15 | Disposition: A | Discharge status: Home / Self Care | Payer: Self-pay | Source: Ambulatory Visit | Attending: Cardiology | Admitting: Cardiology

## 2016-06-17 ENCOUNTER — Encounter (HOSPITAL_COMMUNITY)
Admission: RE | Admit: 2016-06-17 | Discharge: 2016-06-17 | Disposition: A | Discharge status: Home / Self Care | Payer: Self-pay | Source: Ambulatory Visit | Attending: Cardiology | Admitting: Cardiology

## 2016-06-19 ENCOUNTER — Encounter (HOSPITAL_COMMUNITY)
Admission: RE | Admit: 2016-06-19 | Discharge: 2016-06-19 | Disposition: A | Discharge status: Home / Self Care | Payer: Self-pay | Source: Ambulatory Visit | Attending: Cardiology | Admitting: Cardiology

## 2016-06-22 ENCOUNTER — Encounter (HOSPITAL_COMMUNITY)
Admission: RE | Admit: 2016-06-22 | Discharge: 2016-06-22 | Disposition: A | Discharge status: Home / Self Care | Payer: Self-pay | Source: Ambulatory Visit | Attending: Cardiology | Admitting: Cardiology

## 2016-06-23 ENCOUNTER — Other Ambulatory Visit: Payer: Self-pay | Admitting: Cardiology

## 2016-06-23 NOTE — Telephone Encounter (Signed)
REFILL 

## 2016-06-24 ENCOUNTER — Encounter (HOSPITAL_COMMUNITY)
Admission: RE | Admit: 2016-06-24 | Discharge: 2016-06-24 | Disposition: A | Discharge status: Home / Self Care | Payer: Self-pay | Source: Ambulatory Visit | Attending: Cardiology | Admitting: Cardiology

## 2016-06-26 ENCOUNTER — Encounter (HOSPITAL_COMMUNITY)
Admission: RE | Admit: 2016-06-26 | Discharge: 2016-06-26 | Disposition: A | Discharge status: Home / Self Care | Payer: Self-pay | Source: Ambulatory Visit | Attending: Cardiology | Admitting: Cardiology

## 2016-06-29 ENCOUNTER — Encounter (HOSPITAL_COMMUNITY)
Admission: RE | Admit: 2016-06-29 | Discharge: 2016-06-29 | Disposition: A | Discharge status: Home / Self Care | Payer: Self-pay | Source: Ambulatory Visit | Attending: Cardiology | Admitting: Cardiology

## 2016-07-01 ENCOUNTER — Encounter (HOSPITAL_COMMUNITY)
Admission: RE | Admit: 2016-07-01 | Discharge: 2016-07-01 | Disposition: A | Discharge status: Home / Self Care | Payer: Self-pay | Source: Ambulatory Visit | Attending: Cardiology | Admitting: Cardiology

## 2016-07-01 DIAGNOSIS — I2510 Atherosclerotic heart disease of native coronary artery without angina pectoris: Secondary | ICD-10-CM | POA: Insufficient documentation

## 2016-07-03 ENCOUNTER — Encounter (HOSPITAL_COMMUNITY)
Admission: RE | Admit: 2016-07-03 | Discharge: 2016-07-03 | Disposition: A | Discharge status: Home / Self Care | Payer: Self-pay | Source: Ambulatory Visit | Attending: Cardiology | Admitting: Cardiology

## 2016-07-06 ENCOUNTER — Encounter (HOSPITAL_COMMUNITY)
Admission: RE | Admit: 2016-07-06 | Discharge: 2016-07-06 | Disposition: A | Discharge status: Home / Self Care | Payer: Self-pay | Source: Ambulatory Visit | Attending: Cardiology | Admitting: Cardiology

## 2016-07-08 ENCOUNTER — Encounter (HOSPITAL_COMMUNITY)
Admission: RE | Admit: 2016-07-08 | Discharge: 2016-07-08 | Disposition: A | Discharge status: Home / Self Care | Payer: Self-pay | Source: Ambulatory Visit | Attending: Cardiology | Admitting: Cardiology

## 2016-07-10 ENCOUNTER — Encounter (HOSPITAL_COMMUNITY)
Admission: RE | Admit: 2016-07-10 | Discharge: 2016-07-10 | Disposition: A | Discharge status: Home / Self Care | Payer: Self-pay | Source: Ambulatory Visit | Attending: Cardiology | Admitting: Cardiology

## 2016-07-13 ENCOUNTER — Encounter (HOSPITAL_COMMUNITY)
Admission: RE | Admit: 2016-07-13 | Discharge: 2016-07-13 | Disposition: A | Discharge status: Home / Self Care | Payer: Self-pay | Source: Ambulatory Visit | Attending: Cardiology | Admitting: Cardiology

## 2016-07-15 ENCOUNTER — Encounter (HOSPITAL_COMMUNITY): Payer: Self-pay

## 2016-07-17 ENCOUNTER — Encounter (HOSPITAL_COMMUNITY): Payer: Self-pay

## 2016-07-20 ENCOUNTER — Encounter (HOSPITAL_COMMUNITY)
Admission: RE | Admit: 2016-07-20 | Discharge: 2016-07-20 | Disposition: A | Discharge status: Home / Self Care | Payer: Self-pay | Source: Ambulatory Visit | Attending: Cardiology | Admitting: Cardiology

## 2016-07-22 ENCOUNTER — Encounter (HOSPITAL_COMMUNITY): Payer: Self-pay

## 2016-07-24 ENCOUNTER — Encounter (HOSPITAL_COMMUNITY): Payer: Self-pay

## 2016-07-27 ENCOUNTER — Encounter (HOSPITAL_COMMUNITY): Payer: Self-pay

## 2016-07-29 ENCOUNTER — Encounter (HOSPITAL_COMMUNITY): Payer: Self-pay

## 2016-07-31 ENCOUNTER — Encounter (HOSPITAL_COMMUNITY): Payer: Self-pay

## 2016-07-31 DIAGNOSIS — I2510 Atherosclerotic heart disease of native coronary artery without angina pectoris: Secondary | ICD-10-CM | POA: Insufficient documentation

## 2016-08-05 ENCOUNTER — Encounter (HOSPITAL_COMMUNITY): Payer: Self-pay

## 2016-08-07 ENCOUNTER — Encounter (HOSPITAL_COMMUNITY): Payer: Self-pay

## 2016-08-10 ENCOUNTER — Encounter (HOSPITAL_COMMUNITY): Payer: Self-pay

## 2016-08-12 ENCOUNTER — Encounter (HOSPITAL_COMMUNITY): Payer: Self-pay

## 2016-08-14 ENCOUNTER — Encounter (HOSPITAL_COMMUNITY)
Admission: RE | Admit: 2016-08-14 | Discharge: 2016-08-14 | Disposition: A | Discharge status: Home / Self Care | Payer: Self-pay | Source: Ambulatory Visit | Attending: Cardiology | Admitting: Cardiology

## 2016-08-17 ENCOUNTER — Encounter (HOSPITAL_COMMUNITY): Payer: Self-pay

## 2016-08-17 DIAGNOSIS — E038 Other specified hypothyroidism: Secondary | ICD-10-CM | POA: Diagnosis not present

## 2016-08-19 ENCOUNTER — Encounter (HOSPITAL_COMMUNITY)
Admission: RE | Admit: 2016-08-19 | Discharge: 2016-08-19 | Disposition: A | Discharge status: Home / Self Care | Payer: Self-pay | Source: Ambulatory Visit | Attending: Cardiology | Admitting: Cardiology

## 2016-08-21 ENCOUNTER — Encounter (HOSPITAL_COMMUNITY)
Admission: RE | Admit: 2016-08-21 | Discharge: 2016-08-21 | Disposition: A | Discharge status: Home / Self Care | Payer: Self-pay | Source: Ambulatory Visit | Attending: Cardiology | Admitting: Cardiology

## 2016-08-24 ENCOUNTER — Encounter (HOSPITAL_COMMUNITY)
Admission: RE | Admit: 2016-08-24 | Discharge: 2016-08-24 | Disposition: A | Discharge status: Home / Self Care | Payer: Self-pay | Source: Ambulatory Visit | Attending: Cardiology | Admitting: Cardiology

## 2016-08-26 ENCOUNTER — Encounter (HOSPITAL_COMMUNITY)
Admission: RE | Admit: 2016-08-26 | Discharge: 2016-08-26 | Disposition: A | Discharge status: Home / Self Care | Payer: Self-pay | Source: Ambulatory Visit | Attending: Cardiology | Admitting: Cardiology

## 2016-08-28 ENCOUNTER — Encounter (HOSPITAL_COMMUNITY)
Admission: RE | Admit: 2016-08-28 | Discharge: 2016-08-28 | Disposition: A | Discharge status: Home / Self Care | Payer: Self-pay | Source: Ambulatory Visit | Attending: Cardiology | Admitting: Cardiology

## 2016-08-29 DIAGNOSIS — Z23 Encounter for immunization: Secondary | ICD-10-CM | POA: Diagnosis not present

## 2016-08-31 ENCOUNTER — Encounter (HOSPITAL_COMMUNITY)
Admission: RE | Admit: 2016-08-31 | Discharge: 2016-08-31 | Disposition: A | Discharge status: Home / Self Care | Payer: Self-pay | Source: Ambulatory Visit | Attending: Cardiology | Admitting: Cardiology

## 2016-08-31 DIAGNOSIS — I2510 Atherosclerotic heart disease of native coronary artery without angina pectoris: Secondary | ICD-10-CM | POA: Insufficient documentation

## 2016-09-02 ENCOUNTER — Encounter (HOSPITAL_COMMUNITY): Payer: Self-pay

## 2016-09-04 ENCOUNTER — Encounter (HOSPITAL_COMMUNITY)
Admission: RE | Admit: 2016-09-04 | Discharge: 2016-09-04 | Disposition: A | Discharge status: Home / Self Care | Payer: Self-pay | Source: Ambulatory Visit | Attending: Cardiology | Admitting: Cardiology

## 2016-09-07 ENCOUNTER — Encounter (HOSPITAL_COMMUNITY)
Admission: RE | Admit: 2016-09-07 | Discharge: 2016-09-07 | Disposition: A | Discharge status: Home / Self Care | Payer: Self-pay | Source: Ambulatory Visit | Attending: Cardiology | Admitting: Cardiology

## 2016-09-09 ENCOUNTER — Encounter (HOSPITAL_COMMUNITY)
Admission: RE | Admit: 2016-09-09 | Discharge: 2016-09-09 | Disposition: A | Discharge status: Home / Self Care | Payer: Self-pay | Source: Ambulatory Visit | Attending: Cardiology | Admitting: Cardiology

## 2016-09-11 ENCOUNTER — Encounter (HOSPITAL_COMMUNITY): Payer: Self-pay

## 2016-09-14 ENCOUNTER — Encounter (HOSPITAL_COMMUNITY)
Admission: RE | Admit: 2016-09-14 | Discharge: 2016-09-14 | Disposition: A | Discharge status: Home / Self Care | Payer: Self-pay | Source: Ambulatory Visit | Attending: Cardiology | Admitting: Cardiology

## 2016-09-16 ENCOUNTER — Encounter (HOSPITAL_COMMUNITY): Payer: Self-pay

## 2016-09-18 ENCOUNTER — Encounter (HOSPITAL_COMMUNITY)
Admission: RE | Admit: 2016-09-18 | Discharge: 2016-09-18 | Disposition: A | Discharge status: Home / Self Care | Payer: Self-pay | Source: Ambulatory Visit | Attending: Cardiology | Admitting: Cardiology

## 2016-09-21 ENCOUNTER — Encounter (HOSPITAL_COMMUNITY): Payer: Self-pay

## 2016-09-23 ENCOUNTER — Encounter (HOSPITAL_COMMUNITY)
Admission: RE | Admit: 2016-09-23 | Discharge: 2016-09-23 | Disposition: A | Discharge status: Home / Self Care | Payer: Self-pay | Source: Ambulatory Visit | Attending: Cardiology | Admitting: Cardiology

## 2016-09-25 ENCOUNTER — Encounter (HOSPITAL_COMMUNITY): Payer: Self-pay

## 2016-09-28 ENCOUNTER — Encounter (HOSPITAL_COMMUNITY): Admission: RE | Admit: 2016-09-28 | Payer: Self-pay | Source: Ambulatory Visit

## 2016-09-28 ENCOUNTER — Telehealth (HOSPITAL_COMMUNITY): Payer: Self-pay | Admitting: *Deleted

## 2016-10-05 ENCOUNTER — Encounter (HOSPITAL_COMMUNITY)
Admission: RE | Admit: 2016-10-05 | Discharge: 2016-10-05 | Disposition: A | Discharge status: Home / Self Care | Payer: Self-pay | Source: Ambulatory Visit | Attending: Cardiology | Admitting: Cardiology

## 2016-10-05 DIAGNOSIS — I2510 Atherosclerotic heart disease of native coronary artery without angina pectoris: Secondary | ICD-10-CM | POA: Insufficient documentation

## 2016-10-07 ENCOUNTER — Encounter (HOSPITAL_COMMUNITY)
Admission: RE | Admit: 2016-10-07 | Discharge: 2016-10-07 | Disposition: A | Discharge status: Home / Self Care | Payer: Self-pay | Source: Ambulatory Visit | Attending: Cardiology | Admitting: Cardiology

## 2016-10-12 ENCOUNTER — Encounter (HOSPITAL_COMMUNITY)
Admission: RE | Admit: 2016-10-12 | Discharge: 2016-10-12 | Disposition: A | Discharge status: Home / Self Care | Payer: Self-pay | Source: Ambulatory Visit | Attending: Cardiology | Admitting: Cardiology

## 2016-10-14 ENCOUNTER — Ambulatory Visit (HOSPITAL_COMMUNITY): Payer: Self-pay

## 2016-10-16 ENCOUNTER — Encounter (HOSPITAL_COMMUNITY)
Admission: RE | Admit: 2016-10-16 | Discharge: 2016-10-16 | Disposition: A | Discharge status: Home / Self Care | Payer: Self-pay | Source: Ambulatory Visit | Attending: Cardiology | Admitting: Cardiology

## 2016-10-19 ENCOUNTER — Encounter (HOSPITAL_COMMUNITY)
Admission: RE | Admit: 2016-10-19 | Discharge: 2016-10-19 | Disposition: A | Discharge status: Home / Self Care | Payer: Self-pay | Source: Ambulatory Visit | Attending: Cardiology | Admitting: Cardiology

## 2016-10-21 ENCOUNTER — Encounter (HOSPITAL_COMMUNITY)
Admission: RE | Admit: 2016-10-21 | Discharge: 2016-10-21 | Disposition: A | Discharge status: Home / Self Care | Payer: Self-pay | Source: Ambulatory Visit | Attending: Cardiology | Admitting: Cardiology

## 2016-10-26 ENCOUNTER — Encounter (HOSPITAL_COMMUNITY)
Admission: RE | Admit: 2016-10-26 | Discharge: 2016-10-26 | Disposition: A | Discharge status: Home / Self Care | Payer: Self-pay | Source: Ambulatory Visit | Attending: Cardiology | Admitting: Cardiology

## 2016-10-28 ENCOUNTER — Encounter (HOSPITAL_COMMUNITY)
Admission: RE | Admit: 2016-10-28 | Discharge: 2016-10-28 | Disposition: A | Discharge status: Home / Self Care | Payer: Self-pay | Source: Ambulatory Visit | Attending: Cardiology | Admitting: Cardiology

## 2016-10-30 ENCOUNTER — Encounter (HOSPITAL_COMMUNITY)
Admission: RE | Admit: 2016-10-30 | Discharge: 2016-10-30 | Disposition: A | Discharge status: Home / Self Care | Payer: Self-pay | Source: Ambulatory Visit | Attending: Cardiology | Admitting: Cardiology

## 2016-10-30 DIAGNOSIS — I2510 Atherosclerotic heart disease of native coronary artery without angina pectoris: Secondary | ICD-10-CM | POA: Insufficient documentation

## 2016-11-02 ENCOUNTER — Encounter (HOSPITAL_COMMUNITY): Payer: Self-pay

## 2016-11-04 ENCOUNTER — Encounter (HOSPITAL_COMMUNITY)
Admission: RE | Admit: 2016-11-04 | Discharge: 2016-11-04 | Disposition: A | Discharge status: Home / Self Care | Payer: Self-pay | Source: Ambulatory Visit | Attending: Cardiology | Admitting: Cardiology

## 2016-11-06 ENCOUNTER — Encounter (HOSPITAL_COMMUNITY): Payer: Self-pay

## 2016-11-09 ENCOUNTER — Encounter (HOSPITAL_COMMUNITY): Payer: Self-pay

## 2016-11-11 ENCOUNTER — Encounter (HOSPITAL_COMMUNITY)
Admission: RE | Admit: 2016-11-11 | Discharge: 2016-11-11 | Disposition: A | Discharge status: Home / Self Care | Payer: Self-pay | Source: Ambulatory Visit | Attending: Cardiology | Admitting: Cardiology

## 2016-11-13 ENCOUNTER — Encounter (HOSPITAL_COMMUNITY)
Admission: RE | Admit: 2016-11-13 | Discharge: 2016-11-13 | Disposition: A | Discharge status: Home / Self Care | Payer: Self-pay | Source: Ambulatory Visit | Attending: Cardiology | Admitting: Cardiology

## 2016-11-16 ENCOUNTER — Encounter (HOSPITAL_COMMUNITY): Payer: Self-pay

## 2016-11-18 ENCOUNTER — Encounter (HOSPITAL_COMMUNITY)
Admission: RE | Admit: 2016-11-18 | Discharge: 2016-11-18 | Disposition: A | Discharge status: Home / Self Care | Payer: Self-pay | Source: Ambulatory Visit | Attending: Cardiology | Admitting: Cardiology

## 2016-11-20 ENCOUNTER — Encounter (HOSPITAL_COMMUNITY)
Admission: RE | Admit: 2016-11-20 | Discharge: 2016-11-20 | Disposition: A | Discharge status: Home / Self Care | Payer: Self-pay | Source: Ambulatory Visit | Attending: Cardiology | Admitting: Cardiology

## 2016-11-25 ENCOUNTER — Encounter (HOSPITAL_COMMUNITY): Payer: Self-pay

## 2016-11-27 ENCOUNTER — Encounter (HOSPITAL_COMMUNITY)
Admission: RE | Admit: 2016-11-27 | Discharge: 2016-11-27 | Disposition: A | Discharge status: Home / Self Care | Payer: Self-pay | Source: Ambulatory Visit | Attending: Cardiology | Admitting: Cardiology

## 2016-12-02 ENCOUNTER — Encounter (HOSPITAL_COMMUNITY): Payer: Self-pay

## 2016-12-02 DIAGNOSIS — I2510 Atherosclerotic heart disease of native coronary artery without angina pectoris: Secondary | ICD-10-CM | POA: Insufficient documentation

## 2016-12-04 ENCOUNTER — Encounter (HOSPITAL_COMMUNITY): Payer: Self-pay

## 2016-12-07 ENCOUNTER — Encounter (HOSPITAL_COMMUNITY): Payer: Self-pay

## 2016-12-09 ENCOUNTER — Encounter (HOSPITAL_COMMUNITY): Payer: Self-pay

## 2016-12-11 ENCOUNTER — Encounter (HOSPITAL_COMMUNITY)
Admission: RE | Admit: 2016-12-11 | Discharge: 2016-12-11 | Disposition: A | Discharge status: Home / Self Care | Payer: Self-pay | Source: Ambulatory Visit | Attending: Cardiology | Admitting: Cardiology

## 2016-12-14 ENCOUNTER — Encounter (HOSPITAL_COMMUNITY)
Admission: RE | Admit: 2016-12-14 | Discharge: 2016-12-14 | Disposition: A | Discharge status: Home / Self Care | Payer: Self-pay | Source: Ambulatory Visit | Attending: Cardiology | Admitting: Cardiology

## 2016-12-16 ENCOUNTER — Encounter (HOSPITAL_COMMUNITY): Payer: Self-pay

## 2016-12-18 ENCOUNTER — Encounter (HOSPITAL_COMMUNITY): Payer: Self-pay

## 2016-12-18 DIAGNOSIS — R509 Fever, unspecified: Secondary | ICD-10-CM | POA: Diagnosis not present

## 2016-12-18 DIAGNOSIS — J111 Influenza due to unidentified influenza virus with other respiratory manifestations: Secondary | ICD-10-CM | POA: Diagnosis not present

## 2016-12-21 ENCOUNTER — Encounter (HOSPITAL_COMMUNITY): Payer: Self-pay

## 2016-12-23 ENCOUNTER — Encounter (HOSPITAL_COMMUNITY): Payer: Self-pay

## 2016-12-25 ENCOUNTER — Encounter (HOSPITAL_COMMUNITY): Payer: Self-pay

## 2016-12-25 DIAGNOSIS — J189 Pneumonia, unspecified organism: Secondary | ICD-10-CM | POA: Diagnosis not present

## 2016-12-25 DIAGNOSIS — J029 Acute pharyngitis, unspecified: Secondary | ICD-10-CM | POA: Diagnosis not present

## 2016-12-25 DIAGNOSIS — J111 Influenza due to unidentified influenza virus with other respiratory manifestations: Secondary | ICD-10-CM | POA: Diagnosis not present

## 2016-12-25 DIAGNOSIS — R05 Cough: Secondary | ICD-10-CM | POA: Diagnosis not present

## 2016-12-28 ENCOUNTER — Encounter (HOSPITAL_COMMUNITY): Payer: Self-pay

## 2016-12-28 ENCOUNTER — Inpatient Hospital Stay (HOSPITAL_COMMUNITY)
Admission: EM | Admit: 2016-12-28 | Discharge: 2017-01-04 | DRG: 853 | Disposition: A | Discharge status: Home / Self Care | Payer: Medicare Other | Attending: Surgery | Admitting: Surgery

## 2016-12-28 ENCOUNTER — Emergency Department (HOSPITAL_COMMUNITY): Payer: Medicare Other

## 2016-12-28 DIAGNOSIS — J9601 Acute respiratory failure with hypoxia: Secondary | ICD-10-CM | POA: Diagnosis not present

## 2016-12-28 DIAGNOSIS — D72829 Elevated white blood cell count, unspecified: Secondary | ICD-10-CM | POA: Diagnosis present

## 2016-12-28 DIAGNOSIS — D696 Thrombocytopenia, unspecified: Secondary | ICD-10-CM | POA: Diagnosis present

## 2016-12-28 DIAGNOSIS — K219 Gastro-esophageal reflux disease without esophagitis: Secondary | ICD-10-CM | POA: Diagnosis not present

## 2016-12-28 DIAGNOSIS — J181 Lobar pneumonia, unspecified organism: Secondary | ICD-10-CM | POA: Diagnosis not present

## 2016-12-28 DIAGNOSIS — E785 Hyperlipidemia, unspecified: Secondary | ICD-10-CM | POA: Diagnosis present

## 2016-12-28 DIAGNOSIS — R0602 Shortness of breath: Secondary | ICD-10-CM | POA: Diagnosis not present

## 2016-12-28 DIAGNOSIS — J9819 Other pulmonary collapse: Secondary | ICD-10-CM | POA: Diagnosis present

## 2016-12-28 DIAGNOSIS — Z7982 Long term (current) use of aspirin: Secondary | ICD-10-CM

## 2016-12-28 DIAGNOSIS — J869 Pyothorax without fistula: Secondary | ICD-10-CM | POA: Diagnosis not present

## 2016-12-28 DIAGNOSIS — R079 Chest pain, unspecified: Secondary | ICD-10-CM | POA: Diagnosis not present

## 2016-12-28 DIAGNOSIS — D75839 Thrombocytosis, unspecified: Secondary | ICD-10-CM | POA: Diagnosis present

## 2016-12-28 DIAGNOSIS — Z79899 Other long term (current) drug therapy: Secondary | ICD-10-CM

## 2016-12-28 DIAGNOSIS — J90 Pleural effusion, not elsewhere classified: Secondary | ICD-10-CM

## 2016-12-28 DIAGNOSIS — Z7902 Long term (current) use of antithrombotics/antiplatelets: Secondary | ICD-10-CM

## 2016-12-28 DIAGNOSIS — K5900 Constipation, unspecified: Secondary | ICD-10-CM | POA: Diagnosis not present

## 2016-12-28 DIAGNOSIS — A419 Sepsis, unspecified organism: Secondary | ICD-10-CM | POA: Diagnosis not present

## 2016-12-28 DIAGNOSIS — I2510 Atherosclerotic heart disease of native coronary artery without angina pectoris: Secondary | ICD-10-CM | POA: Diagnosis present

## 2016-12-28 DIAGNOSIS — Z9689 Presence of other specified functional implants: Secondary | ICD-10-CM

## 2016-12-28 DIAGNOSIS — Z9889 Other specified postprocedural states: Secondary | ICD-10-CM

## 2016-12-28 DIAGNOSIS — D473 Essential (hemorrhagic) thrombocythemia: Secondary | ICD-10-CM | POA: Diagnosis present

## 2016-12-28 DIAGNOSIS — I700 Atherosclerosis of aorta: Secondary | ICD-10-CM | POA: Diagnosis present

## 2016-12-28 DIAGNOSIS — J189 Pneumonia, unspecified organism: Secondary | ICD-10-CM | POA: Diagnosis not present

## 2016-12-28 DIAGNOSIS — I252 Old myocardial infarction: Secondary | ICD-10-CM

## 2016-12-28 DIAGNOSIS — B954 Other streptococcus as the cause of diseases classified elsewhere: Secondary | ICD-10-CM | POA: Diagnosis present

## 2016-12-28 DIAGNOSIS — Z955 Presence of coronary angioplasty implant and graft: Secondary | ICD-10-CM

## 2016-12-28 DIAGNOSIS — E039 Hypothyroidism, unspecified: Secondary | ICD-10-CM | POA: Diagnosis present

## 2016-12-28 HISTORY — DX: Unspecified osteoarthritis, unspecified site: M19.90

## 2016-12-28 HISTORY — DX: Pneumonia, unspecified organism: J18.9

## 2016-12-28 NOTE — ED Provider Notes (Signed)
[facility] DEPT Provider Note   CSN: [id number] Arrival date & time: 12/28/16  2309   By signing my name below, I, Eunice Blase, attest that this documentation has been prepared under the direction and in the presence of Everlene Balls, MD. Electronically signed, Eunice Blase, ED Scribe. 12/28/16. 12:03 AM.   History   Chief Complaint Chief Complaint  Patient presents with  . Chest Pain   The history is provided by the patient and medical records. No language interpreter was used.    HPI Comments: Joseph Roman is a 83 y.o. male BIB EMS who presents to the Emergency Department complaining of sharp, left sided chest pain onset ~3-4 hours ago. He states his pain is currently a dull 2/10. Hx of MI in 2009, cadiac catheterization, CAD, GERD and dyslipidemia noted. He states he was sitting down reading at the onset of his symptoms, and he states his chest pain was non local, feeling different from his past reported MI. Pt given 2 nitroglycerine and 324 mg of ASA en route via EMS with adequate relief to his chest pain.  Pt also diagnosed with PNA on 12/25/2016 via XR after presenting with congestion in the lower left lung and cough. Pt on levoquin currently to treat PNA. Pt denies fever, vomiting or diaphoresis.  Past Medical History:  Diagnosis Date  . Acute inferolateral myocardial infarction ([place])    04/2008 2.75x23 Promus Lcx  . Coronary artery disease   . Dyslipidemia    Mild dyslipidemia  . GERD (gastroesophageal reflux disease)   . Hypothyroidism     Patient Active Problem List   Diagnosis Date Noted  . Coronary artery disease   . Dyslipidemia     Past Surgical History:  Procedure Laterality Date  . CARDIAC CATHETERIZATION  05/07/2008   Ejection fraction was estimated at 60%  . cataract    . COLONOSCOPY  04/21/2005   with polypectomy  . TONSILLECTOMY         Home Medications    Prior to Admission medications   Medication Sig Start Date End Date Taking?  Authorizing Provider  aspirin 81 MG tablet Take 81 mg by mouth daily.    Historical Provider, MD  clopidogrel (PLAVIX) 75 MG tablet Take 1 tablet (75 mg total) by mouth daily. 06/23/16   Peter M Martinique, MD  levothyroxine (SYNTHROID, LEVOTHROID) 112 MCG tablet Take 112 mcg by mouth daily.    Historical Provider, MD  nitroGLYCERIN (NITROSTAT) 0.4 MG SL tablet Place 1 tablet (0.4 mg total) under the tongue as needed. 06/05/16   Rhonda G Barrett, PA-C  pravastatin (PRAVACHOL) 10 MG tablet TAKE 1 TABLET (10 MG TOTAL) BY MOUTH DAILY. 03/20/16   Peter M Martinique, MD  vitamin B-12 (CYANOCOBALAMIN) 1000 MCG tablet Take 1,000 mcg by mouth daily.    Historical Provider, MD    Family History Family History  Problem Relation Age of Onset  . Cancer - Lung Father     Social History Social History  Substance Use Topics  . Smoking status: Never Smoker  . Smokeless tobacco: Never Used  . Alcohol use Yes     Comment: 1 beer on weekends     Allergies   Other   Review of Systems Review of Systems  All other systems reviewed and are negative.  A complete 10 system review of systems was obtained and all systems are negative except as noted in the HPI and PMH.    Physical Exam Updated Vital Signs BP 129/65  Pulse 78   Temp 98.6 F (37 C) (Oral)   Resp 16   SpO2 93%   Physical Exam  Constitutional: He is oriented to person, place, and time. Vital signs are normal. He appears well-developed and well-nourished.  Non-toxic appearance. He does not appear ill. No distress.  HENT:  Head: Normocephalic and atraumatic.  Nose: Nose normal.  Mouth/Throat: Oropharynx is clear and moist. No oropharyngeal exudate.  Eyes: Conjunctivae and EOM are normal. Pupils are equal, round, and reactive to light. No scleral icterus.  Neck: Normal range of motion. Neck supple. No tracheal deviation, no edema, no erythema and normal range of motion present. No thyroid mass and no thyromegaly present.  Cardiovascular:  Normal rate, regular rhythm, S1 normal, S2 normal, normal heart sounds, intact distal pulses and normal pulses.  Exam reveals no gallop and no friction rub.   No murmur heard. Pulmonary/Chest: No respiratory distress. He has decreased breath sounds in the left lower field. He has no wheezes. He has no rhonchi. He has no rales.  Nasal canula in place. Trace bilateral lower extremity edema.  Abdominal: Soft. Normal appearance and bowel sounds are normal. He exhibits no distension, no ascites and no mass. There is no hepatosplenomegaly. There is no tenderness. There is no rebound, no guarding and no CVA tenderness.  Musculoskeletal: Normal range of motion. He exhibits no edema or tenderness.  Lymphadenopathy:    He has no cervical adenopathy.  Neurological: He is alert and oriented to person, place, and time. He has normal strength. No cranial nerve deficit or sensory deficit.  Skin: Skin is warm, dry and intact. No petechiae and no rash noted. He is not diaphoretic. No erythema. No pallor.  Nursing note and vitals reviewed.    ED Treatments / Results  DIAGNOSTIC STUDIES: Oxygen Saturation is 93% on RA, low by my interpretation.    COORDINATION OF CARE: 11:49 PM Discussed treatment plan with pt at bedside and pt agreed to plan. Will check labs and reassess.  Labs (all labs ordered are listed, but only abnormal results are displayed) Labs Reviewed  CULTURE, BLOOD (ROUTINE X 2)  CULTURE, BLOOD (ROUTINE X 2)  CBC WITH DIFFERENTIAL/PLATELET  COMPREHENSIVE METABOLIC PANEL  I-STAT TROPOININ, ED    EKG  EKG Interpretation  Date/Time:  Monday December 28 2016 23:10:29 EST Ventricular Rate:  90 PR Interval:    QRS Duration: 87 QT Interval:  345 QTC Calculation: 423 R Axis:   66 Text Interpretation:  Sinus rhythm Low voltage, extremity leads No significant change since last tracing Confirmed by Glynn Octave [phone]) on 12/28/2016 11:15:03 PM       Radiology Dg Chest 2  View  Result Date: 12/28/2016 CLINICAL DATA:  Dyspnea and left-sided chest pain tonight. EXAM: CHEST  2 VIEW COMPARISON:  05/08/2008 FINDINGS: Dense consolidation and effusion in the left base, consistent with pneumonia. The right lung is clear. The pulmonary vasculature is normal. Heart size is normal. IMPRESSION: Left base consolidation and left pleural effusion, likely pneumonia. Followup PA and lateral chest X-ray is recommended in 3-4 weeks following trial of antibiotic therapy to ensure resolution and exclude underlying malignancy. Electronically Signed   By: Andreas Newport M.D.   On: 12/28/2016 23:43    Procedures Procedures (including critical care time)  Medications Ordered in ED Medications  vancomycin (VANCOCIN) 1,500 mg in sodium chloride 0.9 % 500 mL IVPB (not administered)  ceFEPIme (MAXIPIME) 2 g in dextrose 5 % 50 mL IVPB (not administered)  Initial Impression / Assessment and Plan / ED Course  I have reviewed the triage vital signs and the nursing notes.  Pertinent labs & imaging results that were available during my care of the patient were reviewed by me and considered in my medical decision making (see chart for details).     Patient presents to the ED for worsening CP.  He is concerned for ACS however his story is atypical.  CXR reveals LLL PNA with effusion.  He has been on lveoquin for 4 days without obvious improvement.  Will admit for IV abx.  He is also currently hypoxic on RA to 90%, this is not normal for him, he does not wear O2 at home.  1:29 AM CXR shows PNA in the LLL with effusion. He has failed outpt therapy.  Troponin and EKG are neg.  Patient accepted by Dr. Tamala Julian to tele for further care.  Blood cx drawn.  I gave him vanc and cefepime.   I personally performed the services described in this documentation, which was scribed in my presence. The recorded information has been reviewed and is accurate.     Final Clinical Impressions(s) / ED  Diagnoses   Final diagnoses:  None    New Prescriptions New Prescriptions   No medications on file     Everlene Balls, MD 12/29/16 0131

## 2016-12-28 NOTE — ED Triage Notes (Signed)
Pt brought in by [facility]. Pt c/o sharp left sided chest pain starting at 2045 this evening. Pt given 2 nitro and and 324 of ASA with relief. Pt had recent diagnosis of PNA and on anitbiotics. Hx of MI in 2009.

## 2016-12-29 ENCOUNTER — Encounter (HOSPITAL_COMMUNITY): Payer: Self-pay | Admitting: Radiology

## 2016-12-29 ENCOUNTER — Inpatient Hospital Stay (HOSPITAL_COMMUNITY): Payer: Medicare Other

## 2016-12-29 DIAGNOSIS — R0602 Shortness of breath: Secondary | ICD-10-CM | POA: Diagnosis not present

## 2016-12-29 DIAGNOSIS — Z955 Presence of coronary angioplasty implant and graft: Secondary | ICD-10-CM | POA: Diagnosis not present

## 2016-12-29 DIAGNOSIS — E039 Hypothyroidism, unspecified: Secondary | ICD-10-CM | POA: Diagnosis not present

## 2016-12-29 DIAGNOSIS — K5900 Constipation, unspecified: Secondary | ICD-10-CM | POA: Diagnosis not present

## 2016-12-29 DIAGNOSIS — J189 Pneumonia, unspecified organism: Secondary | ICD-10-CM | POA: Insufficient documentation

## 2016-12-29 DIAGNOSIS — D75839 Thrombocytosis, unspecified: Secondary | ICD-10-CM | POA: Diagnosis present

## 2016-12-29 DIAGNOSIS — R0902 Hypoxemia: Secondary | ICD-10-CM | POA: Diagnosis not present

## 2016-12-29 DIAGNOSIS — R091 Pleurisy: Secondary | ICD-10-CM | POA: Diagnosis not present

## 2016-12-29 DIAGNOSIS — J9811 Atelectasis: Secondary | ICD-10-CM | POA: Diagnosis not present

## 2016-12-29 DIAGNOSIS — J869 Pyothorax without fistula: Secondary | ICD-10-CM | POA: Diagnosis not present

## 2016-12-29 DIAGNOSIS — I2510 Atherosclerotic heart disease of native coronary artery without angina pectoris: Secondary | ICD-10-CM | POA: Diagnosis not present

## 2016-12-29 DIAGNOSIS — E785 Hyperlipidemia, unspecified: Secondary | ICD-10-CM | POA: Diagnosis not present

## 2016-12-29 DIAGNOSIS — Z4682 Encounter for fitting and adjustment of non-vascular catheter: Secondary | ICD-10-CM | POA: Diagnosis not present

## 2016-12-29 DIAGNOSIS — J181 Lobar pneumonia, unspecified organism: Secondary | ICD-10-CM

## 2016-12-29 DIAGNOSIS — Z79899 Other long term (current) drug therapy: Secondary | ICD-10-CM | POA: Diagnosis not present

## 2016-12-29 DIAGNOSIS — D696 Thrombocytopenia, unspecified: Secondary | ICD-10-CM | POA: Diagnosis present

## 2016-12-29 DIAGNOSIS — D473 Essential (hemorrhagic) thrombocythemia: Secondary | ICD-10-CM

## 2016-12-29 DIAGNOSIS — J90 Pleural effusion, not elsewhere classified: Secondary | ICD-10-CM

## 2016-12-29 DIAGNOSIS — D72829 Elevated white blood cell count, unspecified: Secondary | ICD-10-CM | POA: Diagnosis not present

## 2016-12-29 DIAGNOSIS — Z7902 Long term (current) use of antithrombotics/antiplatelets: Secondary | ICD-10-CM | POA: Diagnosis not present

## 2016-12-29 DIAGNOSIS — A419 Sepsis, unspecified organism: Secondary | ICD-10-CM | POA: Diagnosis not present

## 2016-12-29 DIAGNOSIS — I252 Old myocardial infarction: Secondary | ICD-10-CM | POA: Diagnosis not present

## 2016-12-29 DIAGNOSIS — J439 Emphysema, unspecified: Secondary | ICD-10-CM | POA: Diagnosis not present

## 2016-12-29 DIAGNOSIS — Z7982 Long term (current) use of aspirin: Secondary | ICD-10-CM | POA: Diagnosis not present

## 2016-12-29 DIAGNOSIS — J9819 Other pulmonary collapse: Secondary | ICD-10-CM | POA: Diagnosis not present

## 2016-12-29 DIAGNOSIS — J9601 Acute respiratory failure with hypoxia: Secondary | ICD-10-CM | POA: Diagnosis not present

## 2016-12-29 DIAGNOSIS — B954 Other streptococcus as the cause of diseases classified elsewhere: Secondary | ICD-10-CM | POA: Diagnosis present

## 2016-12-29 DIAGNOSIS — K219 Gastro-esophageal reflux disease without esophagitis: Secondary | ICD-10-CM | POA: Diagnosis not present

## 2016-12-29 DIAGNOSIS — R079 Chest pain, unspecified: Secondary | ICD-10-CM | POA: Diagnosis present

## 2016-12-29 HISTORY — DX: Pneumonia, unspecified organism: J18.9

## 2016-12-29 LAB — CBC WITH DIFFERENTIAL/PLATELET
BASOS PCT: 0 %
Basophils Absolute: 0 10*3/uL (ref 0.0–0.1)
EOS ABS: 0.1 10*3/uL (ref 0.0–0.7)
EOS PCT: 0 %
HCT: 37.1 % — ABNORMAL LOW (ref 39.0–52.0)
HEMOGLOBIN: 12 g/dL — AB (ref 13.0–17.0)
Lymphocytes Relative: 6 %
Lymphs Abs: 1.3 10*3/uL (ref 0.7–4.0)
MCH: 32.3 pg (ref 26.0–34.0)
MCHC: 32.3 g/dL (ref 30.0–36.0)
MCV: 100 fL (ref 78.0–100.0)
Monocytes Absolute: 0.7 10*3/uL (ref 0.1–1.0)
Monocytes Relative: 3 %
NEUTROS PCT: 91 %
Neutro Abs: 20.1 10*3/uL — ABNORMAL HIGH (ref 1.7–7.7)
PLATELETS: 541 10*3/uL — AB (ref 150–400)
RBC: 3.71 MIL/uL — AB (ref 4.22–5.81)
RDW: 13 % (ref 11.5–15.5)
WBC: 22.3 10*3/uL — AB (ref 4.0–10.5)

## 2016-12-29 LAB — LACTIC ACID, PLASMA: Lactic Acid, Venous: 1.4 mmol/L (ref 0.5–1.9)

## 2016-12-29 LAB — D-DIMER, QUANTITATIVE: D-Dimer, Quant: 1.66 ug/mL-FEU — ABNORMAL HIGH (ref 0.00–0.50)

## 2016-12-29 LAB — COMPREHENSIVE METABOLIC PANEL
ALBUMIN: 2.9 g/dL — AB (ref 3.5–5.0)
ALK PHOS: 55 U/L (ref 38–126)
ALT: 25 U/L (ref 17–63)
ANION GAP: 11 (ref 5–15)
AST: 22 U/L (ref 15–41)
BUN: 14 mg/dL (ref 6–20)
CALCIUM: 8.3 mg/dL — AB (ref 8.9–10.3)
CHLORIDE: 103 mmol/L (ref 101–111)
CO2: 22 mmol/L (ref 22–32)
Creatinine, Ser: 1.09 mg/dL (ref 0.61–1.24)
GFR calc non Af Amer: 60 mL/min — ABNORMAL LOW (ref >60)
GLUCOSE: 158 mg/dL — AB (ref 65–99)
Potassium: 4.2 mmol/L (ref 3.5–5.1)
SODIUM: 136 mmol/L (ref 135–145)
Total Bilirubin: 0.6 mg/dL (ref 0.3–1.2)
Total Protein: 6.4 g/dL — ABNORMAL LOW (ref 6.5–8.1)

## 2016-12-29 LAB — PROTIME-INR
INR: 1.24
Prothrombin Time: 15.7 seconds — ABNORMAL HIGH (ref 11.4–15.2)

## 2016-12-29 LAB — BRAIN NATRIURETIC PEPTIDE: B Natriuretic Peptide: 126.5 pg/mL — ABNORMAL HIGH (ref 0.0–100.0)

## 2016-12-29 LAB — TROPONIN I

## 2016-12-29 LAB — APTT: aPTT: 38 seconds — ABNORMAL HIGH (ref 24–36)

## 2016-12-29 LAB — I-STAT TROPONIN, ED: Troponin i, poc: 0 ng/mL (ref 0.00–0.08)

## 2016-12-29 MED ORDER — ACETAMINOPHEN 325 MG PO TABS
650.0000 mg | ORAL_TABLET | ORAL | Status: DC | PRN
  Administered 2016-12-30: 650 mg via ORAL
  Filled 2016-12-29: qty 2

## 2016-12-29 MED ORDER — VITAMIN B-12 100 MCG PO TABS
1000.0000 ug | ORAL_TABLET | Freq: Every day | ORAL | Status: DC
  Administered 2016-12-29 – 2016-12-30 (×2): 1000 ug via ORAL
  Filled 2016-12-29: qty 10
  Filled 2016-12-29 (×2): qty 1

## 2016-12-29 MED ORDER — ONDANSETRON HCL 4 MG/2ML IJ SOLN: 4.0000 mg | Freq: Four times a day (QID) | INTRAMUSCULAR | Status: DC | PRN

## 2016-12-29 MED ORDER — CEFEPIME HCL 2 G IJ SOLR
2.0000 g | Freq: Two times a day (BID) | INTRAMUSCULAR | Status: DC
  Administered 2016-12-29 – 2016-12-31 (×5): 2 g via INTRAVENOUS
  Filled 2016-12-29 (×7): qty 2

## 2016-12-29 MED ORDER — IPRATROPIUM-ALBUTEROL 0.5-2.5 (3) MG/3ML IN SOLN: 3.0000 mL | RESPIRATORY_TRACT | Status: DC | PRN

## 2016-12-29 MED ORDER — LEVOTHYROXINE SODIUM 112 MCG PO TABS
112.0000 ug | ORAL_TABLET | Freq: Every day | ORAL | Status: DC
  Administered 2016-12-29 – 2017-01-04 (×6): 112 ug via ORAL
  Filled 2016-12-29 (×6): qty 1

## 2016-12-29 MED ORDER — NITROGLYCERIN 0.4 MG SL SUBL: 0.4000 mg | SUBLINGUAL_TABLET | SUBLINGUAL | Status: DC | PRN

## 2016-12-29 MED ORDER — VANCOMYCIN HCL 10 G IV SOLR
1500.0000 mg | Freq: Once | INTRAVENOUS | Status: AC
  Administered 2016-12-29: 1500 mg via INTRAVENOUS
  Filled 2016-12-29: qty 1500

## 2016-12-29 MED ORDER — DEXTROSE 5 % IV SOLN
2.0000 g | Freq: Once | INTRAVENOUS | Status: AC
  Administered 2016-12-29: 2 g via INTRAVENOUS
  Filled 2016-12-29: qty 2

## 2016-12-29 MED ORDER — MORPHINE SULFATE (PF) 4 MG/ML IV SOLN: 2.0000 mg | INTRAVENOUS | Status: DC | PRN

## 2016-12-29 MED ORDER — KETOROLAC TROMETHAMINE 15 MG/ML IJ SOLN: 15.0000 mg | Freq: Four times a day (QID) | INTRAMUSCULAR | Status: DC | PRN

## 2016-12-29 MED ORDER — GI COCKTAIL ~~LOC~~
30.0000 mL | Freq: Four times a day (QID) | ORAL | Status: DC | PRN
  Filled 2016-12-29: qty 30

## 2016-12-29 MED ORDER — PRAVASTATIN SODIUM 20 MG PO TABS
10.0000 mg | ORAL_TABLET | Freq: Every day | ORAL | Status: DC
  Administered 2016-12-29 – 2017-01-03 (×5): 10 mg via ORAL
  Filled 2016-12-29 (×6): qty 1

## 2016-12-29 MED ORDER — GUAIFENESIN ER 600 MG PO TB12
600.0000 mg | ORAL_TABLET | Freq: Two times a day (BID) | ORAL | Status: DC
  Administered 2016-12-29 – 2016-12-30 (×4): 600 mg via ORAL
  Filled 2016-12-29 (×5): qty 1

## 2016-12-29 MED ORDER — IOPAMIDOL (ISOVUE-370) INJECTION 76%
INTRAVENOUS | Status: AC
  Administered 2016-12-29: 100 mL
  Filled 2016-12-29: qty 100

## 2016-12-29 MED ORDER — CLOPIDOGREL BISULFATE 75 MG PO TABS
75.0000 mg | ORAL_TABLET | Freq: Every day | ORAL | Status: DC
  Administered 2016-12-29: 75 mg via ORAL
  Filled 2016-12-29: qty 1

## 2016-12-29 MED ORDER — ASPIRIN 81 MG PO CHEW
81.0000 mg | CHEWABLE_TABLET | Freq: Every day | ORAL | Status: DC
  Administered 2016-12-29: 81 mg via ORAL
  Filled 2016-12-29 (×2): qty 1

## 2016-12-29 MED ORDER — VANCOMYCIN HCL IN DEXTROSE 750-5 MG/150ML-% IV SOLN
750.0000 mg | Freq: Two times a day (BID) | INTRAVENOUS | Status: DC
  Administered 2016-12-29 – 2016-12-31 (×4): 750 mg via INTRAVENOUS
  Filled 2016-12-29 (×7): qty 150

## 2016-12-29 MED ORDER — ENOXAPARIN SODIUM 40 MG/0.4ML ~~LOC~~ SOLN
40.0000 mg | SUBCUTANEOUS | Status: DC
  Filled 2016-12-29: qty 0.4

## 2016-12-29 NOTE — H&P (Addendum)
History and Physical    Joseph Roman [id number] DOB: 05-22-1930 DOA: 12/28/2016  Referring MD/NP/PA: Dr. Claudine Mouton PCP: [name], MD  Patient coming from: Home via EMS  Chief Complaint: Left-sided chest pain  HPI: Joseph Roman is a 83 y.o. male with medical history significant of CAD s/p PCI, HLD, hypothyroidism, and GERD; who presents with complaints of left-sided chest pain. Symptoms started last night around 8:45 PM while sitting down reading. Patient reports the pain was sharp and constant. Coughing or deep inspiratory breaths make symptoms worse. Initially thought symptoms were possibly heart related so he took a nitroglycerin and 4 baby aspirin with moderate improvement in symptoms.   11 days prior to today he had gone to his PCP for complaints of flulike symptoms. He was checked for the flu and states that 1 test came back positive and 1 test came back negative, but was given Tamiflu. Reports completing the full course of Tamiflu. He followed up in the office 4 days ago due to persistent cough and fever. A chest x-ray was obtained and revealed pneumonia for which the patient was started on Levaquin and has taken for the last 4 days. Since being started on the Levaquin patient denies any fever, nausea, diarrhea, sweats, headache, muscle aches, lower extremity swelling, dysuria, or palpitations. Patient denies any significant history of tobacco or illicit drug use. He reports that he drinks occasionally on the weekends.  ED Course: En route with EMS patient was given an additional nitroglycerin tablet. On admission into the emergency department the patient was noted to be afebrile, pulse 74-78, respirations 16-28, blood pressure maintain , and O2 saturation 90% on room air. Lab work revealed WBC 22.3, hemoglobin 12, platelets 541, glucose 158, troponin 0.0, and all other labs within normal limits. Chest x-ray showed showed a left lower lung base consolidation with pleural effusion.  He was initially started on vancomycin and cefepime.   Review of Systems: As per HPI otherwise 10 point review of systems negative.   Past Medical History:  Diagnosis Date  . Acute inferolateral myocardial infarction ([place])    04/2008 2.75x23 Promus Lcx  . Coronary artery disease   . Dyslipidemia    Mild dyslipidemia  . GERD (gastroesophageal reflux disease)   . Hypothyroidism     Past Surgical History:  Procedure Laterality Date  . CARDIAC CATHETERIZATION  05/07/2008   Ejection fraction was estimated at 60%  . cataract    . COLONOSCOPY  04/21/2005   with polypectomy  . TONSILLECTOMY       reports that he has never smoked. He has never used smokeless tobacco. He reports that he drinks alcohol. He reports that he does not use drugs.  Allergies  Allergen Reactions  . Other     Beta blockers-bradycardia    Family History  Problem Relation Age of Onset  . Cancer - Lung Father     Prior to Admission medications   Medication Sig Start Date End Date Taking? Authorizing Provider  aspirin 81 MG tablet Take 81 mg by mouth daily.   Yes Historical Provider, MD  clopidogrel (PLAVIX) 75 MG tablet Take 1 tablet (75 mg total) by mouth daily. 06/23/16  Yes Peter M Martinique, MD  levofloxacin (LEVAQUIN) 500 MG tablet Take 500 mg by mouth daily. 12/25/16  Yes Historical Provider, MD  levothyroxine (SYNTHROID, LEVOTHROID) 112 MCG tablet Take 112 mcg by mouth daily.   Yes Historical Provider, MD  nitroGLYCERIN (NITROSTAT) 0.4 MG SL tablet Place 1 tablet (0.4  mg total) under the tongue as needed. 06/05/16  Yes Rhonda G Barrett, PA-C  pravastatin (PRAVACHOL) 10 MG tablet TAKE 1 TABLET (10 MG TOTAL) BY MOUTH DAILY. 03/20/16  Yes Peter M Martinique, MD  vitamin B-12 (CYANOCOBALAMIN) 1000 MCG tablet Take 1,000 mcg by mouth daily.   Yes Historical Provider, MD    Physical Exam:   Constitutional: Elderly male in NAD, calm, comfortable Vitals:   12/29/16 0000 12/29/16 0015 12/29/16 0030 12/29/16 0056    BP: 124/66 127/68 129/65   Pulse: 74 77 78   Resp: 23 (!) 28 16   Temp:    98.6 F (37 C)  TempSrc:    Oral  SpO2: 94% 94% 93%    Eyes: PERRL, lids and conjunctivae normal ENMT: Mucous membranes are moist. Posterior pharynx clear of any exudate or lesions.  Neck: normal, supple, no masses, no thyromegaly Respiratory: Normal respiratory effort with crackles and decreased aeration noted on the left mid to lower lung field. No wheezes or rhonchi appreciated Cardiovascular: Regular rate and rhythm, no murmurs / rubs / gallops. Trace lower extremity edema. 2+ pedal pulses. No carotid bruits.  Abdomen: no tenderness, no masses palpated. No hepatosplenomegaly. Bowel sounds positive.  Musculoskeletal: no clubbing / cyanosis. No joint deformity upper and lower extremities. Good ROM, no contractures. Normal muscle tone.  Skin: no rashes, lesions, ulcers. No induration Neurologic: CN 2-12 grossly intact. Sensation intact, DTR normal. Strength 5/5 in all 4.  Psychiatric: Normal judgment and insight. Alert and oriented x 3. Normal mood.     Labs on Admission: I have personally reviewed following labs and imaging studies  CBC:  Recent Labs Lab 12/29/16 0000  WBC 22.3*  NEUTROABS 20.1*  HGB 12.0*  HCT 37.1*  MCV 100.0  PLT [id number]*   Basic Metabolic Panel:  Recent Labs Lab 12/29/16 0000  NA 136  K 4.2  CL 103  CO2 22  GLUCOSE 158*  BUN 14  CREATININE 1.09  CALCIUM 8.3*   GFR: CrCl cannot be calculated (Unknown ideal weight.). Liver Function Tests:  Recent Labs Lab 12/29/16 0000  AST 22  ALT 25  ALKPHOS 55  BILITOT 0.6  PROT 6.4*  ALBUMIN 2.9*   No results for input(s): LIPASE, AMYLASE in the last 168 hours. No results for input(s): AMMONIA in the last 168 hours. Coagulation Profile: No results for input(s): INR, PROTIME in the last 168 hours. Cardiac Enzymes: No results for input(s): CKTOTAL, CKMB, CKMBINDEX, TROPONINI in the last 168 hours. BNP (last 3  results) No results for input(s): PROBNP in the last 8760 hours. HbA1C: No results for input(s): HGBA1C in the last 72 hours. CBG: No results for input(s): GLUCAP in the last 168 hours. Lipid Profile: No results for input(s): CHOL, HDL, LDLCALC, TRIG, CHOLHDL, LDLDIRECT in the last 72 hours. Thyroid Function Tests: No results for input(s): TSH, T4TOTAL, FREET4, T3FREE, THYROIDAB in the last 72 hours. Anemia Panel: No results for input(s): VITAMINB12, FOLATE, FERRITIN, TIBC, IRON, RETICCTPCT in the last 72 hours. Urine analysis: No results found for: COLORURINE, APPEARANCEUR, LABSPEC, PHURINE, GLUCOSEU, HGBUR, BILIRUBINUR, KETONESUR, PROTEINUR, UROBILINOGEN, NITRITE, LEUKOCYTESUR Sepsis Labs: No results found for this or any previous visit (from the past 240 hour(s)).   Radiological Exams on Admission: Dg Chest 2 View  Result Date: 12/28/2016 CLINICAL DATA:  Dyspnea and left-sided chest pain tonight. EXAM: CHEST  2 VIEW COMPARISON:  05/08/2008 FINDINGS: Dense consolidation and effusion in the left base, consistent with pneumonia. The right lung is clear. The pulmonary vasculature  is normal. Heart size is normal. IMPRESSION: Left base consolidation and left pleural effusion, likely pneumonia. Followup PA and lateral chest X-ray is recommended in 3-4 weeks following trial of antibiotic therapy to ensure resolution and exclude underlying malignancy. Electronically Signed   By: Andreas Newport M.D.   On: 12/28/2016 23:43    EKG: Independently reviewed. Sinus rhythm  Assessment/Plan Sepsis 2/2 CAP: Acute. She initially tried on Levaquin in the outpatient setting as well as noted to have been previously possibly diagnosed with the flu. Question possibility of post influenza bacterial pneumonia. - Admit to a telemetry bed - Sepsis protocol initiated without fluid bolus - Continue antibiotics of vancomycin and cefepime - Follow up blood and sputum cultures - Mucinex  - Duonebs prn  SOB/Wheezing - May warrant CT scan of the chest - Follow-up BNP  Leukocytosis WBC elevated at 22.3 on admission. - Follow-up repeat CBC in a.m.   Chest pain: Acute. Relieved with nitroglycerin and aspirin.  - Trend cardiac troponin  CAD s/p PCI - Continue Plavix and aspirin  Hyperlipidemia - Continue pravastatin  Hypothyroidism - Continue levothyroxine  Thrombocytosis: Patient initial platelet count elevated at 541. Suspect that this could be reactive.  - Continue to monitor   DVT prophylaxis: Lovenox    Code Status: Full  Family Communication: No family present  at bedside Disposition Plan:  Likely discharge home once medically stable Consults called: None Admission status: Inpatient Norval Morton MD [facility] Pager [phone]  If 7PM-7AM, please contact night-coverage [email or web address] Password [email or web address]  12/29/2016, 1:17 AM

## 2016-12-29 NOTE — Progress Notes (Signed)
PROGRESS NOTE                                                                                                                                                                                                             Patient Demographics:    Joseph Roman, is a 83 y.o. male, DOB - May 22, 1930, [id number]  Admit date - 12/28/2016   Admitting Physician Norval Morton, MD  Outpatient Primary MD for the patient is [name], MD  LOS - 0  Outpatient Specialists:  Chief Complaint  Patient presents with  . Chest Pain       Brief Narrative   83 year old male with history of CAD status post PCI, hypothyroidism, hyperlipidemia and GERD presented with left-sided chest pain of one-day duration. In the past 2 weeks he saw his PCP for flulike symptoms and was treated with 5 day course of Tamiflu, however the flu test was negative. He then saw his PCP 4 days prior to admission with persistent cough and fever at home. A chest x-ray done showed pneumonia and will was started on Levaquin. However symptoms did not improve. He took nitroglycerin and 4 baby aspirin concerned about his chest pain and then called EMS. In the ED he was afebrile but tachypneic and O2 sat of 90% on room air. Blood work showed significant leukocytosis (WBC of 22K), elevated platelets, normal EKG and negative troponin. Chest x-ray showed left lower lobe consolidation with effusion. Blood cultures sent and received empiric vancomycin and Zosyn. He had mildly elevated d-dimer so a CT angiogram of the chest was done which was negative for PE but showed a large layering left lower lobe effusion with near complete collapse of the left lower lobe and part of left upper lobe.     Subjective:   Patient reports feeling slightly better but still has some left lateral chest pain on deep inspiration.   Assessment  & Plan :    Principal Problem:   Sepsis  ([place]) Secondary to left lobar pneumonia with effusion. CT chest finding as above with large layering left pleural effusion with near complete collapse of the left lower lobe. Possibly parapneumonic effusion. Consulted PCCM for further evaluation. Empiric vancomycin and Zosyn. Follow blood cultures, strep pneumonia antigen. Supportive care with Tylenol and antitussives.  Active Problems:  Chest pain Secondary to pleurisy. Supportive care  with Tylenol and when necessary Toradol.  CAD with history of PCI Continue aspirin and Plavix. Continue statin  Hypothyroidism  continue Synthroid.       Code Status : Full code  Family Communication  : Spoke with daughter on the phone  Disposition Plan  : Home once improved  Barriers For Discharge : Active symptoms  Consults  :  PC CM  Procedures  :  CT angiogram of the chest  DVT Prophylaxis  :  Lovenox -   Lab Results  Component Value Date   PLT 541 (H) 12/29/2016    Antibiotics  :    Anti-infectives    Start     Dose/Rate Route Frequency Ordered Stop   12/29/16 1200  vancomycin (VANCOCIN) IVPB 750 mg/150 ml premix     750 mg 150 mL/hr over 60 Minutes Intravenous Every 12 hours 12/29/16 0247     12/29/16 1000  ceFEPIme (MAXIPIME) 2 g in dextrose 5 % 50 mL IVPB     2 g 100 mL/hr over 30 Minutes Intravenous Every 12 hours 12/29/16 0247     12/29/16 0000  vancomycin (VANCOCIN) 1,500 mg in sodium chloride 0.9 % 500 mL IVPB     1,500 mg 250 mL/hr over 120 Minutes Intravenous  Once 12/29/16 0000 12/29/16 0252   12/29/16 0000  ceFEPIme (MAXIPIME) 2 g in dextrose 5 % 50 mL IVPB     2 g 100 mL/hr over 30 Minutes Intravenous  Once 12/29/16 0000 12/29/16 0057        Objective:   Vitals:   12/29/16 0927 12/29/16 0945 12/29/16 1030 12/29/16 1100  BP: 129/73 130/66 128/61 123/68  Pulse: 77 81 73 77  Resp: 18 (!) 29 26   Temp:      TempSrc:      SpO2: 94% 91% 93% 94%  Weight:      Height:        Wt Readings from Last 3  Encounters:  12/29/16 77.9 kg (171 lb 11.8 oz)  06/05/16 77.9 kg (171 lb 12.8 oz)  06/20/15 79.5 kg (175 lb 3.2 oz)     Intake/Output Summary (Last 24 hours) at 12/29/16 1130 Last data filed at 12/29/16 0252  Gross per 24 hour  Intake              550 ml  Output                0 ml  Net              550 ml     Physical Exam  Gen: not in distress HEENT:moist mucosa, supple neck Chest: Diminished breath sounds over left lung CVS: N S1&S2, no murmurs, rubs or gallop GI: soft, NT, ND,  Musculoskeletal: warm, no edema     Data Review:    CBC  Recent Labs Lab 12/29/16 0000  WBC 22.3*  HGB 12.0*  HCT 37.1*  PLT 541*  MCV 100.0  MCH 32.3  MCHC 32.3  RDW 13.0  LYMPHSABS 1.3  MONOABS 0.7  EOSABS 0.1  BASOSABS 0.0    Chemistries   Recent Labs Lab 12/29/16 0000  NA 136  K 4.2  CL 103  CO2 22  GLUCOSE 158*  BUN 14  CREATININE 1.09  CALCIUM 8.3*  AST 22  ALT 25  ALKPHOS 55  BILITOT 0.6   ------------------------------------------------------------------------------------------------------------------ No results for input(s): CHOL, HDL, LDLCALC, TRIG, CHOLHDL, LDLDIRECT in the last 72 hours.  Lab Results  Component Value Date  HGBA1C  05/07/2008    5.6 (NOTE)   The ADA recommends the following therapeutic goals for glycemic   control related to Hgb A1C measurement:   Goal of Therapy:   < 7.0% Hgb A1C   Action Suggested:  > 8.0% Hgb A1C   Ref:  Diabetes Care, 22, Suppl. 1, 1999   ------------------------------------------------------------------------------------------------------------------ No results for input(s): TSH, T4TOTAL, T3FREE, THYROIDAB in the last 72 hours.  Invalid input(s): FREET3 ------------------------------------------------------------------------------------------------------------------ No results for input(s): VITAMINB12, FOLATE, FERRITIN, TIBC, IRON, RETICCTPCT in the last 72 hours.  Coagulation profile No results for  input(s): INR, PROTIME in the last 168 hours.   Recent Labs  12/29/16 0605  DDIMER 1.66*    Cardiac Enzymes  Recent Labs Lab 12/29/16 0246 12/29/16 0605 12/29/16 0828  TROPONINI <0.03 <0.03 <0.03   ------------------------------------------------------------------------------------------------------------------    Component Value Date/Time   BNP 126.5 (H) 12/29/2016 0828    Inpatient Medications  Scheduled Meds: . aspirin  81 mg Oral Daily  . clopidogrel  75 mg Oral Daily  . enoxaparin (LOVENOX) injection  40 mg Subcutaneous Q24H  . guaiFENesin  600 mg Oral BID  . levothyroxine  112 mcg Oral QAC breakfast  . pravastatin  10 mg Oral q1800  . vancomycin  750 mg Intravenous Q12H  . vitamin B-12  1,000 mcg Oral Daily   Continuous Infusions: . ceFEPime (MAXIPIME) IV 2 g (12/29/16 1059)   PRN Meds:.acetaminophen, gi cocktail, ipratropium-albuterol, morphine injection, nitroGLYCERIN, ondansetron (ZOFRAN) IV  Micro Results No results found for this or any previous visit (from the past 240 hour(s)).  Radiology Reports Dg Chest 2 View  Result Date: 12/28/2016 CLINICAL DATA:  Dyspnea and left-sided chest pain tonight. EXAM: CHEST  2 VIEW COMPARISON:  05/08/2008 FINDINGS: Dense consolidation and effusion in the left base, consistent with pneumonia. The right lung is clear. The pulmonary vasculature is normal. Heart size is normal. IMPRESSION: Left base consolidation and left pleural effusion, likely pneumonia. Followup PA and lateral chest X-ray is recommended in 3-4 weeks following trial of antibiotic therapy to ensure resolution and exclude underlying malignancy. Electronically Signed   By: Andreas Newport M.D.   On: 12/28/2016 23:43   Ct Angio Chest Pe W Or Wo Contrast  Result Date: 12/29/2016 CLINICAL DATA:  Left-sided chest pain and shortness of breath beginning 2 weeks ago. EXAM: CT ANGIOGRAPHY CHEST WITH CONTRAST TECHNIQUE: Multidetector CT imaging of the chest was  performed using the standard protocol during bolus administration of intravenous contrast. Multiplanar CT image reconstructions and MIPs were obtained to evaluate the vascular anatomy. CONTRAST:  100 cc Isovue 370 COMPARISON:  Chest radiography yesterday. FINDINGS: Cardiovascular: Pulmonary arterial opacification is good. There are no pulmonary emboli. Very small amount of pericardial fluid. There is coronary artery atherosclerosis and aortic atherosclerosis. Mediastinum/Nodes: No mediastinal mass or lymphadenopathy. Lungs/Pleura: Large left effusion layering dependently, probably larger than was seen yesterday. Complete collapse of left lower lobe and partial collapse of the left upper lobe. Small dependent effusion on the right with dependent volume loss in the right lower lobe. Upper Abdomen: Negative Musculoskeletal: Ordinary chronic degenerative changes affect the spine. Review of the MIP images confirms the above findings. IMPRESSION: No pulmonary emboli. Large left effusion, probably larger than was seen yesterday. Complete collapse of the left lower lobe and partial collapse of left upper lobe. Small right effusion with dependent volume loss in the right lower lobe. Aortic and coronary artery atherosclerosis. Electronically Signed   By: Nelson Chimes M.D.   On: 12/29/2016  09:34    Time Spent in minutes  25   Louellen Molder M.D on 12/29/2016 at 11:30 AM  Between 7am to 7pm - Pager - [phone]  After 7pm go to [email or web address] - password [facility]  [facility] -  Office  [phone]

## 2016-12-29 NOTE — ED Notes (Signed)
Patient is out of the department at this time

## 2016-12-29 NOTE — ED Notes (Signed)
Patient has returned from being out of the department; patient placed back on monitor, continuous pulse oximetry and blood pressure cuff 

## 2016-12-29 NOTE — Procedures (Addendum)
[place] chest  1. Rt small effusions to none 2. Left effusion moderate to large, atx lung, free flowing, NO loculation  Lavon Paganini. Titus Mould, MD, [place] Pgr: [place] [facility]

## 2016-12-29 NOTE — Progress Notes (Signed)
Pharmacy Antibiotic Note  Joseph Roman is a 83 y.o. male admitted on 12/28/2016 with pneumonia.  Pharmacy has been consulted for vancomycin and cefepime dosing.  Plan: Vanc 1500mg  and cefepime 2g given in ED. Vancomycin 750mg  IV every 12 hours.  Goal trough 15-20 mcg/mL.  Cefepime 2g IV every 12 hours.  Height: 5' 10.5" (179.1 cm) Weight: 171 lb 11.8 oz (77.9 kg) IBW/kg (Calculated) : 74.15  Temp (24hrs), Avg:98.6 F (37 C), Min:98.6 F (37 C), Max:98.6 F (37 C)   Recent Labs Lab 12/29/16 0000  WBC 22.3*  CREATININE 1.09    Estimated Creatinine Clearance: 52 mL/min (by C-G formula based on SCr of 1.09 mg/dL).    Allergies  Allergen Reactions  . Other     Beta blockers-bradycardia     Thank you for allowing pharmacy to be a part of this patient's care.  Wynona Neat, PharmD, BCPS  12/29/2016 2:44 AM

## 2016-12-29 NOTE — Consult Note (Signed)
Name: Joseph Roman MRN: [id number] DOB: [birth year]    ADMISSION DATE:  12/28/2016 CONSULTATION DATE:  1/30  REFERRING MD : [facility]  CHIEF COMPLAINT:  Chest pain  BRIEF PATIENT DESCRIPTION: WNWD male  SIGNIFICANT EVENTS    STUDIES:     HISTORY OF PRESENT ILLNESS:  83 yo  WM, never smoker, active ans still works, hx of CAD with stent 2009 who had flu like sxs 1/15 with fever , dry cough, chills. Seen by PCP 1/17 and treated with tamiflu. Fevers continued and was treated with levaquin and on 1/30 he developed left side chest pain an d came to er. Ct scan revealed large left effusion. NOTE: he fell on ice and hit his left side and is on plavix for CAD. [place] cw possible hematoma. He may have a pneumonia and/or hemothorax. Plavix will be held and thora planned for 1/30  PAST MEDICAL HISTORY :   has a past medical history of Acute inferolateral myocardial infarction [facility]); Coronary artery disease; Dyslipidemia; GERD (gastroesophageal reflux disease); and Hypothyroidism.  has a past surgical history that includes Cardiac catheterization (05/07/2008); Colonoscopy (04/21/2005); Tonsillectomy; and cataract. Prior to Admission medications   Medication Sig Start Date End Date Taking? Authorizing Provider  aspirin 81 MG tablet Take 81 mg by mouth daily.   Yes Historical Provider, MD  clopidogrel (PLAVIX) 75 MG tablet Take 1 tablet (75 mg total) by mouth daily. 06/23/16  Yes Peter M Martinique, MD  levofloxacin (LEVAQUIN) 500 MG tablet Take 500 mg by mouth daily. 12/25/16  Yes Historical Provider, MD  levothyroxine (SYNTHROID, LEVOTHROID) 112 MCG tablet Take 112 mcg by mouth daily.   Yes Historical Provider, MD  nitroGLYCERIN (NITROSTAT) 0.4 MG SL tablet Place 1 tablet (0.4 mg total) under the tongue as needed. 06/05/16  Yes Rhonda G Barrett, PA-C  pravastatin (PRAVACHOL) 10 MG tablet TAKE 1 TABLET (10 MG TOTAL) BY MOUTH DAILY. 03/20/16  Yes Peter M Martinique, MD  vitamin B-12 (CYANOCOBALAMIN) 1000 MCG  tablet Take 1,000 mcg by mouth daily.   Yes Historical Provider, MD   Allergies  Allergen Reactions  . Other     Beta blockers-bradycardia    FAMILY HISTORY:  family history includes Cancer - Lung in his father. SOCIAL HISTORY:  reports that he has never smoked. He has never used smokeless tobacco. He reports that he drinks alcohol. He reports that he does not use drugs.  REVIEW OF SYSTEMS:   10 point review of system taken, please see HPI for positives and negatives.   SUBJECTIVE: NAD at rest  VITAL SIGNS: Temp:  [98.6 F (37 C)] 98.6 F (37 C) (01/30 0056) Pulse Rate:  [61-81] 75 (01/30 1130) Resp:  [16-29] 26 (01/30 1030) BP: (108-131)/(61-90) 113/76 (01/30 1130) SpO2:  [91 %-96 %] 94 % (01/30 1130) Weight:  [171 lb 11.8 oz (77.9 kg)] 171 lb 11.8 oz (77.9 kg) (01/30 0215)  PHYSICAL EXAMINATION: General:  WNWDWM Neuro:  Intact HEENT:  NO jvd/lan Cardiovascular:  HSR RRR  Lungs:  Decreased bs left base, dull to percussion left Abdomen: Soft +bs Musculoskeletal:  intact Skin:  Warm and dry   Recent Labs Lab 12/29/16 0000  NA 136  K 4.2  CL 103  CO2 22  BUN 14  CREATININE 1.09  GLUCOSE 158*    Recent Labs Lab 12/29/16 0000  HGB 12.0*  HCT 37.1*  WBC 22.3*  PLT 541*   Dg Chest 2 View  Result Date: 12/28/2016 CLINICAL DATA:  Dyspnea and left-sided chest  pain tonight. EXAM: CHEST  2 VIEW COMPARISON:  05/08/2008 FINDINGS: Dense consolidation and effusion in the left base, consistent with pneumonia. The right lung is clear. The pulmonary vasculature is normal. Heart size is normal. IMPRESSION: Left base consolidation and left pleural effusion, likely pneumonia. Followup PA and lateral chest X-ray is recommended in 3-4 weeks following trial of antibiotic therapy to ensure resolution and exclude underlying malignancy. Electronically Signed   By: Andreas Newport M.D.   On: 12/28/2016 23:43   Ct Angio Chest Pe W Or Wo Contrast  Result Date:  12/29/2016 CLINICAL DATA:  Left-sided chest pain and shortness of breath beginning 2 weeks ago. EXAM: CT ANGIOGRAPHY CHEST WITH CONTRAST TECHNIQUE: Multidetector CT imaging of the chest was performed using the standard protocol during bolus administration of intravenous contrast. Multiplanar CT image reconstructions and MIPs were obtained to evaluate the vascular anatomy. CONTRAST:  100 cc Isovue 370 COMPARISON:  Chest radiography yesterday. FINDINGS: Cardiovascular: Pulmonary arterial opacification is good. There are no pulmonary emboli. Very small amount of pericardial fluid. There is coronary artery atherosclerosis and aortic atherosclerosis. Mediastinum/Nodes: No mediastinal mass or lymphadenopathy. Lungs/Pleura: Large left effusion layering dependently, probably larger than was seen yesterday. Complete collapse of left lower lobe and partial collapse of the left upper lobe. Small dependent effusion on the right with dependent volume loss in the right lower lobe. Upper Abdomen: Negative Musculoskeletal: Ordinary chronic degenerative changes affect the spine. Review of the MIP images confirms the above findings. IMPRESSION: No pulmonary emboli. Large left effusion, probably larger than was seen yesterday. Complete collapse of the left lower lobe and partial collapse of left upper lobe. Small right effusion with dependent volume loss in the right lower lobe. Aortic and coronary artery atherosclerosis. Electronically Signed   By: Nelson Chimes M.D.   On: 12/29/2016 09:34    ASSESSMENT      Pleural effusion left in setting of CAP   Sepsis [facility])  Coronary artery disease stent 2009   Dyslipidemia   Community acquired pneumonia   Leukocytosis   Hypothyroidism   Thrombocytosis [facility])  Discussion: 83 yo  WM, never smoker, active ans still works, hx of CAD with stent 2009 who had flu like sxs 1/15 with fever , dry cough, chills. Seen by PCP 1/17 and treated with tamiflu. Fevers continued and was treated with  levaquin and on 1/30 he developed left side chest pain an d came to er. Ct scan revealed large left effusion. NOTE: he fell on ice and hit his left side and is on plavix for CAD. [place] cw possible hematoma. He may have a pneumonia and/or hemothorax. Plavix will be held and thora planned for 1/30.    PLAN: Hold plavix US guided thora in am  [facility] ACNP Maryanna Shape PCCM Pager [phone] till 3 pm If no answer page [phone] 12/29/2016, 12:29 PM   STAFF NOTE: I, Merrie Roof, MD FACP have personally reviewed patient's available data, including medical history, events of note, physical examination and test results as part of my evaluation. I have discussed with resident/NP and other care providers such as pharmacist, RN and RRT. In addition, I personally evaluated patient and elicited key findings of: awake, NO distress at all, NO SOB, no increase WOB, not toxic appearing, history reviewed with pt, some fevers, but fell on ice on plavix on left chest 18th shoveling snow, since then felt like he pulled a muscle there, no wt loss, no hemoptysis, he does not apppear infected at all, CT with  large effusion and NON loculations, would covere PNA, but I am concerned about hemothorax with or without recent flu, I performed US which shows free flowing effusion, large - moderate, not loculated, since he is NOT in any distress or symptomatic, and have concerns of hemothorax will HOLD plavix now and thora in am, if he declines overnight we pccm will tap then, updated wife and pt in full.cbc in am   Lavon Paganini. Titus Mould, MD, [place] Pgr: [place] [facility] 12/29/2016 12:57 PM

## 2016-12-30 ENCOUNTER — Inpatient Hospital Stay (HOSPITAL_COMMUNITY): Payer: Medicare Other

## 2016-12-30 ENCOUNTER — Encounter (HOSPITAL_COMMUNITY): Payer: Self-pay

## 2016-12-30 DIAGNOSIS — J9819 Other pulmonary collapse: Secondary | ICD-10-CM

## 2016-12-30 DIAGNOSIS — J869 Pyothorax without fistula: Secondary | ICD-10-CM

## 2016-12-30 DIAGNOSIS — A419 Sepsis, unspecified organism: Principal | ICD-10-CM

## 2016-12-30 DIAGNOSIS — R0902 Hypoxemia: Secondary | ICD-10-CM

## 2016-12-30 DIAGNOSIS — E039 Hypothyroidism, unspecified: Secondary | ICD-10-CM

## 2016-12-30 DIAGNOSIS — I2510 Atherosclerotic heart disease of native coronary artery without angina pectoris: Secondary | ICD-10-CM

## 2016-12-30 DIAGNOSIS — E785 Hyperlipidemia, unspecified: Secondary | ICD-10-CM

## 2016-12-30 LAB — CBC
HEMATOCRIT: 38.8 % — AB (ref 39.0–52.0)
HEMOGLOBIN: 12.6 g/dL — AB (ref 13.0–17.0)
MCH: 32.4 pg (ref 26.0–34.0)
MCHC: 32.5 g/dL (ref 30.0–36.0)
MCV: 99.7 fL (ref 78.0–100.0)
Platelets: 499 10*3/uL — ABNORMAL HIGH (ref 150–400)
RBC: 3.89 MIL/uL — AB (ref 4.22–5.81)
RDW: 13.3 % (ref 11.5–15.5)
WBC: 21.1 10*3/uL — ABNORMAL HIGH (ref 4.0–10.5)

## 2016-12-30 LAB — PROTEIN, BODY FLUID: Total protein, fluid: 4.4 g/dL

## 2016-12-30 LAB — BODY FLUID CELL COUNT WITH DIFFERENTIAL
Eos, Fluid: 0 %
LYMPHS FL: 3 %
MONOCYTE-MACROPHAGE-SEROUS FLUID: 3 % — AB (ref 50–90)
NEUTROPHIL FLUID: 94 % — AB (ref 0–25)
Total Nucleated Cell Count, Fluid: 2499 cu mm — ABNORMAL HIGH (ref 0–1000)

## 2016-12-30 LAB — LACTATE DEHYDROGENASE: LDH: 158 U/L (ref 98–192)

## 2016-12-30 LAB — PROTEIN, TOTAL: Total Protein: 6.5 g/dL (ref 6.5–8.1)

## 2016-12-30 LAB — LACTATE DEHYDROGENASE, PLEURAL OR PERITONEAL FLUID: LD FL: 443 U/L — AB (ref 3–23)

## 2016-12-30 NOTE — Consult Note (Signed)
[address]       [place] [address]             [phone]      Cardiothoracic Surgery Consultation  Reason for Consult: left empyema Referring Physician: Dr. Jonna Clark is an 83 y.o. male.  HPI:   The patient is an active 83 year old gentleman with a history of hyperlipidemia, hypothyroidism, CAD s/p MI and PCI with stenting of the LCX in 04/2008. He has done well without any chest pain or shortness of breath until his recent illness. He went to his PCP a couple weeks ago with flu-like symptoms and was treated with Tamiflu. He completed the course but says that he continued to have fever to 102 associated with cough and shortness of breath. A CXR showed pneumonia and he was started on Levaquin last Friday. He says that on Monday evening he started having some sharp, constant left chest pain associated with coughing which made it worse. He though it might be his heart and took NTG and 4 baby aspirin with some improvement. He called EMS and was given an additional NTG. He was found to have an oxygen sat of 90%, leukocytosis to 22.3, negative troponin and a CXR showing LLL consolidation and pleural effusion. He had a CT scan yesterday which showed a large loculated left pleural effusion with LLL collapse consistent with pneumonia and empyema. He had a thoracentesis today removing 400 cc of cloudy fluid that is an exudate.  Past Medical History:  Diagnosis Date  . Acute inferolateral myocardial infarction ([place]) 04/2008   2.75x23 Promus Lcx  . Arthritis    "comes and goes; hits everywhere" (12/29/2016)  . Coronary artery disease   . Dyslipidemia    Mild dyslipidemia  . GERD (gastroesophageal reflux disease)   . Hypothyroidism   . Pneumonia 12/29/2016    Past Surgical History:  Procedure Laterality Date  . CATARACT EXTRACTION W/ INTRAOCULAR LENS  IMPLANT, BILATERAL Bilateral 2010-2016   "right-left"  . COLONOSCOPY W/ BIOPSIES AND POLYPECTOMY   04/21/2005  . CORONARY ANGIOPLASTY WITH STENT PLACEMENT  05/07/2008   Ejection fraction was estimated at 60%  . TONSILLECTOMY      Family History  Problem Relation Age of Onset  . Cancer - Lung Father     Social History:  reports that he has never smoked. He has never used smokeless tobacco. He reports that he drinks about 0.6 oz of alcohol per week . He reports that he does not use drugs.  Allergies:  Allergies  Allergen Reactions  . Other     Beta blockers-bradycardia    Medications:  I have reviewed the patient's current medications. Prior to Admission:  Prescriptions Prior to Admission  Medication Sig Dispense Refill Last Dose  . aspirin 81 MG tablet Take 81 mg by mouth daily.   12/28/2016 at Unknown time  . clopidogrel (PLAVIX) 75 MG tablet Take 1 tablet (75 mg total) by mouth daily. 30 tablet 11 12/28/2016 at Unknown time  . levofloxacin (LEVAQUIN) 500 MG tablet Take 500 mg by mouth daily.   12/28/2016 at Unknown time  . levothyroxine (SYNTHROID, LEVOTHROID) 112 MCG tablet Take 112 mcg by mouth daily.   12/28/2016 at Unknown time  . nitroGLYCERIN (NITROSTAT) 0.4 MG SL tablet Place 1 tablet (0.4 mg total) under the tongue as needed. 25 tablet 3 12/28/2016 at Unknown time  . pravastatin (PRAVACHOL) 10 MG tablet TAKE 1 TABLET (10 MG TOTAL)  BY MOUTH DAILY. 30 tablet 11 12/28/2016 at Unknown time  . vitamin B-12 (CYANOCOBALAMIN) 1000 MCG tablet Take 1,000 mcg by mouth daily.   12/28/2016 at Unknown time   Scheduled: . aspirin  81 mg Oral Daily  . ceFEPime (MAXIPIME) IV  2 g Intravenous Q12H  . guaiFENesin  600 mg Oral BID  . levothyroxine  112 mcg Oral QAC breakfast  . pravastatin  10 mg Oral q1800  . vancomycin  750 mg Intravenous Q12H  . vitamin B-12  1,000 mcg Oral Daily   Continuous:  BRA:[id number], gi cocktail, ipratropium-albuterol, ketorolac, nitroGLYCERIN, ondansetron (ZOFRAN) IV Anti-infectives    Start     Dose/Rate Route Frequency Ordered Stop   12/29/16 1200   vancomycin (VANCOCIN) IVPB 750 mg/150 ml premix     750 mg 150 mL/hr over 60 Minutes Intravenous Every 12 hours 12/29/16 0247     12/29/16 1000  ceFEPIme (MAXIPIME) 2 g in dextrose 5 % 50 mL IVPB     2 g 100 mL/hr over 30 Minutes Intravenous Every 12 hours 12/29/16 0247     12/29/16 0000  vancomycin (VANCOCIN) 1,500 mg in sodium chloride 0.9 % 500 mL IVPB     1,500 mg 250 mL/hr over 120 Minutes Intravenous  Once 12/29/16 0000 12/29/16 0252   12/29/16 0000  ceFEPIme (MAXIPIME) 2 g in dextrose 5 % 50 mL IVPB     2 g 100 mL/hr over 30 Minutes Intravenous  Once 12/29/16 0000 12/29/16 0057      Results for orders placed or performed during the hospital encounter of 12/28/16 (from the past 48 hour(s))  CBC with Differential/Platelet     Status: Abnormal   Collection Time: 12/29/16 12:00 AM  Result Value Ref Range   WBC 22.3 (H) 4.0 - 10.5 K/uL   RBC 3.71 (L) 4.22 - 5.81 MIL/uL   Hemoglobin 12.0 (L) 13.0 - 17.0 g/dL   HCT 37.1 (L) 39.0 - 52.0 %   MCV 100.0 78.0 - 100.0 fL   MCH 32.3 26.0 - 34.0 pg   MCHC 32.3 30.0 - 36.0 g/dL   RDW 13.0 11.5 - 15.5 %   Platelets 541 (H) 150 - 400 K/uL   Neutrophils Relative % 91 %   Neutro Abs 20.1 (H) 1.7 - 7.7 K/uL   Lymphocytes Relative 6 %   Lymphs Abs 1.3 0.7 - 4.0 K/uL   Monocytes Relative 3 %   Monocytes Absolute 0.7 0.1 - 1.0 K/uL   Eosinophils Relative 0 %   Eosinophils Absolute 0.1 0.0 - 0.7 K/uL   Basophils Relative 0 %   Basophils Absolute 0.0 0.0 - 0.1 K/uL  Comprehensive metabolic panel     Status: Abnormal   Collection Time: 12/29/16 12:00 AM  Result Value Ref Range   Sodium 136 135 - 145 mmol/L   Potassium 4.2 3.5 - 5.1 mmol/L   Chloride 103 101 - 111 mmol/L   CO2 22 22 - 32 mmol/L   Glucose, Bld 158 (H) 65 - 99 mg/dL   BUN 14 6 - 20 mg/dL   Creatinine, Ser 1.09 0.61 - 1.24 mg/dL   Calcium 8.3 (L) 8.9 - 10.3 mg/dL   Total Protein 6.4 (L) 6.5 - 8.1 g/dL   Albumin 2.9 (L) 3.5 - 5.0 g/dL   AST 22 15 - 41 U/L   ALT 25 17 - 63  U/L   Alkaline Phosphatase 55 38 - 126 U/L   Total Bilirubin 0.6 0.3 - 1.2 mg/dL   GFR  calc non Af Amer 60 (L) >60 mL/min   GFR calc Af Amer >60 >60 mL/min    Comment: (NOTE) The eGFR has been calculated using the CKD EPI equation. This calculation has not been validated in all clinical situations. eGFR's persistently <60 mL/min signify possible Chronic Kidney Disease.    Anion gap 11 5 - 15  Culture, blood (Routine X 2) w Reflex to ID Panel     Status: None (Preliminary result)   Collection Time: 12/29/16 12:00 AM  Result Value Ref Range   Specimen Description BLOOD RIGHT ARM    Special Requests BOTTLES DRAWN AEROBIC AND ANAEROBIC 5ML    Culture NO GROWTH 1 DAY    Report Status PENDING   Culture, blood (Routine X 2) w Reflex to ID Panel     Status: None (Preliminary result)   Collection Time: 12/29/16 12:18 AM  Result Value Ref Range   Specimen Description BLOOD RIGHT HAND    Special Requests BOTTLES DRAWN AEROBIC AND ANAEROBIC 5ML    Culture NO GROWTH 1 DAY    Report Status PENDING   I-stat troponin, ED     Status: None   Collection Time: 12/29/16 12:32 AM  Result Value Ref Range   Troponin i, poc 0.00 0.00 - 0.08 ng/mL   Comment 3            Comment: Due to the release kinetics of cTnI, a negative result within the first hours of the onset of symptoms does not rule out myocardial infarction with certainty. If myocardial infarction is still suspected, repeat the test at appropriate intervals.   Troponin I-serum (0, 3, 6 hours)     Status: None   Collection Time: 12/29/16  2:46 AM  Result Value Ref Range   Troponin I <0.03 <0.03 ng/mL  Lactic acid, plasma     Status: None   Collection Time: 12/29/16  2:46 AM  Result Value Ref Range   Lactic Acid, Venous 1.4 0.5 - 1.9 mmol/L  Troponin I-serum (0, 3, 6 hours)     Status: None   Collection Time: 12/29/16  6:05 AM  Result Value Ref Range   Troponin I <0.03 <0.03 ng/mL  D-dimer, quantitative (not at [facility])     Status:  Abnormal   Collection Time: 12/29/16  6:05 AM  Result Value Ref Range   D-Dimer, Quant 1.66 (H) 0.00 - 0.50 ug/mL-FEU    Comment: (NOTE) At the manufacturer cut-off of 0.50 ug/mL FEU, this assay has been documented to exclude PE with a sensitivity and negative predictive value of 97 to 99%.  At this time, this assay has not been approved by the FDA to exclude DVT/VTE. Results should be correlated with clinical presentation.   Troponin I-serum (0, 3, 6 hours)     Status: None   Collection Time: 12/29/16  8:28 AM  Result Value Ref Range   Troponin I <0.03 <0.03 ng/mL  Brain natriuretic peptide     Status: Abnormal   Collection Time: 12/29/16  8:28 AM  Result Value Ref Range   B Natriuretic Peptide 126.5 (H) 0.0 - 100.0 pg/mL  APTT     Status: Abnormal   Collection Time: 12/29/16  2:55 PM  Result Value Ref Range   aPTT 38 (H) 24 - 36 seconds    Comment:        IF BASELINE aPTT IS ELEVATED, SUGGEST PATIENT RISK ASSESSMENT BE USED TO DETERMINE APPROPRIATE ANTICOAGULANT THERAPY.   Protime-INR     Status: Abnormal  Collection Time: 12/29/16  2:55 PM  Result Value Ref Range   Prothrombin Time 15.7 (H) 11.4 - 15.2 seconds   INR 1.24   CBC     Status: Abnormal   Collection Time: 12/30/16  6:48 AM  Result Value Ref Range   WBC 21.1 (H) 4.0 - 10.5 K/uL   RBC 3.89 (L) 4.22 - 5.81 MIL/uL   Hemoglobin 12.6 (L) 13.0 - 17.0 g/dL   HCT 38.8 (L) 39.0 - 52.0 %   MCV 99.7 78.0 - 100.0 fL   MCH 32.4 26.0 - 34.0 pg   MCHC 32.5 30.0 - 36.0 g/dL   RDW 13.3 11.5 - 15.5 %   Platelets 499 (H) 150 - 400 K/uL  Lactate dehydrogenase (CSF, pleural or peritoneal fluid)     Status: Abnormal   Collection Time: 12/30/16 12:02 PM  Result Value Ref Range   LD, Fluid 443 (H) 3 - 23 U/L    Comment: (NOTE) Results should be evaluated in conjunction with serum values    Fluid Type-FLDH FLUID     Comment: LEFT PLEURAL CORRECTED ON 01/31 AT 1227: PREVIOUSLY REPORTED AS Pleural Fld   Protein, pleural  or peritoneal fluid     Status: None   Collection Time: 12/30/16 12:02 PM  Result Value Ref Range   Total protein, fluid 4.4 g/dL    Comment: (NOTE) No normal range established for this test Results should be evaluated in conjunction with serum values    Fluid Type-FTP FLUID     Comment: LEFT PLEURAL CORRECTED ON 01/31 AT 1229: PREVIOUSLY REPORTED AS Pleural Fld   Body fluid cell count with differential     Status: Abnormal   Collection Time: 12/30/16 12:02 PM  Result Value Ref Range   Fluid Type-FCT FLUID     Comment: LEFT PLEURAL    Color, Fluid AMBER (A) YELLOW   Appearance, Fluid CLOUDY (A) CLEAR   WBC, Fluid 2,499 (H) 0 - 1,000 cu mm   Neutrophil Count, Fluid 94 (H) 0 - 25 %   Lymphs, Fluid 3 %   Monocyte-Macrophage-Serous Fluid 3 (L) 50 - 90 %   Eos, Fluid 0 %  Protein, total     Status: None   Collection Time: 12/30/16 12:35 PM  Result Value Ref Range   Total Protein 6.5 6.5 - 8.1 g/dL  Lactate dehydrogenase     Status: None   Collection Time: 12/30/16 12:35 PM  Result Value Ref Range   LDH 158 98 - 192 U/L   *Note: Due to a large number of results and/or encounters for the requested time period, some results have not been displayed. A complete set of results can be found in Results Review.    Dg Chest 2 View  Result Date: 12/28/2016 CLINICAL DATA:  Dyspnea and left-sided chest pain tonight. EXAM: CHEST  2 VIEW COMPARISON:  05/08/2008 FINDINGS: Dense consolidation and effusion in the left base, consistent with pneumonia. The right lung is clear. The pulmonary vasculature is normal. Heart size is normal. IMPRESSION: Left base consolidation and left pleural effusion, likely pneumonia. Followup PA and lateral chest X-ray is recommended in 3-4 weeks following trial of antibiotic therapy to ensure resolution and exclude underlying malignancy. Electronically Signed   By: Andreas Newport M.D.   On: 12/28/2016 23:43   Ct Angio Chest Pe W Or Wo Contrast  Result Date:  12/29/2016 CLINICAL DATA:  Left-sided chest pain and shortness of breath beginning 2 weeks ago. EXAM: CT ANGIOGRAPHY CHEST WITH CONTRAST  TECHNIQUE: Multidetector CT imaging of the chest was performed using the standard protocol during bolus administration of intravenous contrast. Multiplanar CT image reconstructions and MIPs were obtained to evaluate the vascular anatomy. CONTRAST:  100 cc Isovue 370 COMPARISON:  Chest radiography yesterday. FINDINGS: Cardiovascular: Pulmonary arterial opacification is good. There are no pulmonary emboli. Very small amount of pericardial fluid. There is coronary artery atherosclerosis and aortic atherosclerosis. Mediastinum/Nodes: No mediastinal mass or lymphadenopathy. Lungs/Pleura: Large left effusion layering dependently, probably larger than was seen yesterday. Complete collapse of left lower lobe and partial collapse of the left upper lobe. Small dependent effusion on the right with dependent volume loss in the right lower lobe. Upper Abdomen: Negative Musculoskeletal: Ordinary chronic degenerative changes affect the spine. Review of the MIP images confirms the above findings. IMPRESSION: No pulmonary emboli. Large left effusion, probably larger than was seen yesterday. Complete collapse of the left lower lobe and partial collapse of left upper lobe. Small right effusion with dependent volume loss in the right lower lobe. Aortic and coronary artery atherosclerosis. Electronically Signed   By: Nelson Chimes M.D.   On: 12/29/2016 09:34   Dg Chest Port 1 View  Result Date: 12/30/2016 CLINICAL DATA:  83 y/o  M; status post thoracentesis. EXAM: PORTABLE CHEST 1 VIEW COMPARISON:  12/29/2016 CT chest.  12/28/2016 chest radiograph FINDINGS: Large left pleural effusion with left lower lobe and partial left upper lobe compressive atelectasis grossly stable from prior chest CT localizer. Small right pleural effusion. Clear right lung. Cardiac silhouette is obscured by the pleural  effusions. General changes of the spine. No acute osseous abnormality identified. No pneumothorax identified. IMPRESSION: Large left pleural effusion and left lung partial atelectasis is stable from prior CT given differences in technique. Small right pleural effusion. Electronically Signed   By: Kristine Garbe M.D.   On: 12/30/2016 14:37    Review of Systems  Constitutional: Positive for fever and malaise/fatigue. Negative for diaphoresis.  HENT: Negative.   Eyes: Negative.   Respiratory: Positive for cough and shortness of breath. Negative for hemoptysis and sputum production.   Cardiovascular: Positive for chest pain. Negative for orthopnea, leg swelling and PND.  Gastrointestinal: Negative.   Genitourinary: Negative.   Musculoskeletal: Positive for back pain.  Skin: Negative.   Neurological: Negative.   Endo/Heme/Allergies: Negative.   Psychiatric/Behavioral: Negative.    Blood pressure (!) 121/59, pulse 82, temperature 99 F (37.2 C), temperature source Oral, resp. rate 18, height 5' 10.5" (1.791 m), weight 77.9 kg (171 lb 11.8 oz), SpO2 93 %. Physical Exam  Constitutional: He is oriented to person, place, and time. He appears well-nourished. No distress.  HENT:  Head: Normocephalic and atraumatic.  Mouth/Throat: Oropharynx is clear and moist.  Eyes: EOM are normal. Pupils are equal, round, and reactive to light. No scleral icterus.  Neck: Normal range of motion. Neck supple. No JVD present. No thyromegaly present.  Cardiovascular: Normal rate, regular rhythm, normal heart sounds and intact distal pulses.  Exam reveals no friction rub.   No murmur heard. Respiratory: Effort normal. No respiratory distress. He exhibits no tenderness.  Decreased and tubular breath sounds over left lower hemithorax.  GI: Soft. Bowel sounds are normal. He exhibits no distension and no mass. There is no tenderness.  Musculoskeletal: Normal range of motion. He exhibits no edema.    Lymphadenopathy:    He has no cervical adenopathy.  Neurological: He is alert and oriented to person, place, and time. He has normal strength. No cranial nerve deficit  or sensory deficit.  Skin: Skin is warm and dry.  Psychiatric: He has a normal mood and affect.   CLINICAL DATA:  Left-sided chest pain and shortness of breath beginning 2 weeks ago.  EXAM: CT ANGIOGRAPHY CHEST WITH CONTRAST  TECHNIQUE: Multidetector CT imaging of the chest was performed using the standard protocol during bolus administration of intravenous contrast. Multiplanar CT image reconstructions and MIPs were obtained to evaluate the vascular anatomy.  CONTRAST:  100 cc Isovue 370  COMPARISON:  Chest radiography yesterday.  FINDINGS: Cardiovascular: Pulmonary arterial opacification is good. There are no pulmonary emboli. Very small amount of pericardial fluid. There is coronary artery atherosclerosis and aortic atherosclerosis.  Mediastinum/Nodes: No mediastinal mass or lymphadenopathy.  Lungs/Pleura: Large left effusion layering dependently, probably larger than was seen yesterday. Complete collapse of left lower lobe and partial collapse of the left upper lobe. Small dependent effusion on the right with dependent volume loss in the right lower lobe.  Upper Abdomen: Negative  Musculoskeletal: Ordinary chronic degenerative changes affect the spine.  Review of the MIP images confirms the above findings.  IMPRESSION: No pulmonary emboli.  Large left effusion, probably larger than was seen yesterday. Complete collapse of the left lower lobe and partial collapse of left upper lobe.  Small right effusion with dependent volume loss in the right lower lobe.  Aortic and coronary artery atherosclerosis.   Electronically Signed   By: Nelson Chimes M.D.   On: 12/29/2016 09:34   Assessment/Plan:  This 83 year old active gentleman has a large left empyema with collapse of the  LLL of the lung. It is likely that he had flu, then pneumonia followed by parapneumonic effusion. I think this is best treated with left thoracoscopy to allow complete drainage and reexpansion of the left lung. It is possible that this will be loculated and require a thoracotomy but Dr. Titus Mould did an ultrasound yesterday and did not see any loculations. I discussed the procedure of left VATS, possible thoracotomy with the patient including alternatives, benefits and risks including but not limited to bleeding, blood transfusion, infection, air leak, recurrent effusion and he understands and agrees to proceed.  I spent 60 minutes performing this consultation and > 50% of this time was spent face to face counseling and coordinating the care of this patient's left empyema.  Gaye Pollack 12/30/2016, 6:06 PM

## 2016-12-30 NOTE — Procedures (Signed)
Thoracentesis Procedure Note  Pre-operative Diagnosis: Right sided pleural effusion  Post-operative Diagnosis: same  Indications: Right sided pleural effusion  Procedure Details  Consent: Informed consent was obtained. Risks of the procedure were discussed including: infection, bleeding, pain, pneumothorax.  Under sterile conditions the patient was positioned. Betadine solution and sterile drapes were utilized.  1% plain lidocaine was used to anesthetize the 5 rib space. Fluid was obtained without any difficulties and minimal blood loss.  A dressing was applied to the wound and wound care instructions were provided.   Findings 400 ml of cloudy pleural fluid was obtained. A sample was sent to Pathology for cytogenetics, flow, and cell counts, as well as for infection analysis.  Complications:  None; patient tolerated the procedure well.          Condition: stable  Plan A follow up chest x-ray was ordered. Bed Rest for 0 hours. Tylenol 650 mg. for pain.  U/S used during the procedure  Attending Attestation: I was present for the entire procedure.  Rush Farmer, M.D. [facility] Pulmonary/Critical Care Medicine. Pager: [phone]. After hours pager: [phone].

## 2016-12-30 NOTE — Progress Notes (Signed)
[facility]                                                                              Patient Demographics  Joseph Roman, is a 83 y.o. male, DOB - 1930/05/09, [id number]  Admit date - 12/28/2016   Admitting Physician Norval Morton, MD  Outpatient Primary MD for the patient is [name], MD  Outpatient specialists:   LOS - 1  days    Chief Complaint  Patient presents with  . Chest Pain       Brief summary   Brief Narrative   83 year old male with history of CAD status post PCI, hypothyroidism, hyperlipidemia and GERD presented with left-sided chest pain of one-day duration. In the past 2 weeks he saw his PCP for flulike symptoms and was treated with 5 day course of Tamiflu, however the flu test was negative. He then saw his PCP 4 days prior to admission with persistent cough and fever at home. A chest x-ray done showed pneumonia and will was started on Levaquin. However symptoms did not improve. He took nitroglycerin and 4 baby aspirin concerned about his chest pain and then called EMS. In the ED he was afebrile but tachypneic and O2 sat of 90% on room air. Blood work showed significant leukocytosis (WBC of 22K), elevated platelets, normal EKG and negative troponin. Chest x-ray showed left lower lobe consolidation with effusion. Blood cultures sent and received empiric vancomycin and Zosyn. He had mildly elevated d-dimer so a CT angiogram of the chest was done which was negative for PE but showed a large layering left lower lobe effusion with near complete collapse of the left lower lobe and part of left upper lobe.   Assessment & Plan   Principal problem Sepsis ([place]) With HCAP, large left-sided pleural effusion - Secondary to left lobar pneumonia with effusion.  - CT chest finding large layering left pleural effusion with near complete collapse of the left lower lobe, parapneumonic effusion versus hemothorax. - Continue empiric vancomycin and  cefepime - Blood cultures negative so far  Active problems Acute hypoxic respiratory failure - O2 sats 96% on 3 L - will recheck O2 sats at rest and ambulation after thoracentesis - Follow CT chest  Large left pleural effusion, parapneumonic versus traumatic hemothorax - Per PCCM, thoracentesis today and pleural fluid studies with follow-up chest x-ray  Chest pain - Secondary to pleurisy. Supportive care with Tylenol and PRN Toradol.  CAD with history of PCI - Continue aspirin Continue statin - Plavix on hold  Hypothyroidism  continue Synthroid.  Code Status: full  DVT Prophylaxis:  SCD's  Family Communication: Discussed in detail with the patient, all imaging results, lab results explained to the patient   Disposition Plan:   Time Spent in minutes  25 minutes  Procedures:    Consultants:   PCCM  Antimicrobials:   IV vancomycin  IV cefepime   Medications  Scheduled Meds: . aspirin  81 mg Oral Daily  . ceFEPime (MAXIPIME) IV  2 g Intravenous Q12H  . guaiFENesin  600 mg Oral BID  . levothyroxine  112 mcg Oral QAC breakfast  . pravastatin  10 mg Oral q1800  . vancomycin  750 mg Intravenous Q12H  . vitamin B-12  1,000 mcg Oral Daily   Continuous Infusions: PRN Meds:.acetaminophen, gi cocktail, ipratropium-albuterol, ketorolac, nitroGLYCERIN, ondansetron (ZOFRAN) IV   Antibiotics   Anti-infectives    Start     Dose/Rate Route Frequency Ordered Stop   12/29/16 1200  vancomycin (VANCOCIN) IVPB 750 mg/150 ml premix     750 mg 150 mL/hr over 60 Minutes Intravenous Every 12 hours 12/29/16 0247     12/29/16 1000  ceFEPIme (MAXIPIME) 2 g in dextrose 5 % 50 mL IVPB     2 g 100 mL/hr over 30 Minutes Intravenous Every 12 hours 12/29/16 0247     12/29/16 0000  vancomycin (VANCOCIN) 1,500 mg in sodium chloride 0.9 % 500 mL IVPB     1,500 mg 250 mL/hr over 120 Minutes Intravenous  Once 12/29/16 0000 12/29/16 0252   12/29/16 0000  ceFEPIme (MAXIPIME) 2 g in  dextrose 5 % 50 mL IVPB     2 g 100 mL/hr over 30 Minutes Intravenous  Once 12/29/16 0000 12/29/16 0057        Subjective:   Joseph Roman was seen and examined today.  Still complaining of pleuritic chest pain on the left, lower midline.  Patient denies dizziness, abdominal pain, N/V/D/C, new weakness, numbess, tingling. No acute events overnight.    Objective:   Vitals:   12/29/16 2141 12/30/16 0532 12/30/16 0704 12/30/16 1046  BP: (!) 142/59 123/67  119/63  Pulse: 81 79  86  Resp: 18 18    Temp:  100.1 F (37.8 C) 97.7 F (36.5 C)   TempSrc:      SpO2: 90% 94% 94% 96%  Weight:      Height:        Intake/Output Summary (Last 24 hours) at 12/30/16 1209 Last data filed at 12/30/16 0928  Gross per 24 hour  Intake              720 ml  Output              220 ml  Net              500 ml     Wt Readings from Last 3 Encounters:  12/29/16 77.9 kg (171 lb 11.8 oz)  06/05/16 77.9 kg (171 lb 12.8 oz)  06/20/15 79.5 kg (175 lb 3.2 oz)     Exam  General: Alert and oriented x 3, NAD  HEENT:  PERRLA, EOMI, Anicteric Sclera, mucous membranes moist.   Neck:   Cardiovascular: S1 S2 auscultated, no rubs, murmurs or gallops. Regular rate and rhythm.  Respiratory: Decreased breath sounds on the left   Gastrointestinal: Soft, nontender, nondistended, + bowel sounds  Ext: no cyanosis clubbing or edema  Neuro: AAOx3, Cr N's II- XII. Strength 5/5 upper and lower extremities bilaterally  Skin: No rashes  Psych: Normal affect and demeanor, alert and oriented x3    Data Reviewed:  I have personally reviewed following labs and imaging studies  Micro Results Recent Results (from the past 240 hour(s))  Culture, blood (Routine X 2) w Reflex to ID Panel     Status: None (Preliminary result)   Collection Time: 12/29/16 12:00 AM  Result Value Ref Range Status   Specimen Description BLOOD RIGHT ARM  Final   Special Requests BOTTLES DRAWN AEROBIC AND ANAEROBIC 5ML  Final    Culture NO GROWTH < 24 HOURS  Final   Report Status PENDING  Incomplete  Culture, blood (Routine X 2) w Reflex to ID Panel     Status: None (Preliminary result)   Collection Time: 12/29/16 12:18 AM  Result Value Ref Range Status   Specimen Description BLOOD RIGHT HAND  Final   Special Requests BOTTLES DRAWN AEROBIC AND ANAEROBIC 5ML  Final   Culture NO GROWTH < 24 HOURS  Final   Report Status PENDING  Incomplete    Radiology Reports Dg Chest 2 View  Result Date: 12/28/2016 CLINICAL DATA:  Dyspnea and left-sided chest pain tonight. EXAM: CHEST  2 VIEW COMPARISON:  05/08/2008 FINDINGS: Dense consolidation and effusion in the left base, consistent with pneumonia. The right lung is clear. The pulmonary vasculature is normal. Heart size is normal. IMPRESSION: Left base consolidation and left pleural effusion, likely pneumonia. Followup PA and lateral chest X-ray is recommended in 3-4 weeks following trial of antibiotic therapy to ensure resolution and exclude underlying malignancy. Electronically Signed   By: Andreas Newport M.D.   On: 12/28/2016 23:43   Ct Angio Chest Pe W Or Wo Contrast  Result Date: 12/29/2016 CLINICAL DATA:  Left-sided chest pain and shortness of breath beginning 2 weeks ago. EXAM: CT ANGIOGRAPHY CHEST WITH CONTRAST TECHNIQUE: Multidetector CT imaging of the chest was performed using the standard protocol during bolus administration of intravenous contrast. Multiplanar CT image reconstructions and MIPs were obtained to evaluate the vascular anatomy. CONTRAST:  100 cc Isovue 370 COMPARISON:  Chest radiography yesterday. FINDINGS: Cardiovascular: Pulmonary arterial opacification is good. There are no pulmonary emboli. Very small amount of pericardial fluid. There is coronary artery atherosclerosis and aortic atherosclerosis. Mediastinum/Nodes: No mediastinal mass or lymphadenopathy. Lungs/Pleura: Large left effusion layering dependently, probably larger than was seen yesterday.  Complete collapse of left lower lobe and partial collapse of the left upper lobe. Small dependent effusion on the right with dependent volume loss in the right lower lobe. Upper Abdomen: Negative Musculoskeletal: Ordinary chronic degenerative changes affect the spine. Review of the MIP images confirms the above findings. IMPRESSION: No pulmonary emboli. Large left effusion, probably larger than was seen yesterday. Complete collapse of the left lower lobe and partial collapse of left upper lobe. Small right effusion with dependent volume loss in the right lower lobe. Aortic and coronary artery atherosclerosis. Electronically Signed   By: Nelson Chimes M.D.   On: 12/29/2016 09:34    Lab Data:  CBC:  Recent Labs Lab 12/29/16 0000 12/30/16 0648  WBC 22.3* 21.1*  NEUTROABS 20.1*  --   HGB 12.0* 12.6*  HCT 37.1* 38.8*  MCV 100.0 99.7  PLT 541* [id number]*   Basic Metabolic Panel:  Recent Labs Lab 12/29/16 0000  NA 136  K 4.2  CL 103  CO2 22  GLUCOSE 158*  BUN 14  CREATININE 1.09  CALCIUM 8.3*   GFR: Estimated Creatinine Clearance: 52 mL/min (by C-G formula based on SCr of 1.09 mg/dL). Liver Function Tests:  Recent Labs Lab 12/29/16 0000  AST 22  ALT 25  ALKPHOS 55  BILITOT 0.6  PROT 6.4*  ALBUMIN 2.9*   No results for input(s): LIPASE, AMYLASE in the last 168 hours. No results for input(s): AMMONIA in the last 168 hours. Coagulation Profile:  Recent Labs Lab 12/29/16 1455  INR 1.24   Cardiac Enzymes:  Recent Labs Lab 12/29/16 0246 12/29/16 0605 12/29/16 0828  TROPONINI <0.03 <0.03 <0.03   BNP (last 3 results) No results for input(s): PROBNP in the last 8760 hours. HbA1C: No results for input(s): HGBA1C in the  last 72 hours. CBG: No results for input(s): GLUCAP in the last 168 hours. Lipid Profile: No results for input(s): CHOL, HDL, LDLCALC, TRIG, CHOLHDL, LDLDIRECT in the last 72 hours. Thyroid Function Tests: No results for input(s): TSH, T4TOTAL, FREET4,  T3FREE, THYROIDAB in the last 72 hours. Anemia Panel: No results for input(s): VITAMINB12, FOLATE, FERRITIN, TIBC, IRON, RETICCTPCT in the last 72 hours. Urine analysis: No results found for: COLORURINE, APPEARANCEUR, LABSPEC, PHURINE, GLUCOSEU, HGBUR, BILIRUBINUR, KETONESUR, PROTEINUR, UROBILINOGEN, NITRITE, Corliss Skains M.D. [facility] 12/30/2016, 12:09 PM  Pager: [phone] Between 7am to 7pm - call Pager - [phone]  After 7pm go to [email or web address] - password [email or web address]  Call night coverage person covering after 7pm

## 2016-12-30 NOTE — Progress Notes (Signed)
Post-thora film w/ complex loculated left effusion.  LDH >400 so exudate.  Plan D/c CT  Thoracic surgery called. Needs VATS  Erick Colace ACNP-BC [place] Pager # [phone] OR # [phone] if no answer

## 2016-12-30 NOTE — Progress Notes (Addendum)
Name: Joseph Roman MRN: [id number] DOB: 03-08-1930    ADMISSION DATE:  12/28/2016 CONSULTATION DATE:  1/30  REFERRING MD : [facility]  CHIEF COMPLAINT:  Chest pain  SIGNIFICANT EVENTS    STUDIES:     SUBJECTIVE:  Still w/ some left sided cp VITAL SIGNS: Temp:  [97.7 F (36.5 C)-100.1 F (37.8 C)] 97.7 F (36.5 C) (01/31 0704) Pulse Rate:  [73-83] 79 (01/31 0532) Resp:  [18-25] 18 (01/31 0532) BP: (113-143)/(59-76) 123/67 (01/31 0532) SpO2:  [90 %-96 %] 94 % (01/31 0704)  General appearance:  83 Year old  Male, well nourished NAD  conversant  Eyes: anicteric sclerae icteric , moist conjunctivae; PERRL, EOMI bilaterally. Mouth:  membranes and no mucosal ulcerations; normal hard and soft palate Neck: Trachea midline; neck supple, no JVD Lungs/chest: CTA, decreased on left with normal respiratory effort and no intercostal retractions CV: RRR, no MRGs  Abdomen: Soft, non-tender; no masses or HSM Extremities: No peripheral edema or extremity lymphadenopathy Skin: Normal temperature, turgor and texture; no rash, ulcers or subcutaneous nodules Psych: Appropriate affect, alert and oriented to person, place and time   Recent Labs Lab 12/29/16 0000  NA 136  K 4.2  CL 103  CO2 22  BUN 14  CREATININE 1.09  GLUCOSE 158*    Recent Labs Lab 12/29/16 0000 12/30/16 0648  HGB 12.0* 12.6*  HCT 37.1* 38.8*  WBC 22.3* 21.1*  PLT 541* 499*   Dg Chest 2 View  Result Date: 12/28/2016 CLINICAL DATA:  Dyspnea and left-sided chest pain tonight. EXAM: CHEST  2 VIEW COMPARISON:  05/08/2008 FINDINGS: Dense consolidation and effusion in the left base, consistent with pneumonia. The right lung is clear. The pulmonary vasculature is normal. Heart size is normal. IMPRESSION: Left base consolidation and left pleural effusion, likely pneumonia. Followup PA and lateral chest X-ray is recommended in 3-4 weeks following trial of antibiotic therapy to ensure resolution and exclude  underlying malignancy. Electronically Signed   By: Andreas Newport M.D.   On: 12/28/2016 23:43   Ct Angio Chest Pe W Or Wo Contrast  Result Date: 12/29/2016 CLINICAL DATA:  Left-sided chest pain and shortness of breath beginning 2 weeks ago. EXAM: CT ANGIOGRAPHY CHEST WITH CONTRAST TECHNIQUE: Multidetector CT imaging of the chest was performed using the standard protocol during bolus administration of intravenous contrast. Multiplanar CT image reconstructions and MIPs were obtained to evaluate the vascular anatomy. CONTRAST:  100 cc Isovue 370 COMPARISON:  Chest radiography yesterday. FINDINGS: Cardiovascular: Pulmonary arterial opacification is good. There are no pulmonary emboli. Very small amount of pericardial fluid. There is coronary artery atherosclerosis and aortic atherosclerosis. Mediastinum/Nodes: No mediastinal mass or lymphadenopathy. Lungs/Pleura: Large left effusion layering dependently, probably larger than was seen yesterday. Complete collapse of left lower lobe and partial collapse of the left upper lobe. Small dependent effusion on the right with dependent volume loss in the right lower lobe. Upper Abdomen: Negative Musculoskeletal: Ordinary chronic degenerative changes affect the spine. Review of the MIP images confirms the above findings. IMPRESSION: No pulmonary emboli. Large left effusion, probably larger than was seen yesterday. Complete collapse of the left lower lobe and partial collapse of left upper lobe. Small right effusion with dependent volume loss in the right lower lobe. Aortic and coronary artery atherosclerosis. Electronically Signed   By: Nelson Chimes M.D.   On: 12/29/2016 09:34    ASSESSMENT   CAP (NOS) Large left pleural effusion (? parapneumonic vs traumatic hemothorax) Leukocytosis  Sepsis  Hypothyroidism Recent fall  on plavix and left chest trauma  Discussion Needs to have the left effusion sampled given concern for parapneumonic vs traumatic hemothorax.    Plan Cont cefepime and vanc Diagnostic and therapeutic thora PRN toradol F/u cultures Hold plavix until bleeding issues confirmed or ruled out.   Erick Colace ACNP-BC [place] Pager # [phone] OR # [phone] if no answer  Attending Note:  83 year old male with a large right sided pleural effusion on CXR that I reviewed myself.  Status post fall with sepsis so ddx is hemothorax vs parapneumonic effusion.  On exam, decreased BS on the right with clear left lung.  Discussed with PCCM-NP.  Will perform thora today and send fluid for analysis.   Pleural effusion:  - Thora today.  - Send fluid for analysis.  - F/U CXR  HCAP:  - Cefepime.  - F/U on cultures  Leukocytosis:  - CBC in AM  Hypoxemia:  - Titrate O2 for sat of 88-92%.  - Will likely d/c post thora.  Patient seen and examined, agree with above note.  I dictated the care and orders written for this patient under my direction.  Rush Farmer, MD [phone]

## 2016-12-31 ENCOUNTER — Inpatient Hospital Stay (HOSPITAL_COMMUNITY): Payer: Medicare Other

## 2016-12-31 ENCOUNTER — Encounter (HOSPITAL_COMMUNITY)
Admission: EM | Disposition: A | Discharge status: Home / Self Care | Payer: Self-pay | Source: Home / Self Care | Attending: Surgery

## 2016-12-31 ENCOUNTER — Inpatient Hospital Stay (HOSPITAL_COMMUNITY): Payer: Medicare Other | Admitting: Anesthesiology

## 2016-12-31 ENCOUNTER — Encounter (HOSPITAL_COMMUNITY): Payer: Self-pay | Admitting: Anesthesiology

## 2016-12-31 DIAGNOSIS — Z9889 Other specified postprocedural states: Secondary | ICD-10-CM

## 2016-12-31 DIAGNOSIS — D72829 Elevated white blood cell count, unspecified: Secondary | ICD-10-CM

## 2016-12-31 DIAGNOSIS — J869 Pyothorax without fistula: Secondary | ICD-10-CM

## 2016-12-31 HISTORY — PX: PLEURAL EFFUSION DRAINAGE: SHX5099

## 2016-12-31 HISTORY — PX: VIDEO ASSISTED THORACOSCOPY: SHX5073

## 2016-12-31 LAB — SURGICAL PCR SCREEN
MRSA, PCR: NEGATIVE
STAPHYLOCOCCUS AUREUS: NEGATIVE

## 2016-12-31 LAB — BASIC METABOLIC PANEL
ANION GAP: 6 (ref 5–15)
BUN: 13 mg/dL (ref 6–20)
CO2: 28 mmol/L (ref 22–32)
Calcium: 8.2 mg/dL — ABNORMAL LOW (ref 8.9–10.3)
Chloride: 100 mmol/L — ABNORMAL LOW (ref 101–111)
Creatinine, Ser: 0.98 mg/dL (ref 0.61–1.24)
GFR calc non Af Amer: >60 mL/min (ref >60)
GLUCOSE: 114 mg/dL — AB (ref 65–99)
POTASSIUM: 5 mmol/L (ref 3.5–5.1)
Sodium: 134 mmol/L — ABNORMAL LOW (ref 135–145)

## 2016-12-31 LAB — TYPE AND SCREEN
ABO/RH(D): O POS
ANTIBODY SCREEN: NEGATIVE

## 2016-12-31 LAB — GLUCOSE, CAPILLARY
Glucose-Capillary: 122 mg/dL — ABNORMAL HIGH (ref 65–99)
Glucose-Capillary: 139 mg/dL — ABNORMAL HIGH (ref 65–99)

## 2016-12-31 LAB — PH, BODY FLUID: pH, Body Fluid: 7.5

## 2016-12-31 LAB — CBC
HEMATOCRIT: 38.8 % — AB (ref 39.0–52.0)
HEMOGLOBIN: 12.5 g/dL — AB (ref 13.0–17.0)
MCH: 32.2 pg (ref 26.0–34.0)
MCHC: 32.2 g/dL (ref 30.0–36.0)
MCV: 100 fL (ref 78.0–100.0)
Platelets: 504 10*3/uL — ABNORMAL HIGH (ref 150–400)
RBC: 3.88 MIL/uL — AB (ref 4.22–5.81)
RDW: 13.5 % (ref 11.5–15.5)
WBC: 20.6 10*3/uL — AB (ref 4.0–10.5)

## 2016-12-31 LAB — ABO/RH: ABO/RH(D): O POS

## 2016-12-31 SURGERY — VIDEO ASSISTED THORACOSCOPY
Anesthesia: General | Site: Chest | Laterality: Left

## 2016-12-31 MED ORDER — FENTANYL CITRATE (PF) 100 MCG/2ML IJ SOLN
INTRAMUSCULAR | Status: DC | PRN
  Administered 2016-12-31: 100 ug via INTRAVENOUS
  Administered 2016-12-31: 50 ug via INTRAVENOUS
  Administered 2016-12-31: 100 ug via INTRAVENOUS

## 2016-12-31 MED ORDER — PROMETHAZINE HCL 25 MG/ML IJ SOLN: 6.2500 mg | INTRAMUSCULAR | Status: DC | PRN

## 2016-12-31 MED ORDER — LACTATED RINGERS IV SOLN
INTRAVENOUS | Status: DC
  Administered 2016-12-31 (×2): via INTRAVENOUS

## 2016-12-31 MED ORDER — ACETAMINOPHEN 500 MG PO TABS
1000.0000 mg | ORAL_TABLET | Freq: Four times a day (QID) | ORAL | Status: DC
  Administered 2016-12-31 – 2017-01-03 (×10): 1000 mg via ORAL
  Filled 2016-12-31 (×10): qty 2

## 2016-12-31 MED ORDER — BISACODYL 5 MG PO TBEC
10.0000 mg | DELAYED_RELEASE_TABLET | Freq: Every day | ORAL | Status: DC
  Administered 2017-01-01 – 2017-01-03 (×3): 10 mg via ORAL
  Filled 2016-12-31 (×2): qty 2

## 2016-12-31 MED ORDER — ROCURONIUM BROMIDE 100 MG/10ML IV SOLN
INTRAVENOUS | Status: DC | PRN
  Administered 2016-12-31: 50 mg via INTRAVENOUS

## 2016-12-31 MED ORDER — PROPOFOL 10 MG/ML IV BOLUS
INTRAVENOUS | Status: AC
Start: 2016-12-31 — End: 2016-12-31
  Filled 2016-12-31: qty 20

## 2016-12-31 MED ORDER — OXYCODONE HCL 5 MG PO TABS: 5.0000 mg | ORAL_TABLET | ORAL | Status: DC | PRN

## 2016-12-31 MED ORDER — MORPHINE SULFATE (PF) 2 MG/ML IV SOLN: 1.0000 mg | INTRAVENOUS | Status: DC | PRN

## 2016-12-31 MED ORDER — PHENYLEPHRINE HCL 10 MG/ML IJ SOLN
INTRAVENOUS | Status: DC | PRN
  Administered 2016-12-31: 25 ug/min via INTRAVENOUS

## 2016-12-31 MED ORDER — POTASSIUM CHLORIDE 2 MEQ/ML IV SOLN
30.0000 meq | Freq: Every day | INTRAVENOUS | Status: DC | PRN
  Filled 2016-12-31: qty 15

## 2016-12-31 MED ORDER — DEXTROSE-NACL 5-0.9 % IV SOLN
INTRAVENOUS | Status: DC
  Administered 2016-12-31 – 2017-01-02 (×2): via INTRAVENOUS

## 2016-12-31 MED ORDER — ORAL CARE MOUTH RINSE
15.0000 mL | Freq: Two times a day (BID) | OROMUCOSAL | Status: DC
  Administered 2016-12-31 – 2017-01-02 (×4): 15 mL via OROMUCOSAL

## 2016-12-31 MED ORDER — ONDANSETRON HCL 4 MG/2ML IJ SOLN: 4.0000 mg | Freq: Four times a day (QID) | INTRAMUSCULAR | Status: DC | PRN

## 2016-12-31 MED ORDER — NALOXONE HCL 0.4 MG/ML IJ SOLN
0.4000 mg | INTRAMUSCULAR | Status: DC | PRN
  Filled 2016-12-31: qty 1

## 2016-12-31 MED ORDER — FENTANYL CITRATE (PF) 100 MCG/2ML IJ SOLN
INTRAMUSCULAR | Status: AC
  Administered 2016-12-31: 50 ug
  Filled 2016-12-31: qty 2

## 2016-12-31 MED ORDER — HYDROMORPHONE HCL 1 MG/ML IJ SOLN
INTRAMUSCULAR | Status: AC
  Administered 2016-12-31: 0.5 mg via INTRAVENOUS
  Filled 2016-12-31: qty 0.5

## 2016-12-31 MED ORDER — MIDAZOLAM HCL 2 MG/2ML IJ SOLN: 2.0000 mg | Freq: Once | INTRAMUSCULAR | Status: DC

## 2016-12-31 MED ORDER — DIPHENHYDRAMINE HCL 50 MG/ML IJ SOLN
12.5000 mg | Freq: Four times a day (QID) | INTRAMUSCULAR | Status: DC | PRN
  Filled 2016-12-31: qty 0.25

## 2016-12-31 MED ORDER — LIDOCAINE 2% (20 MG/ML) 5 ML SYRINGE
INTRAMUSCULAR | Status: AC
  Filled 2016-12-31: qty 5

## 2016-12-31 MED ORDER — ONDANSETRON HCL 4 MG/2ML IJ SOLN
4.0000 mg | Freq: Four times a day (QID) | INTRAMUSCULAR | Status: DC | PRN
  Filled 2016-12-31: qty 2

## 2016-12-31 MED ORDER — TRAMADOL HCL 50 MG PO TABS: 50.0000 mg | ORAL_TABLET | Freq: Four times a day (QID) | ORAL | Status: DC | PRN

## 2016-12-31 MED ORDER — DEXTROSE 5 % IV SOLN
2.0000 g | Freq: Once | INTRAVENOUS | Status: DC
  Filled 2016-12-31: qty 2

## 2016-12-31 MED ORDER — FENTANYL CITRATE (PF) 250 MCG/5ML IJ SOLN
INTRAMUSCULAR | Status: AC
Start: 2016-12-31 — End: 2016-12-31
  Filled 2016-12-31: qty 5

## 2016-12-31 MED ORDER — SUGAMMADEX SODIUM 200 MG/2ML IV SOLN
INTRAVENOUS | Status: DC | PRN
  Administered 2016-12-31: 200 mg via INTRAVENOUS

## 2016-12-31 MED ORDER — MIDAZOLAM HCL 5 MG/5ML IJ SOLN
INTRAMUSCULAR | Status: DC | PRN
  Administered 2016-12-31: 1 mg via INTRAVENOUS

## 2016-12-31 MED ORDER — DIPHENHYDRAMINE HCL 12.5 MG/5ML PO ELIX
12.5000 mg | ORAL_SOLUTION | Freq: Four times a day (QID) | ORAL | Status: DC | PRN
  Filled 2016-12-31: qty 5

## 2016-12-31 MED ORDER — PROPOFOL 10 MG/ML IV BOLUS
INTRAVENOUS | Status: AC
  Filled 2016-12-31: qty 20

## 2016-12-31 MED ORDER — PROPOFOL 10 MG/ML IV BOLUS
INTRAVENOUS | Status: DC | PRN
  Administered 2016-12-31: 100 mg via INTRAVENOUS

## 2016-12-31 MED ORDER — ONDANSETRON HCL 4 MG/2ML IJ SOLN
INTRAMUSCULAR | Status: AC
  Filled 2016-12-31: qty 2

## 2016-12-31 MED ORDER — MIDAZOLAM HCL 2 MG/2ML IJ SOLN
INTRAMUSCULAR | Status: AC
  Administered 2016-12-31: 0.5 mg
  Filled 2016-12-31: qty 2

## 2016-12-31 MED ORDER — SODIUM CHLORIDE 0.9% FLUSH: 9.0000 mL | INTRAVENOUS | Status: DC | PRN

## 2016-12-31 MED ORDER — FENTANYL CITRATE (PF) 250 MCG/5ML IJ SOLN
INTRAMUSCULAR | Status: AC
  Filled 2016-12-31: qty 5

## 2016-12-31 MED ORDER — HYDROMORPHONE HCL 1 MG/ML IJ SOLN
0.2500 mg | INTRAMUSCULAR | Status: DC | PRN
  Administered 2016-12-31: 0.5 mg via INTRAVENOUS

## 2016-12-31 MED ORDER — SENNOSIDES-DOCUSATE SODIUM 8.6-50 MG PO TABS
1.0000 | ORAL_TABLET | Freq: Every day | ORAL | Status: DC
  Administered 2016-12-31: 1 via ORAL
  Filled 2016-12-31: qty 1

## 2016-12-31 MED ORDER — FENTANYL 40 MCG/ML IV SOLN
INTRAVENOUS | Status: DC
  Administered 2016-12-31: 15:00:00 via INTRAVENOUS
  Administered 2016-12-31: 165 ug via INTRAVENOUS
  Administered 2017-01-01: 15 ug via INTRAVENOUS
  Administered 2017-01-01: 75 ug via INTRAVENOUS
  Administered 2017-01-01: 14.2 mL via INTRAVENOUS
  Administered 2017-01-01: 30 ug via INTRAVENOUS
  Administered 2017-01-01: 13.1 mL via INTRAVENOUS
  Administered 2017-01-01 – 2017-01-02 (×2): 45 ug via INTRAVENOUS
  Administered 2017-01-02: 30 ug via INTRAVENOUS
  Administered 2017-01-02: 7.1 mL via INTRAVENOUS
  Administered 2017-01-02: 7 mL via INTRAVENOUS
  Administered 2017-01-03: 120 ug via INTRAVENOUS
  Administered 2017-01-03: 01:00:00 via INTRAVENOUS
  Filled 2016-12-31 (×2): qty 25

## 2016-12-31 MED ORDER — LIDOCAINE HCL (CARDIAC) 20 MG/ML IV SOLN
INTRAVENOUS | Status: DC | PRN
  Administered 2016-12-31: 100 mg via INTRAVENOUS

## 2016-12-31 MED ORDER — FENTANYL CITRATE (PF) 100 MCG/2ML IJ SOLN: 100.0000 ug | Freq: Once | INTRAMUSCULAR | Status: DC

## 2016-12-31 MED ORDER — 0.9 % SODIUM CHLORIDE (POUR BTL) OPTIME
TOPICAL | Status: DC | PRN
  Administered 2016-12-31: 2000 mL

## 2016-12-31 MED ORDER — MIDAZOLAM HCL 2 MG/2ML IJ SOLN
INTRAMUSCULAR | Status: AC
  Filled 2016-12-31: qty 2

## 2016-12-31 MED ORDER — INSULIN ASPART 100 UNIT/ML ~~LOC~~ SOLN
0.0000 [IU] | SUBCUTANEOUS | Status: DC
  Administered 2016-12-31 – 2017-01-01 (×3): 2 [IU] via SUBCUTANEOUS

## 2016-12-31 MED ORDER — ONDANSETRON HCL 4 MG/2ML IJ SOLN
INTRAMUSCULAR | Status: DC | PRN
  Administered 2016-12-31: 4 mg via INTRAVENOUS

## 2016-12-31 MED ORDER — ACETAMINOPHEN 160 MG/5ML PO SOLN
1000.0000 mg | Freq: Four times a day (QID) | ORAL | Status: DC
  Administered 2017-01-01: 1000 mg via ORAL
  Filled 2016-12-31: qty 40.6

## 2016-12-31 MED ORDER — FENTANYL 40 MCG/ML IV SOLN
INTRAVENOUS | Status: AC
  Filled 2016-12-31: qty 25

## 2016-12-31 SURGICAL SUPPLY — 66 items
CANISTER SUCTION 2500CC (MISCELLANEOUS) ×8 IMPLANT
CATH KIT ON Q 5IN SLV (PAIN MANAGEMENT) IMPLANT
CATH THORACIC 28FR (CATHETERS) IMPLANT
CATH THORACIC 36FR (CATHETERS) ×4 IMPLANT
CATH THORACIC 36FR RT ANG (CATHETERS) IMPLANT
CONN ST 1/4X3/8  BEN (MISCELLANEOUS) ×2
CONN ST 1/4X3/8 BEN (MISCELLANEOUS) ×2 IMPLANT
CONT SPEC 4OZ CLIKSEAL STRL BL (MISCELLANEOUS) ×8 IMPLANT
COVER SURGICAL LIGHT HANDLE (MISCELLANEOUS) ×8 IMPLANT
DERMABOND ADVANCED (GAUZE/BANDAGES/DRESSINGS)
DERMABOND ADVANCED .7 DNX12 (GAUZE/BANDAGES/DRESSINGS) IMPLANT
DRAIN CHANNEL 32F RND 10.7 FF (WOUND CARE) ×4 IMPLANT
DRAPE CAMERA VIDEO/LASER (DRAPES) IMPLANT
DRAPE LAPAROSCOPIC ABDOMINAL (DRAPES) ×4 IMPLANT
DRAPE WARM FLUID 44X44 (DRAPE) IMPLANT
ELECT REM PT RETURN 9FT ADLT (ELECTROSURGICAL) ×4
ELECTRODE REM PT RTRN 9FT ADLT (ELECTROSURGICAL) ×2 IMPLANT
GAUZE SPONGE 4X4 12PLY STRL (GAUZE/BANDAGES/DRESSINGS) ×4 IMPLANT
GLOVE BIOGEL PI IND STRL 6 (GLOVE) ×2 IMPLANT
GLOVE BIOGEL PI IND STRL 6.5 (GLOVE) ×6 IMPLANT
GLOVE BIOGEL PI INDICATOR 6 (GLOVE) ×2
GLOVE BIOGEL PI INDICATOR 6.5 (GLOVE) ×6
GLOVE EUDERMIC 7 POWDERFREE (GLOVE) ×8 IMPLANT
GOWN STRL REUS W/ TWL LRG LVL3 (GOWN DISPOSABLE) ×8 IMPLANT
GOWN STRL REUS W/ TWL XL LVL3 (GOWN DISPOSABLE) ×2 IMPLANT
GOWN STRL REUS W/TWL LRG LVL3 (GOWN DISPOSABLE) ×8
GOWN STRL REUS W/TWL XL LVL3 (GOWN DISPOSABLE) ×2
KIT BASIN OR (CUSTOM PROCEDURE TRAY) ×4 IMPLANT
KIT ROOM TURNOVER OR (KITS) ×4 IMPLANT
KIT SUCTION CATH 14FR (SUCTIONS) ×4 IMPLANT
NS IRRIG 1000ML POUR BTL (IV SOLUTION) ×8 IMPLANT
PACK CHEST (CUSTOM PROCEDURE TRAY) ×4 IMPLANT
PAD ARMBOARD 7.5X6 YLW CONV (MISCELLANEOUS) ×8 IMPLANT
SEALANT SURG COSEAL 4ML (VASCULAR PRODUCTS) IMPLANT
SEALANT SURG COSEAL 8ML (VASCULAR PRODUCTS) IMPLANT
SOLUTION ANTI FOG 6CC (MISCELLANEOUS) ×4 IMPLANT
SPECIMEN JAR MEDIUM (MISCELLANEOUS) IMPLANT
SPONGE GAUZE 4X4 12PLY STER LF (GAUZE/BANDAGES/DRESSINGS) ×4 IMPLANT
SUT ETHILON 3 0 FSL (SUTURE) ×4 IMPLANT
SUT PROLENE 3 0 SH DA (SUTURE) IMPLANT
SUT PROLENE 4 0 RB 1 (SUTURE)
SUT PROLENE 4-0 RB1 .5 CRCL 36 (SUTURE) IMPLANT
SUT SILK  1 MH (SUTURE) ×4
SUT SILK 1 MH (SUTURE) ×4 IMPLANT
SUT SILK 2 0SH CR/8 30 (SUTURE) IMPLANT
SUT VIC AB 1 CTX 36 (SUTURE)
SUT VIC AB 1 CTX36XBRD ANBCTR (SUTURE) IMPLANT
SUT VIC AB 2 TP1 27 (SUTURE) IMPLANT
SUT VIC AB 2-0 CT1 27 (SUTURE)
SUT VIC AB 2-0 CT1 TAPERPNT 27 (SUTURE) IMPLANT
SUT VIC AB 2-0 CTX 36 (SUTURE) IMPLANT
SUT VIC AB 2-0 UR6 27 (SUTURE) ×4 IMPLANT
SUT VIC AB 3-0 MH 27 (SUTURE) IMPLANT
SUT VIC AB 3-0 SH 27 (SUTURE)
SUT VIC AB 3-0 SH 27X BRD (SUTURE) IMPLANT
SUT VIC AB 3-0 X1 27 (SUTURE) IMPLANT
SYSTEM SAHARA CHEST DRAIN ATS (WOUND CARE) ×4 IMPLANT
TAPE CLOTH SURG 4X10 WHT LF (GAUZE/BANDAGES/DRESSINGS) ×4 IMPLANT
TIP APPLICATOR SPRAY EXTEND 16 (VASCULAR PRODUCTS) IMPLANT
TOWEL OR 17X24 6PK STRL BLUE (TOWEL DISPOSABLE) IMPLANT
TOWEL OR 17X26 10 PK STRL BLUE (TOWEL DISPOSABLE) ×4 IMPLANT
TRAP SPECIMEN MUCOUS 40CC (MISCELLANEOUS) ×8 IMPLANT
TRAY FOLEY CATH 14FRSI W/METER (CATHETERS) IMPLANT
TRAY FOLEY CATH 16FR SILVER (SET/KITS/TRAYS/PACK) IMPLANT
TRAY FOLEY CATH SILVER 16FR (SET/KITS/TRAYS/PACK) ×4 IMPLANT
WATER STERILE IRR 1000ML POUR (IV SOLUTION) ×8 IMPLANT

## 2016-12-31 NOTE — Progress Notes (Signed)
Name: Joseph Roman MRN: [id number] DOB: 04-Mar-1930    ADMISSION DATE:  12/28/2016 CONSULTATION DATE:  1/30  REFERRING MD : [facility]  CHIEF COMPLAINT:  Chest pain  SIGNIFICANT EVENTS    STUDIES:     SUBJECTIVE:  No distress. Awaiting surgery this am  VITAL SIGNS: Temp:  [98.1 F (36.7 C)-99.6 F (37.6 C)] 98.4 F (36.9 C) (02/01 0808) Pulse Rate:  [74-86] 81 (02/01 0808) Resp:  [18] 18 (02/01 0622) BP: (115-132)/(55-63) 132/55 (02/01 0808) SpO2:  [93 %-96 %] 94 % (02/01 [id number]) General appearance:  83 Year old  Male well nourished NAD,   conversant  Eyes: anicteric  , moist conjunctivae; PERRL, EOMI bilaterally. Mouth:  membranes and no mucosal ulcerations; normal hard and soft palate Neck: Trachea midline; neck supple, no JVD Lungs/chest: decreased on left. Faint crackles right base, with normal respiratory effort and no intercostal retractions CV: RRR, no MRGs  Abdomen: Soft, non-tender; no masses or HSM Extremities: No peripheral edema or extremity lymphadenopathy Skin: Normal temperature, turgor and texture; no rash, ulcers or subcutaneous nodules Psych: Appropriate affect, alert and oriented to person, place and time   Recent Labs Lab 12/29/16 0000 12/31/16 0438  NA 136 134*  K 4.2 5.0  CL 103 100*  CO2 22 28  BUN 14 13  CREATININE 1.09 0.98  GLUCOSE 158* 114*    Recent Labs Lab 12/29/16 0000 12/30/16 0648 12/31/16 0438  HGB 12.0* 12.6* 12.5*  HCT 37.1* 38.8* 38.8*  WBC 22.3* 21.1* 20.6*  PLT 541* 499* 504*   Ct Angio Chest Pe W Or Wo Contrast  Result Date: 12/29/2016 CLINICAL DATA:  Left-sided chest pain and shortness of breath beginning 2 weeks ago. EXAM: CT ANGIOGRAPHY CHEST WITH CONTRAST TECHNIQUE: Multidetector CT imaging of the chest was performed using the standard protocol during bolus administration of intravenous contrast. Multiplanar CT image reconstructions and MIPs were obtained to evaluate the vascular anatomy. CONTRAST:  100 cc  Isovue 370 COMPARISON:  Chest radiography yesterday. FINDINGS: Cardiovascular: Pulmonary arterial opacification is good. There are no pulmonary emboli. Very small amount of pericardial fluid. There is coronary artery atherosclerosis and aortic atherosclerosis. Mediastinum/Nodes: No mediastinal mass or lymphadenopathy. Lungs/Pleura: Large left effusion layering dependently, probably larger than was seen yesterday. Complete collapse of left lower lobe and partial collapse of the left upper lobe. Small dependent effusion on the right with dependent volume loss in the right lower lobe. Upper Abdomen: Negative Musculoskeletal: Ordinary chronic degenerative changes affect the spine. Review of the MIP images confirms the above findings. IMPRESSION: No pulmonary emboli. Large left effusion, probably larger than was seen yesterday. Complete collapse of the left lower lobe and partial collapse of left upper lobe. Small right effusion with dependent volume loss in the right lower lobe. Aortic and coronary artery atherosclerosis. Electronically Signed   By: Nelson Chimes M.D.   On: 12/29/2016 09:34   Dg Chest Port 1 View  Result Date: 12/30/2016 CLINICAL DATA:  83 y/o  M; status post thoracentesis. EXAM: PORTABLE CHEST 1 VIEW COMPARISON:  12/29/2016 CT chest.  12/28/2016 chest radiograph FINDINGS: Large left pleural effusion with left lower lobe and partial left upper lobe compressive atelectasis grossly stable from prior chest CT localizer. Small right pleural effusion. Clear right lung. Cardiac silhouette is obscured by the pleural effusions. General changes of the spine. No acute osseous abnormality identified. No pneumothorax identified. IMPRESSION: Large left pleural effusion and left lung partial atelectasis is stable from prior CT given differences in technique. Small  right pleural effusion. Electronically Signed   By: Kristine Garbe M.D.   On: 12/30/2016 14:37    ASSESSMENT  CAP w/ Complicated left  parapneumonic effusion vs empyema  ->f/u US imaging on 1/31 suggests loculated fluid collections & thoracentesis on left only yielded about 400 ml but clearly has more pleural fluid on f/u imaging.  ->no growth to date on cultures ->seen by thoracic surgery w/ plan for thoracoscopy and possible VATs.  Plan/rec Cont current abx and narrow as able For OR on  We will s/o call PRN  Erick Colace ACNP-BC [place] Pager # [phone] OR # [phone] if no answer  Attending Note:  83 year old male with a large right sided pleural effusion on CXR that I reviewed myself.  On exam, decreased BS on the right with clear left lung.  Discussed with PCCM-NP.  Post thora, WBC of 2500 94% PMNs and LDH of 443 and pH of 7.5.  Parapneumonic effusion with loculation noted.  CVTS called and patient is to go to the OR for a VATS.  Pleural effusion:             - VATS today.             - F/U CXR  HCAP:             - Cefepime.             - F/U on cultures  Leukocytosis:             - CBC in AM  Hypoxemia:             - Titrate O2 for sat of 88-92%.             - Hopefully will be extubated post VATS in PACU and not remain intubated.  Patient seen and examined, agree with above note.  I dictated the care and orders written for this patient under my direction.  Rush Farmer, MD [phone]

## 2016-12-31 NOTE — Anesthesia Preprocedure Evaluation (Addendum)
Anesthesia Evaluation  Patient identified by MRN, date of birth, ID band Patient awake    Reviewed: Allergy & Precautions, NPO status , Patient's Chart, lab work & pertinent test results  Airway Mallampati: II  TM Distance: >3 FB Neck ROM: Full    Dental  (+) Teeth Intact   Pulmonary shortness of breath, pneumonia,  Empyema of lung   + rhonchi  + decreased breath sounds      Cardiovascular + CAD, + Past MI and + Cardiac Stents   Rhythm:Regular Rate:Normal     Neuro/Psych    GI/Hepatic GERD  ,  Endo/Other  Hypothyroidism   Renal/GU      Musculoskeletal  (+) Arthritis ,   Abdominal   Peds  Hematology   Anesthesia Other Findings   Reproductive/Obstetrics                            Anesthesia Physical Anesthesia Plan  ASA: III  Anesthesia Plan: General   Post-op Pain Management:    Induction: Intravenous  Airway Management Planned: Double Lumen EBT  Additional Equipment: Arterial line and CVP  Intra-op Plan:   Post-operative Plan: Extubation in OR and Possible Post-op intubation/ventilation  Informed Consent: I have reviewed the patients History and Physical, chart, labs and discussed the procedure including the risks, benefits and alternatives for the proposed anesthesia with the patient or authorized representative who has indicated his/her understanding and acceptance.   Dental advisory given  Plan Discussed with: CRNA  Anesthesia Plan Comments:         Anesthesia Quick Evaluation

## 2016-12-31 NOTE — Brief Op Note (Signed)
12/28/2016 - 12/31/2016  2:02 PM  PATIENT:  Joseph Roman  83 y.o. male  PRE-OPERATIVE DIAGNOSIS:  Left empyema  POST-OPERATIVE DIAGNOSIS:  left empyema  PROCEDURE:  Procedure(s): VIDEO ASSISTED THORACOSCOPY (Left) DRAINAGE OF PLEURAL EFFUSION/empyema (Left) decortication of left lung  SURGEON:  Surgeon(s) and Role:    * Gaye Pollack, MD - Primary  PHYSICIAN ASSISTANT: Lars Pinks, PA-C   ANESTHESIA:   general  EBL:  Total I/O In: 1000 [I.V.:1000] Out: 800 [Urine:350; Other:450]  BLOOD ADMINISTERED:none  DRAINS: 2  Chest Tube(s) in the left pleural space   LOCAL MEDICATIONS USED:  NONE  SPECIMEN:  Source of Specimen:  left pleural fluid  DISPOSITION OF SPECIMEN:  micro  COUNTS:  YES  TOURNIQUET:  * No tourniquets in log *  DICTATION: .Note written in EPIC  PLAN OF CARE: Admit to inpatient   PATIENT DISPOSITION:  PACU - hemodynamically stable.   Delay start of Pharmacological VTE agent (>24hrs) due to surgical blood loss or risk of bleeding: yes

## 2016-12-31 NOTE — Transfer of Care (Signed)
Immediate Anesthesia Transfer of Care Note  Patient: Joseph Roman  Procedure(s) Performed: Procedure(s): VIDEO ASSISTED THORACOSCOPY (Left) DRAINAGE OF PLEURAL EFFUSION/empyema (Left)  Patient Location: PACU  Anesthesia Type:General  Level of Consciousness: awake and patient cooperative  Airway & Oxygen Therapy: Patient Spontanous Breathing and Patient connected to face mask oxygen  Post-op Assessment: Report given to RN, Post -op Vital signs reviewed and stable and Patient moving all extremities X 4  Post vital signs: Reviewed and stable  Last Vitals:  Vitals:   12/31/16 0622 12/31/16 0808  BP: 115/60 (!) 132/55  Pulse: 74 81  Resp: 18   Temp: 36.7 C 36.9 C    Last Pain:  Vitals:   12/31/16 0808  TempSrc: Oral  PainSc:          Complications: No apparent anesthesia complications

## 2016-12-31 NOTE — Anesthesia Procedure Notes (Signed)
Procedure Name: Intubation Date/Time: 12/31/2016 12:19 PM Performed by: Greggory Stallion, Noami Bove L Pre-anesthesia Checklist: Patient identified, Emergency Drugs available, Suction available, Patient being monitored and Timeout performed Patient Re-evaluated:Patient Re-evaluated prior to inductionOxygen Delivery Method: Circle system utilized Preoxygenation: Pre-oxygenation with 100% oxygen Intubation Type: IV induction Ventilation: Oral airway inserted - appropriate to patient size Laryngoscope Size: Mac and 4 Grade View: Grade I Tube type: Oral Endobronchial tube: Left and Double lumen EBT and 39 Fr Number of attempts: 1 Airway Equipment and Method: Patient positioned with wedge pillow and Stylet Placement Confirmation: ETT inserted through vocal cords under direct vision,  positive ETCO2,  CO2 detector and breath sounds checked- equal and bilateral Secured at: 29 cm Tube secured with: Tape Dental Injury: Injury to lip  Comments: Performed by Orvis Brill

## 2016-12-31 NOTE — Anesthesia Postprocedure Evaluation (Addendum)
Anesthesia Post Note  Patient: Joseph Roman  Procedure(s) Performed: Procedure(s) (LRB): VIDEO ASSISTED THORACOSCOPY (Left) DRAINAGE OF PLEURAL EFFUSION/empyema (Left)  Patient location during evaluation: PACU Anesthesia Type: General Level of consciousness: awake and sedated Pain management: pain level controlled Vital Signs Assessment: post-procedure vital signs reviewed and stable Respiratory status: spontaneous breathing, nonlabored ventilation, respiratory function stable and patient connected to nasal cannula oxygen Cardiovascular status: blood pressure returned to baseline and stable Postop Assessment: no signs of nausea or vomiting Anesthetic complications: no       Last Vitals:  Vitals:   12/31/16 1425 12/31/16 1458  BP:    Pulse:    Resp:  15  Temp: 36.6 C     Last Pain:  Vitals:   12/31/16 1458  TempSrc:   PainSc: 5                  Deb Loudin,JAMES TERRILL

## 2016-12-31 NOTE — Progress Notes (Signed)
[facility]                                                                              Patient Demographics  Joseph Roman, is a 83 y.o. male, DOB - November 14, 1930, [id number]  Admit date - 12/28/2016   Admitting Physician Norval Morton, MD  Outpatient Primary MD for the patient is [name], MD  Outpatient specialists:   LOS - 2  days    Chief Complaint  Patient presents with  . Chest Pain       Brief summary   Brief Narrative   83 year old male with history of CAD status post PCI, hypothyroidism, hyperlipidemia and GERD presented with left-sided chest pain of one-day duration. In the past 2 weeks he saw his PCP for flulike symptoms and was treated with 5 day course of Tamiflu, however the flu test was negative. He then saw his PCP 4 days prior to admission with persistent cough and fever at home. A chest x-ray done showed pneumonia and will was started on Levaquin. However symptoms did not improve. He took nitroglycerin and 4 baby aspirin concerned about his chest pain and then called EMS. In the ED he was afebrile but tachypneic and O2 sat of 90% on room air. Blood work showed significant leukocytosis (WBC of 22K), elevated platelets, normal EKG and negative troponin. Chest x-ray showed left lower lobe consolidation with effusion. Blood cultures sent and received empiric vancomycin and Zosyn. He had mildly elevated d-dimer so a CT angiogram of the chest was done which was negative for PE but showed a large layering left lower lobe effusion with near complete collapse of the left lower lobe and part of left upper lobe.   Assessment & Plan   Principal problem Sepsis ([place]) With HCAP, large left-sided pleural effusion/ Empyema - Secondary to left lobar pneumonia with effusion.  - CT chest finding large layering left pleural effusion with near complete collapse of the left lower lobe, parapneumonic effusion versus hemothorax. - Continue empiric vancomycin  and cefepime - Blood cultures negative so far - VATS planned today   Active problems Acute hypoxic respiratory failure - O2 sats 96% on 3 L - will recheck O2 sats at rest and ambulation after thoracentesis - Follow CT chest  Large left pleural effusion, empyema - Thoracentesis attempted on 1/31, recommended CT surgery with VATS  Chest pain - Secondary to pleurisy. Supportive care with Tylenol and PRN Toradol.  CAD with history of PCI - Continue aspirin Continue statin - Plavix on hold  Hypothyroidism  continue Synthroid.  Code Status: full  DVT Prophylaxis:  SCD's  Family Communication: Discussed in detail with the patient, all imaging results, lab results explained to the patient   Disposition Plan:   Time Spent in minutes  25 minutes  Procedures:  VATS 2/1 Thoracentesis 1/31  Consultants:   PCCM CT surgery  Antimicrobials:   IV vancomycin  IV cefepime   Medications  Scheduled Meds: . [MAR Hold] aspirin  81 mg Oral Daily  . [MAR Hold] ceFEPime (MAXIPIME) IV  2 g Intravenous Q12H  . ceFEPime (MAXIPIME) IV  2 g Intravenous Once  .  fentaNYL (SUBLIMAZE) injection  100 mcg Intravenous Once  . fentaNYL   Intravenous Q4H  . [MAR Hold] guaiFENesin  600 mg Oral BID  . [MAR Hold] levothyroxine  112 mcg Oral QAC breakfast  . midazolam  2 mg Intravenous Once  . [MAR Hold] pravastatin  10 mg Oral q1800  . [MAR Hold] vancomycin  750 mg Intravenous Q12H  . [MAR Hold] vitamin B-12  1,000 mcg Oral Daily   Continuous Infusions: . dextrose 5 % and 0.9% NaCl    . lactated ringers 50 mL/hr at 12/31/16 1038   PRN Meds:.[MAR Hold] acetaminophen, diphenhydrAMINE **OR** diphenhydrAMINE, [MAR Hold] gi cocktail, HYDROmorphone (DILAUDID) injection, [MAR Hold] ipratropium-albuterol, [MAR Hold] ketorolac, [MAR Hold]  morphine injection, naloxone **AND** sodium chloride flush, [MAR Hold] nitroGLYCERIN, [MAR Hold] ondansetron (ZOFRAN) IV, ondansetron (ZOFRAN) IV, oxyCODONE,  promethazine, traMADol   Antibiotics   Anti-infectives    Start     Dose/Rate Route Frequency Ordered Stop   12/31/16 1230  ceFEPIme (MAXIPIME) 2 g in dextrose 5 % 50 mL IVPB     2 g 100 mL/hr over 30 Minutes Intravenous  Once 12/31/16 1217     12/29/16 1200  [MAR Hold]  vancomycin (VANCOCIN) IVPB 750 mg/150 ml premix     (MAR Hold since 12/31/16 1001)   750 mg 150 mL/hr over 60 Minutes Intravenous Every 12 hours 12/29/16 0247     12/29/16 1000  [MAR Hold]  ceFEPIme (MAXIPIME) 2 g in dextrose 5 % 50 mL IVPB     (MAR Hold since 12/31/16 1001)   2 g 100 mL/hr over 30 Minutes Intravenous Every 12 hours 12/29/16 0247     12/29/16 0000  vancomycin (VANCOCIN) 1,500 mg in sodium chloride 0.9 % 500 mL IVPB     1,500 mg 250 mL/hr over 120 Minutes Intravenous  Once 12/29/16 0000 12/29/16 0252   12/29/16 0000  ceFEPIme (MAXIPIME) 2 g in dextrose 5 % 50 mL IVPB     2 g 100 mL/hr over 30 Minutes Intravenous  Once 12/29/16 0000 12/29/16 0057        Subjective:   Joseph Roman was seen and examined today. No significant complaints, awaiting VATS surgery at the time of my examination this morning.  Patient denies dizziness, abdominal pain, N/V/D/C, new weakness, numbess, tingling. No acute events overnight.    Objective:   Vitals:   12/31/16 0622 12/31/16 0808 12/31/16 1425 12/31/16 1458  BP: 115/60 (!) 132/55    Pulse: 74 81    Resp: 18   15  Temp: 98.1 F (36.7 C) 98.4 F (36.9 C) 97.9 F (36.6 C)   TempSrc:  Oral    SpO2: 94% 94%  94%  Weight:      Height:        Intake/Output Summary (Last 24 hours) at 12/31/16 1508 Last data filed at 12/31/16 1340  Gross per 24 hour  Intake             1000 ml  Output              950 ml  Net               50 ml     Wt Readings from Last 3 Encounters:  12/29/16 77.9 kg (171 lb 11.8 oz)  06/05/16 77.9 kg (171 lb 12.8 oz)  06/20/15 79.5 kg (175 lb 3.2 oz)     Exam  General: Alert and oriented x 3, NAD  HEENT:    Neck:  Cardiovascular: S1 S2 auscultated, no rubs, murmurs or gallops. Regular rate and rhythm.  Respiratory: Decreased breath sounds on the left   Gastrointestinal: Soft, nontender, nondistended, + bowel sounds  Ext: no cyanosis clubbing or edema  Neuro: No new deficits  Skin: No rashes  Psych: Normal affect and demeanor, alert and oriented x3    Data Reviewed:  I have personally reviewed following labs and imaging studies  Micro Results Recent Results (from the past 240 hour(s))  Culture, blood (Routine X 2) w Reflex to ID Panel     Status: None (Preliminary result)   Collection Time: 12/29/16 12:00 AM  Result Value Ref Range Status   Specimen Description BLOOD RIGHT ARM  Final   Special Requests BOTTLES DRAWN AEROBIC AND ANAEROBIC 5ML  Final   Culture NO GROWTH 1 DAY  Final   Report Status PENDING  Incomplete  Culture, blood (Routine X 2) w Reflex to ID Panel     Status: None (Preliminary result)   Collection Time: 12/29/16 12:18 AM  Result Value Ref Range Status   Specimen Description BLOOD RIGHT HAND  Final   Special Requests BOTTLES DRAWN AEROBIC AND ANAEROBIC 5ML  Final   Culture NO GROWTH 1 DAY  Final   Report Status PENDING  Incomplete  Body fluid culture     Status: None (Preliminary result)   Collection Time: 12/30/16 12:02 PM  Result Value Ref Range Status   Specimen Description PLEURAL  Final   Special Requests NONE  Final   Gram Stain   Final    ABUNDANT WBC PRESENT, PREDOMINANTLY PMN NO ORGANISMS SEEN    Culture NO GROWTH 1 DAY  Final   Report Status PENDING  Incomplete  Surgical pcr screen     Status: None   Collection Time: 12/30/16 11:52 PM  Result Value Ref Range Status   MRSA, PCR NEGATIVE NEGATIVE Final   Staphylococcus aureus NEGATIVE NEGATIVE Final    Comment:        The Xpert SA Assay (FDA approved for NASAL specimens in patients over 41 years of age), is one component of a comprehensive surveillance program.  Test performance has been  validated by [facility] for patients greater than or equal to 46 year old. It is not intended to diagnose infection nor to guide or monitor treatment.     Radiology Reports Dg Chest 2 View  Result Date: 12/28/2016 CLINICAL DATA:  Dyspnea and left-sided chest pain tonight. EXAM: CHEST  2 VIEW COMPARISON:  05/08/2008 FINDINGS: Dense consolidation and effusion in the left base, consistent with pneumonia. The right lung is clear. The pulmonary vasculature is normal. Heart size is normal. IMPRESSION: Left base consolidation and left pleural effusion, likely pneumonia. Followup PA and lateral chest X-ray is recommended in 3-4 weeks following trial of antibiotic therapy to ensure resolution and exclude underlying malignancy. Electronically Signed   By: Andreas Newport M.D.   On: 12/28/2016 23:43   Ct Angio Chest Pe W Or Wo Contrast  Result Date: 12/29/2016 CLINICAL DATA:  Left-sided chest pain and shortness of breath beginning 2 weeks ago. EXAM: CT ANGIOGRAPHY CHEST WITH CONTRAST TECHNIQUE: Multidetector CT imaging of the chest was performed using the standard protocol during bolus administration of intravenous contrast. Multiplanar CT image reconstructions and MIPs were obtained to evaluate the vascular anatomy. CONTRAST:  100 cc Isovue 370 COMPARISON:  Chest radiography yesterday. FINDINGS: Cardiovascular: Pulmonary arterial opacification is good. There are no pulmonary emboli. Very small amount of pericardial fluid. There is coronary  artery atherosclerosis and aortic atherosclerosis. Mediastinum/Nodes: No mediastinal mass or lymphadenopathy. Lungs/Pleura: Large left effusion layering dependently, probably larger than was seen yesterday. Complete collapse of left lower lobe and partial collapse of the left upper lobe. Small dependent effusion on the right with dependent volume loss in the right lower lobe. Upper Abdomen: Negative Musculoskeletal: Ordinary chronic degenerative changes affect the spine.  Review of the MIP images confirms the above findings. IMPRESSION: No pulmonary emboli. Large left effusion, probably larger than was seen yesterday. Complete collapse of the left lower lobe and partial collapse of left upper lobe. Small right effusion with dependent volume loss in the right lower lobe. Aortic and coronary artery atherosclerosis. Electronically Signed   By: Nelson Chimes M.D.   On: 12/29/2016 09:34   Dg Chest Port 1 View  Result Date: 12/30/2016 CLINICAL DATA:  83 y/o  M; status post thoracentesis. EXAM: PORTABLE CHEST 1 VIEW COMPARISON:  12/29/2016 CT chest.  12/28/2016 chest radiograph FINDINGS: Large left pleural effusion with left lower lobe and partial left upper lobe compressive atelectasis grossly stable from prior chest CT localizer. Small right pleural effusion. Clear right lung. Cardiac silhouette is obscured by the pleural effusions. General changes of the spine. No acute osseous abnormality identified. No pneumothorax identified. IMPRESSION: Large left pleural effusion and left lung partial atelectasis is stable from prior CT given differences in technique. Small right pleural effusion. Electronically Signed   By: Kristine Garbe M.D.   On: 12/30/2016 14:37    Lab Data:  CBC:  Recent Labs Lab 12/29/16 0000 12/30/16 0648 12/31/16 0438  WBC 22.3* 21.1* 20.6*  NEUTROABS 20.1*  --   --   HGB 12.0* 12.6* 12.5*  HCT 37.1* 38.8* 38.8*  MCV 100.0 99.7 100.0  PLT 541* 499* [id number]*   Basic Metabolic Panel:  Recent Labs Lab 12/29/16 0000 12/31/16 0438  NA 136 134*  K 4.2 5.0  CL 103 100*  CO2 22 28  GLUCOSE 158* 114*  BUN 14 13  CREATININE 1.09 0.98  CALCIUM 8.3* 8.2*   GFR: Estimated Creatinine Clearance: 57.8 mL/min (by C-G formula based on SCr of 0.98 mg/dL). Liver Function Tests:  Recent Labs Lab 12/29/16 0000 12/30/16 1235  AST 22  --   ALT 25  --   ALKPHOS 55  --   BILITOT 0.6  --   PROT 6.4* 6.5  ALBUMIN 2.9*  --    No results for  input(s): LIPASE, AMYLASE in the last 168 hours. No results for input(s): AMMONIA in the last 168 hours. Coagulation Profile:  Recent Labs Lab 12/29/16 1455  INR 1.24   Cardiac Enzymes:  Recent Labs Lab 12/29/16 0246 12/29/16 0605 12/29/16 0828  TROPONINI <0.03 <0.03 <0.03   BNP (last 3 results) No results for input(s): PROBNP in the last 8760 hours. HbA1C: No results for input(s): HGBA1C in the last 72 hours. CBG: No results for input(s): GLUCAP in the last 168 hours. Lipid Profile: No results for input(s): CHOL, HDL, LDLCALC, TRIG, CHOLHDL, LDLDIRECT in the last 72 hours. Thyroid Function Tests: No results for input(s): TSH, T4TOTAL, FREET4, T3FREE, THYROIDAB in the last 72 hours. Anemia Panel: No results for input(s): VITAMINB12, FOLATE, FERRITIN, TIBC, IRON, RETICCTPCT in the last 72 hours. Urine analysis: No results found for: COLORURINE, APPEARANCEUR, LABSPEC, PHURINE, GLUCOSEU, HGBUR, BILIRUBINUR, KETONESUR, PROTEINUR, UROBILINOGEN, NITRITE, Corliss Skains M.D. [facility] 12/31/2016, 3:08 PM  Pager: [phone] Between 7am to 7pm - call Pager - [phone]  After 7pm go to [email or web address] -  password [email or web address]  Call night coverage person covering after 7pm

## 2016-12-31 NOTE — Brief Op Note (Signed)
12/28/2016 - 12/31/2016  1:55 PM  PATIENT:  Joseph Roman  83 y.o. male  PRE-OPERATIVE DIAGNOSIS:  Left empyema and pleural effusion  POST-OPERATIVE DIAGNOSIS:  Left empyema and pleural effusion  PROCEDURE:  LEFT VIDEO ASSISTED THORACOSCOPY, DRAINAGE OF LEFT PLEURAL EFFUSION and EMPYEMA  SURGEON:  Surgeon(s) and Role:    * Gaye Pollack, MD - Primary  PHYSICIAN ASSISTANT: Lars Pinks PA-C  ANESTHESIA:   general  EBL:  Total I/O In: 1000 [I.V.:1000] Out: 800 [Urine:350; Other:450]  BLOOD ADMINISTERED:none  DRAINS: Blake drain and 68 French chest tube placed in the left pleural space   SPECIMEN:  Source of Specimen:  Left pleural fluid and peel  DISPOSITION OF SPECIMEN:  Culture, AFB, pathology  COUNTS CORRECT:  YES  DICTATION: .Dragon Dictation  PLAN OF CARE: Admit to inpatient   PATIENT DISPOSITION:  PACU - hemodynamically stable.   Delay start of Pharmacological VTE agent (>24hrs) due to surgical blood loss or risk of bleeding: yes

## 2016-12-31 NOTE — Progress Notes (Signed)
Education given for thoracotomy

## 2016-12-31 NOTE — Anesthesia Procedure Notes (Addendum)
Central Venous Catheter Insertion Performed by: Rica Koyanagi, anesthesiologist Start/End2/11/2016 10:10 AM, 12/31/2016 10:36 AM Patient location: Pre-op. Preanesthetic checklist: patient identified, IV checked, site marked, risks and benefits discussed, surgical consent, monitors and equipment checked, pre-op evaluation and timeout performed Position: Trendelenburg Lidocaine 1% used for infiltration and patient sedated Hand hygiene performed , maximum sterile barriers used  and Seldinger technique used Catheter size: 8 Fr Central line was placed.Double lumen Ultrasound Notes:anatomy identified, needle tip was noted to be adjacent to the nerve/plexus identified and image(s) printed for medical record Attempts: 1 Following insertion, line sutured, dressing applied and Biopatch. Post procedure assessment: blood return through all ports

## 2017-01-01 ENCOUNTER — Encounter (HOSPITAL_COMMUNITY): Payer: Self-pay | Admitting: Surgery

## 2017-01-01 ENCOUNTER — Inpatient Hospital Stay (HOSPITAL_COMMUNITY): Payer: Medicare Other

## 2017-01-01 ENCOUNTER — Encounter (HOSPITAL_COMMUNITY): Payer: Self-pay

## 2017-01-01 DIAGNOSIS — J869 Pyothorax without fistula: Secondary | ICD-10-CM

## 2017-01-01 LAB — CBC
HCT: 32.1 % — ABNORMAL LOW (ref 39.0–52.0)
HEMOGLOBIN: 10.5 g/dL — AB (ref 13.0–17.0)
MCH: 32.3 pg (ref 26.0–34.0)
MCHC: 32.7 g/dL (ref 30.0–36.0)
MCV: 98.8 fL (ref 78.0–100.0)
Platelets: 526 10*3/uL — ABNORMAL HIGH (ref 150–400)
RBC: 3.25 MIL/uL — ABNORMAL LOW (ref 4.22–5.81)
RDW: 12.9 % (ref 11.5–15.5)
WBC: 29.2 10*3/uL — ABNORMAL HIGH (ref 4.0–10.5)

## 2017-01-01 LAB — BASIC METABOLIC PANEL
Anion gap: 11 (ref 5–15)
BUN: 17 mg/dL (ref 6–20)
CHLORIDE: 97 mmol/L — AB (ref 101–111)
CO2: 25 mmol/L (ref 22–32)
Calcium: 7.8 mg/dL — ABNORMAL LOW (ref 8.9–10.3)
Creatinine, Ser: 0.97 mg/dL (ref 0.61–1.24)
Glucose, Bld: 150 mg/dL — ABNORMAL HIGH (ref 65–99)
POTASSIUM: 4.7 mmol/L (ref 3.5–5.1)
SODIUM: 133 mmol/L — AB (ref 135–145)

## 2017-01-01 LAB — POCT I-STAT 3, ART BLOOD GAS (G3+)
ACID-BASE EXCESS: 2 mmol/L (ref 0.0–2.0)
BICARBONATE: 26.7 mmol/L (ref 20.0–28.0)
O2 Saturation: 96 %
Patient temperature: 98.6
TCO2: 28 mmol/L (ref 0–100)
pCO2 arterial: 41.9 mmHg (ref 32.0–48.0)
pH, Arterial: 7.413 (ref 7.350–7.450)
pO2, Arterial: 82 mmHg — ABNORMAL LOW (ref 83.0–108.0)

## 2017-01-01 LAB — GLUCOSE, CAPILLARY
GLUCOSE-CAPILLARY: 134 mg/dL — AB (ref 65–99)
GLUCOSE-CAPILLARY: 157 mg/dL — AB (ref 65–99)
Glucose-Capillary: 150 mg/dL — ABNORMAL HIGH (ref 65–99)
Glucose-Capillary: 153 mg/dL — ABNORMAL HIGH (ref 65–99)
Glucose-Capillary: 159 mg/dL — ABNORMAL HIGH (ref 65–99)

## 2017-01-01 LAB — ACID FAST SMEAR (AFB, MYCOBACTERIA): Acid Fast Smear: NEGATIVE

## 2017-01-01 LAB — CHOLESTEROL, BODY FLUID: CHOL FL: 57 mg/dL

## 2017-01-01 MED ORDER — VANCOMYCIN HCL IN DEXTROSE 750-5 MG/150ML-% IV SOLN
750.0000 mg | Freq: Two times a day (BID) | INTRAVENOUS | Status: DC
  Administered 2017-01-01 – 2017-01-02 (×3): 750 mg via INTRAVENOUS
  Filled 2017-01-01 (×3): qty 150

## 2017-01-01 MED ORDER — DEXTROSE 5 % IV SOLN
2.0000 g | Freq: Two times a day (BID) | INTRAVENOUS | Status: DC
  Administered 2017-01-01 – 2017-01-02 (×3): 2 g via INTRAVENOUS
  Filled 2017-01-01 (×3): qty 2

## 2017-01-01 MED ORDER — POLYETHYLENE GLYCOL 3350 17 G PO PACK: 17.0000 g | PACK | Freq: Every day | ORAL | Status: DC

## 2017-01-01 MED ORDER — POLYETHYLENE GLYCOL 3350 17 G PO PACK
17.0000 g | PACK | Freq: Every day | ORAL | Status: DC | PRN
  Administered 2017-01-02: 17 g via ORAL
  Filled 2017-01-01 (×2): qty 1

## 2017-01-01 MED ORDER — VITAMIN B-12 1000 MCG PO TABS
1000.0000 ug | ORAL_TABLET | Freq: Every day | ORAL | Status: DC
  Administered 2017-01-01 – 2017-01-04 (×4): 1000 ug via ORAL
  Filled 2017-01-01 (×4): qty 1

## 2017-01-01 MED ORDER — SENNOSIDES-DOCUSATE SODIUM 8.6-50 MG PO TABS
1.0000 | ORAL_TABLET | Freq: Two times a day (BID) | ORAL | Status: DC
  Administered 2017-01-01 – 2017-01-03 (×5): 1 via ORAL
  Filled 2017-01-01 (×6): qty 1

## 2017-01-01 NOTE — Plan of Care (Signed)
Problem: Pain Management: Goal: Pain level will decrease Outcome: Progressing Fentanyl PCA adequately controlling pain

## 2017-01-01 NOTE — Progress Notes (Signed)
1 Day Post-Op Procedure(s) (LRB): VIDEO ASSISTED THORACOSCOPY (Left) DRAINAGE OF PLEURAL EFFUSION/empyema (Left) Subjective:  No complaints. Pain under control. Ambulated well this am.  Objective: Vital signs in last 24 hours: Temp:  [97.5 F (36.4 C)-98.4 F (36.9 C)] 98.4 F (36.9 C) (02/02 0400) Pulse Rate:  [65-87] 81 (02/02 0700) Cardiac Rhythm: Normal sinus rhythm (02/02 0600) Resp:  [15-30] 25 (02/02 0700) BP: (87-132)/(41-80) 93/56 (02/02 0700) SpO2:  [94 %-100 %] 97 % (02/02 0700) Arterial Line BP: (85-158)/(44-81) 122/49 (02/02 0700)  Hemodynamic parameters for last 24 hours:    Intake/Output from previous day: 02/01 0701 - 02/02 0700 In: 1786.3 [I.V.:1786.3] Out: 2130 [Urine:1200; Chest Tube:480] Intake/Output this shift: No intake/output data recorded.  General appearance: alert and cooperative Heart: regular rate and rhythm, S1, S2 normal, no murmur, click, rub or gallop Lungs: rales bibasilar chest tube output low and serosanguinous  Lab Results:  Recent Labs  12/31/16 0438 01/01/17 0429  WBC 20.6* 29.2*  HGB 12.5* 10.5*  HCT 38.8* 32.1*  PLT 504* 526*   BMET:  Recent Labs  12/31/16 0438 01/01/17 0429  NA 134* 133*  K 5.0 4.7  CL 100* 97*  CO2 28 25  GLUCOSE 114* 150*  BUN 13 17  CREATININE 0.98 0.97  CALCIUM 8.2* 7.8*    PT/INR:  Recent Labs  12/29/16 1455  LABPROT 15.7*  INR 1.24   ABG    Component Value Date/Time   PHART 7.413 01/01/2017 0426   HCO3 26.7 01/01/2017 0426   TCO2 28 01/01/2017 0426   ACIDBASEDEF 1.0 05/07/2008 1051   O2SAT 96.0 01/01/2017 0426   CBG (last 3)   Recent Labs  12/31/16 2041 12/31/16 2352 01/01/17 0357  GLUCAP 122* 139* 134*   CLINICAL DATA:  Chest tube.  EXAM: PORTABLE CHEST 1 VIEW  COMPARISON:  12/31/2016 .  FINDINGS: Right IJ line, left chest tubes in stable position. No pneumothorax. Stable left pleural thickening. Mild right base subsegmental atelectasis. Improving left  base atelectasis. Developing opacity noted over the left mid and upper lung. This could represent a focal infiltrate. This could represent a focal loculated pleural fluid collection. PA and lateral chest x-ray suggested for further evaluation.  IMPRESSION: 1. Right IJ line and left chest tubes in stable position. No pneumothorax. Stable left pleural thickening.  2. Mild right base subsegmental atelectasis. Improving left base atelectasis/ infiltrate.  3. Developing opacity over the left mid and upper chest. This could represent a focal infiltrate. This could represent a focal loculated pleural fluid collection. A PA and lateral chest x-ray suggested for further evaluation .   Electronically Signed   By: Marcello Moores  Register   On: 01/01/2017 07:52  Assessment/Plan: S/P Procedure(s) (LRB): VIDEO ASSISTED THORACOSCOPY (Left) DRAINAGE OF PLEURAL EFFUSION/empyema (Left)  He is hemodynamically stable in sinus rhythm  Pulmonary status stable with sats 96% and ambulating well. CXR looks ok. There are persistent inflammatory changes in the left chest from all of the pleural edema. This will improve.  Leukocytosis is inflammatory and will improve. Remains afebrile.  Gram stain of pleural peel showed few G+ cocci, culture pending. Continue vanc and Maxipime until cultures finalized.  IS and ambulation  DC arterial line and foley.  Decrease IV to 50 cc/hr  Can resume ASA and Plavix at discharge.   LOS: 3 days    Joseph Roman 01/01/2017

## 2017-01-01 NOTE — Progress Notes (Signed)
Name: Joseph Roman MRN: [id number] DOB: 1930-07-01    ADMISSION DATE:  12/28/2016 CONSULTATION DATE:  1/30  REFERRING MD : [facility]  CHIEF COMPLAINT:  Chest pain  SIGNIFICANT EVENTS  12/31/2016>> L VATS/ Drainage of pleural effusion and Empyema  STUDIES:  CT Angio 12/29/2016 Large left effusion, probably larger than was seen yesterday. Complete collapse of the left lower lobe and partial collapse of left upper lobe.  Small right effusion with dependent volume loss in the right lower lobe.   SUBJECTIVE:  Feeling pretty good, breathing is better, OOB in chair.  VITAL SIGNS: Temp:  [97.5 F (36.4 C)-98.4 F (36.9 C)] 98.4 F (36.9 C) (02/02 0400) Pulse Rate:  [65-87] 81 (02/02 0700) Resp:  [15-30] 26 (02/02 0831) BP: (87-127)/(41-80) 93/56 (02/02 0700) SpO2:  [94 %-100 %] 97 % (02/02 0831) Arterial Line BP: (85-158)/(44-81) 122/49 (02/02 0700)   General appearance:  83 Year old  Male, OOB in chair, speaking in full sentences, NAD Eyes: anicteric  , moist conjunctivae; PERRL, EOMI bilaterally. Mouth:  MM pink and moist,  Neck: Trachea midline; neck supple, no JVD Lungs/chest: diminished per L side, Faint crackles right base, with normal respiratory effort no accessory muscle use. CV: RRR, no MRGs  Abdomen: Soft, non-tender; no masses or HSM Extremities: No peripheral edema or extremity lymphadenopathy Skin: Intact, no rash, lesions Psych: Alert and oriented x 3, appropriate   Recent Labs Lab 12/29/16 0000 12/31/16 0438 01/01/17 0429  NA 136 134* 133*  K 4.2 5.0 4.7  CL 103 100* 97*  CO2 22 28 25   BUN 14 13 17   CREATININE 1.09 0.98 0.97  GLUCOSE 158* 114* 150*    Recent Labs Lab 12/30/16 0648 12/31/16 0438 01/01/17 0429  HGB 12.6* 12.5* 10.5*  HCT 38.8* 38.8* 32.1*  WBC 21.1* 20.6* 29.2*  PLT 499* 504* 526*   Dg Chest Port 1 View  Result Date: 01/01/2017 CLINICAL DATA:  Chest tube. EXAM: PORTABLE CHEST 1 VIEW COMPARISON:  12/31/2016 . FINDINGS:  Right IJ line, left chest tubes in stable position. No pneumothorax. Stable left pleural thickening. Mild right base subsegmental atelectasis. Improving left base atelectasis. Developing opacity noted over the left mid and upper lung. This could represent a focal infiltrate. This could represent a focal loculated pleural fluid collection. PA and lateral chest x-ray suggested for further evaluation. IMPRESSION: 1. Right IJ line and left chest tubes in stable position. No pneumothorax. Stable left pleural thickening. 2. Mild right base subsegmental atelectasis. Improving left base atelectasis/ infiltrate. 3. Developing opacity over the left mid and upper chest. This could represent a focal infiltrate. This could represent a focal loculated pleural fluid collection. A PA and lateral chest x-ray suggested for further evaluation . Electronically Signed   By: Marcello Moores  Register   On: 01/01/2017 07:52   Dg Chest Port 1 View  Result Date: 12/31/2016 CLINICAL DATA:  Empyema, followup EXAM: PORTABLE CHEST 1 VIEW COMPARISON:  Portable chest x-ray of 12/30/2016 FINDINGS: The pedis loculated fluid/empyema has been evacuated and left chest tubes remain. There is pleural-parenchymal opacity at the left lung base consistent with small amount of fluid and atelectasis. Mild basilar atelectasis is noted on the right. Right IJ central venous catheter tip overlies the lower SVC. Heart size is stable. IMPRESSION: 1. Drainage of left pleural fluid collection representing either loculated fluid or empyema with chest tubes remaining. 2. Opacity at the left lung base most likely postoperative with atelectasis and probable small amount of fluid. Electronically Signed  By: Ivar Drape M.D.   On: 12/31/2016 15:12   Dg Chest Port 1 View  Result Date: 12/30/2016 CLINICAL DATA:  83 y/o  M; status post thoracentesis. EXAM: PORTABLE CHEST 1 VIEW COMPARISON:  12/29/2016 CT chest.  12/28/2016 chest radiograph FINDINGS: Large left pleural  effusion with left lower lobe and partial left upper lobe compressive atelectasis grossly stable from prior chest CT localizer. Small right pleural effusion. Clear right lung. Cardiac silhouette is obscured by the pleural effusions. General changes of the spine. No acute osseous abnormality identified. No pneumothorax identified. IMPRESSION: Large left pleural effusion and left lung partial atelectasis is stable from prior CT given differences in technique. Small right pleural effusion. Electronically Signed   By: Kristine Garbe M.D.   On: 12/30/2016 14:37    ASSESSMENT  CAP w/ Complicated left parapneumonic effusion vs empyema  ->f/u US imaging on 1/31 suggests loculated fluid collections & thoracentesis on left only yielded about 400 ml but clearly has more pleural fluid on f/u imaging.  ->no growth to date on cultures -> thoracic surgery took for L VATS 12/31/2016 WBC increase to 29.2 over night/ Inflammatory? CT with serosang drainage Afebrile Cultures pending Plan/rec Cont current abx / add Vanc back per Dr. Cyndia Bent Continue to follow cultures until finalized. Mobilize/ Ambulate as able Wean oxygen for Saturation goal of 88-92% Aggressive Pulmonary Toilet/ IS DC Aline and Foley per Thoracic Surgery FU CXR 01/02/2017  Magdalen Spatz, AGACNP-BC [place] [phone] 01/01/2017

## 2017-01-01 NOTE — Progress Notes (Signed)
CT surgery p.m. Rounds  Patient had good day 300 cc of drainage from chest tubes today Good appetite ready to advance diet

## 2017-01-01 NOTE — Plan of Care (Signed)
Problem: Activity: Goal: Risk for activity intolerance will decrease Outcome: Progressing Pt ambulated 1274ft around unit and tolerated well.

## 2017-01-01 NOTE — Progress Notes (Signed)
[facility]                                                                              Patient Demographics  Joseph Roman, is a 83 y.o. male, DOB - [birth year], [id number]  Admit date - 12/28/2016   Admitting Physician Norval Morton, MD  Outpatient Primary MD for the patient is [name], MD  Outpatient specialists:   LOS - 3  days    Chief Complaint  Patient presents with  . Chest Pain       Brief summary   Brief Narrative   83 year old male with history of CAD status post PCI, hypothyroidism, hyperlipidemia and GERD presented with left-sided chest pain of one-day duration. In the past 2 weeks he saw his PCP for flulike symptoms and was treated with 5 day course of Tamiflu, however the flu test was negative. He then saw his PCP 4 days prior to admission with persistent cough and fever at home. A chest x-ray done showed pneumonia and will was started on Levaquin. However symptoms did not improve. He took nitroglycerin and 4 baby aspirin concerned about his chest pain and then called EMS. In the ED he was afebrile but tachypneic and O2 sat of 90% on room air. Blood work showed significant leukocytosis (WBC of 22K), elevated platelets, normal EKG and negative troponin. Chest x-ray showed left lower lobe consolidation with effusion. Blood cultures sent and received empiric vancomycin and Zosyn. He had mildly elevated d-dimer so a CT angiogram of the chest was done which was negative for PE but showed a large layering left lower lobe effusion with near complete collapse of the left lower lobe and part of left upper lobe.   Assessment & Plan   Principal problem Sepsis ([place]) With HCAP, large left-sided pleural effusion/ Empyema - s/p VATS  Pod #1, management per CTVS - Secondary to left lobar pneumonia with effusion.  - CT chest finding large layering left pleural effusion with near complete collapse of the left lower lobe, parapneumonic effusion versus  hemothorax. - Per CCM, narrow antibiotics, no growth on cultures, vancomycin was discontinued 2/1  - Blood cultures negative so far - WBC up today, continue cefepime   Active problems Acute hypoxic respiratory failure - secondary to #1, s/p VATS, recheck home O2 eval prior to dc  Large left pleural effusion, empyema - Thoracentesis attempted on 1/31, recommended CT surgery with VATS - s/p VATS and chest tube   Chest pain - Secondary to pleurisy. Supportive care with Tylenol and PRN Toradol. - currently on fentanyl PCA - added bowel regimen  CAD with history of PCI - Continue aspirin Continue statin - Plavix on hold  Hypothyroidism  continue Synthroid.  Code Status: full  DVT Prophylaxis:  SCD's  Family Communication: Discussed in detail with the patient, all imaging results, lab results explained to the patient   Disposition Plan:   Time Spent in minutes  25 minutes  Procedures:  VATS 2/1 Thoracentesis 1/31  Consultants:   PCCM CT surgery  Antimicrobials:   IV vancomycin  IV cefepime   Medications  Scheduled Meds: . acetaminophen  1,000 mg Oral  Q6H   Or  . acetaminophen (TYLENOL) oral liquid 160 mg/5 mL  1,000 mg Oral Q6H  . bisacodyl  10 mg Oral Daily  . fentaNYL   Intravenous Q4H  . insulin aspart  0-24 Units Subcutaneous Q4H  . levothyroxine  112 mcg Oral QAC breakfast  . mouth rinse  15 mL Mouth Rinse BID  . pravastatin  10 mg Oral q1800  . senna-docusate  1 tablet Oral QHS  . vitamin B-12  1,000 mcg Oral Daily   Continuous Infusions: . dextrose 5 % and 0.9% NaCl 75 mL/hr at 01/01/17 0700   PRN Meds:.diphenhydrAMINE **OR** diphenhydrAMINE, ipratropium-albuterol, ketorolac, naloxone **AND** sodium chloride flush, ondansetron (ZOFRAN) IV, oxyCODONE, potassium chloride (KCL MULTIRUN) 30 mEq in 265 mL IVPB, traMADol   Antibiotics   Anti-infectives    Start     Dose/Rate Route Frequency Ordered Stop   12/31/16 1230  ceFEPIme (MAXIPIME) 2 g  in dextrose 5 % 50 mL IVPB  Status:  Discontinued     2 g 100 mL/hr over 30 Minutes Intravenous  Once 12/31/16 1217 12/31/16 2024   12/29/16 1200  vancomycin (VANCOCIN) IVPB 750 mg/150 ml premix  Status:  Discontinued     750 mg 150 mL/hr over 60 Minutes Intravenous Every 12 hours 12/29/16 0247 12/31/16 2024   12/29/16 1000  ceFEPIme (MAXIPIME) 2 g in dextrose 5 % 50 mL IVPB  Status:  Discontinued     2 g 100 mL/hr over 30 Minutes Intravenous Every 12 hours 12/29/16 0247 12/31/16 2024   12/29/16 0000  vancomycin (VANCOCIN) 1,500 mg in sodium chloride 0.9 % 500 mL IVPB     1,500 mg 250 mL/hr over 120 Minutes Intravenous  Once 12/29/16 0000 12/29/16 0252   12/29/16 0000  ceFEPIme (MAXIPIME) 2 g in dextrose 5 % 50 mL IVPB     2 g 100 mL/hr over 30 Minutes Intravenous  Once 12/29/16 0000 12/29/16 0057        Subjective:   Joseph Roman was seen and examined today. Pain 6/10, on fentanyl PCA, chest tube +.  No fevers. Slept okay. Patient denies dizziness, abdominal pain, N/V/D/C, new weakness, numbess, tingling.   Objective:   Vitals:   01/01/17 0530 01/01/17 0600 01/01/17 0630 01/01/17 0700  BP:  108/65  (!) 93/56  Pulse:  75 83 81  Resp: 20 (!) 27 (!) 29 (!) 25  Temp:      TempSrc:      SpO2:  97% 95% 97%  Weight:      Height:        Intake/Output Summary (Last 24 hours) at 01/01/17 0733 Last data filed at 01/01/17 0700  Gross per 24 hour  Intake          1786.25 ml  Output             2130 ml  Net          -343.75 ml     Wt Readings from Last 3 Encounters:  12/29/16 77.9 kg (171 lb 11.8 oz)  06/05/16 77.9 kg (171 lb 12.8 oz)  06/20/15 79.5 kg (175 lb 3.2 oz)     Exam  General: Alert and oriented x 3, NAD,   HEENT:    Neck:   Cardiovascular: S1 S2 clear, RRR  Respiratory: Decreased breath sounds on the left, chest tube +.    Gastrointestinal: Soft, nontender, nondistended, + bowel sounds  Ext: no cyanosis clubbing or edema  Neuro: No new  deficits  Skin:  No rashes  Psych: Normal affect and demeanor, alert and oriented x3    Data Reviewed:  I have personally reviewed following labs and imaging studies  Micro Results Recent Results (from the past 240 hour(s))  Culture, blood (Routine X 2) w Reflex to ID Panel     Status: None (Preliminary result)   Collection Time: 12/29/16 12:00 AM  Result Value Ref Range Status   Specimen Description BLOOD RIGHT ARM  Final   Special Requests BOTTLES DRAWN AEROBIC AND ANAEROBIC 5ML  Final   Culture NO GROWTH 2 DAYS  Final   Report Status PENDING  Incomplete  Culture, blood (Routine X 2) w Reflex to ID Panel     Status: None (Preliminary result)   Collection Time: 12/29/16 12:18 AM  Result Value Ref Range Status   Specimen Description BLOOD RIGHT HAND  Final   Special Requests BOTTLES DRAWN AEROBIC AND ANAEROBIC 5ML  Final   Culture NO GROWTH 2 DAYS  Final   Report Status PENDING  Incomplete  Body fluid culture     Status: None (Preliminary result)   Collection Time: 12/30/16 12:02 PM  Result Value Ref Range Status   Specimen Description PLEURAL  Final   Special Requests NONE  Final   Gram Stain   Final    ABUNDANT WBC PRESENT, PREDOMINANTLY PMN NO ORGANISMS SEEN    Culture NO GROWTH 1 DAY  Final   Report Status PENDING  Incomplete  Surgical pcr screen     Status: None   Collection Time: 12/30/16 11:52 PM  Result Value Ref Range Status   MRSA, PCR NEGATIVE NEGATIVE Final   Staphylococcus aureus NEGATIVE NEGATIVE Final    Comment:        The Xpert SA Assay (FDA approved for NASAL specimens in patients over 38 years of age), is one component of a comprehensive surveillance program.  Test performance has been validated by [facility] for patients greater than or equal to 23 year old. It is not intended to diagnose infection nor to guide or monitor treatment.   Aerobic Culture (superficial specimen)     Status: None (Preliminary result)   Collection Time: 12/31/16   1:16 PM  Result Value Ref Range Status   Specimen Description TISSUE PLEURAL  Final   Special Requests PLEURAL PEEL  Final   Gram Stain   Final    ABUNDANT WBC PRESENT, PREDOMINANTLY PMN RARE GRAM POSITIVE COCCI IN PAIRS    Culture PENDING  Incomplete   Report Status PENDING  Incomplete    Radiology Reports Dg Chest 2 View  Result Date: 12/28/2016 CLINICAL DATA:  Dyspnea and left-sided chest pain tonight. EXAM: CHEST  2 VIEW COMPARISON:  05/08/2008 FINDINGS: Dense consolidation and effusion in the left base, consistent with pneumonia. The right lung is clear. The pulmonary vasculature is normal. Heart size is normal. IMPRESSION: Left base consolidation and left pleural effusion, likely pneumonia. Followup PA and lateral chest X-ray is recommended in 3-4 weeks following trial of antibiotic therapy to ensure resolution and exclude underlying malignancy. Electronically Signed   By: Andreas Newport M.D.   On: 12/28/2016 23:43   Ct Angio Chest Pe W Or Wo Contrast  Result Date: 12/29/2016 CLINICAL DATA:  Left-sided chest pain and shortness of breath beginning 2 weeks ago. EXAM: CT ANGIOGRAPHY CHEST WITH CONTRAST TECHNIQUE: Multidetector CT imaging of the chest was performed using the standard protocol during bolus administration of intravenous contrast. Multiplanar CT image reconstructions and MIPs were obtained to evaluate the  vascular anatomy. CONTRAST:  100 cc Isovue 370 COMPARISON:  Chest radiography yesterday. FINDINGS: Cardiovascular: Pulmonary arterial opacification is good. There are no pulmonary emboli. Very small amount of pericardial fluid. There is coronary artery atherosclerosis and aortic atherosclerosis. Mediastinum/Nodes: No mediastinal mass or lymphadenopathy. Lungs/Pleura: Large left effusion layering dependently, probably larger than was seen yesterday. Complete collapse of left lower lobe and partial collapse of the left upper lobe. Small dependent effusion on the right with  dependent volume loss in the right lower lobe. Upper Abdomen: Negative Musculoskeletal: Ordinary chronic degenerative changes affect the spine. Review of the MIP images confirms the above findings. IMPRESSION: No pulmonary emboli. Large left effusion, probably larger than was seen yesterday. Complete collapse of the left lower lobe and partial collapse of left upper lobe. Small right effusion with dependent volume loss in the right lower lobe. Aortic and coronary artery atherosclerosis. Electronically Signed   By: Nelson Chimes M.D.   On: 12/29/2016 09:34   Dg Chest Port 1 View  Result Date: 12/31/2016 CLINICAL DATA:  Empyema, followup EXAM: PORTABLE CHEST 1 VIEW COMPARISON:  Portable chest x-ray of 12/30/2016 FINDINGS: The pedis loculated fluid/empyema has been evacuated and left chest tubes remain. There is pleural-parenchymal opacity at the left lung base consistent with small amount of fluid and atelectasis. Mild basilar atelectasis is noted on the right. Right IJ central venous catheter tip overlies the lower SVC. Heart size is stable. IMPRESSION: 1. Drainage of left pleural fluid collection representing either loculated fluid or empyema with chest tubes remaining. 2. Opacity at the left lung base most likely postoperative with atelectasis and probable small amount of fluid. Electronically Signed   By: Ivar Drape M.D.   On: 12/31/2016 15:12   Dg Chest Port 1 View  Result Date: 12/30/2016 CLINICAL DATA:  83 y/o  M; status post thoracentesis. EXAM: PORTABLE CHEST 1 VIEW COMPARISON:  12/29/2016 CT chest.  12/28/2016 chest radiograph FINDINGS: Large left pleural effusion with left lower lobe and partial left upper lobe compressive atelectasis grossly stable from prior chest CT localizer. Small right pleural effusion. Clear right lung. Cardiac silhouette is obscured by the pleural effusions. General changes of the spine. No acute osseous abnormality identified. No pneumothorax identified. IMPRESSION: Large  left pleural effusion and left lung partial atelectasis is stable from prior CT given differences in technique. Small right pleural effusion. Electronically Signed   By: Kristine Garbe M.D.   On: 12/30/2016 14:37    Lab Data:  CBC:  Recent Labs Lab 12/29/16 0000 12/30/16 0648 12/31/16 0438 01/01/17 0429  WBC 22.3* 21.1* 20.6* 29.2*  NEUTROABS 20.1*  --   --   --   HGB 12.0* 12.6* 12.5* 10.5*  HCT 37.1* 38.8* 38.8* 32.1*  MCV 100.0 99.7 100.0 98.8  PLT 541* 499* 504* 0000000*   Basic Metabolic Panel:  Recent Labs Lab 12/29/16 0000 12/31/16 0438 01/01/17 0429  NA 136 134* 133*  K 4.2 5.0 4.7  CL 103 100* 97*  CO2 22 28 25   GLUCOSE 158* 114* 150*  BUN 14 13 17   CREATININE 1.09 0.98 0.97  CALCIUM 8.3* 8.2* 7.8*   GFR: Estimated Creatinine Clearance: 58.4 mL/min (by C-G formula based on SCr of 0.97 mg/dL). Liver Function Tests:  Recent Labs Lab 12/29/16 0000 12/30/16 1235  AST 22  --   ALT 25  --   ALKPHOS 55  --   BILITOT 0.6  --   PROT 6.4* 6.5  ALBUMIN 2.9*  --    No  results for input(s): LIPASE, AMYLASE in the last 168 hours. No results for input(s): AMMONIA in the last 168 hours. Coagulation Profile:  Recent Labs Lab 12/29/16 1455  INR 1.24   Cardiac Enzymes:  Recent Labs Lab 12/29/16 0246 12/29/16 0605 12/29/16 0828  TROPONINI <0.03 <0.03 <0.03   BNP (last 3 results) No results for input(s): PROBNP in the last 8760 hours. HbA1C: No results for input(s): HGBA1C in the last 72 hours. CBG:  Recent Labs Lab 12/31/16 2041 12/31/16 2352 01/01/17 0357  GLUCAP 122* 139* 134*   Lipid Profile: No results for input(s): CHOL, HDL, LDLCALC, TRIG, CHOLHDL, LDLDIRECT in the last 72 hours. Thyroid Function Tests: No results for input(s): TSH, T4TOTAL, FREET4, T3FREE, THYROIDAB in the last 72 hours. Anemia Panel: No results for input(s): VITAMINB12, FOLATE, FERRITIN, TIBC, IRON, RETICCTPCT in the last 72 hours. Urine analysis: No results  found for: COLORURINE, APPEARANCEUR, LABSPEC, PHURINE, GLUCOSEU, HGBUR, BILIRUBINUR, KETONESUR, PROTEINUR, UROBILINOGEN, NITRITE, Corliss Skains M.D. [facility] 01/01/2017, 7:33 AM  Pager: [phone] Between 7am to 7pm - call Pager - [phone]  After 7pm go to [email or web address] - password [email or web address]  Call night coverage person covering after 7pm

## 2017-01-01 NOTE — Care Management Note (Signed)
Case Management Note  Patient Details  Name: Joseph Roman MRN: [id number] Date of Birth: 06/21/1930  Subjective/Objective:      S/p VATS              Action/Plan:  PTA independent from home.  Pt ambulating around unit when CM went to assess.  CM will continue to follow for discharge needs.   Expected Discharge Date:                  Expected Discharge Plan:  Home/Self Care  In-House Referral:     Discharge planning Services  CM Consult  Post Acute Care Choice:    Choice offered to:     DME Arranged:    DME Agency:     HH Arranged:    HH Agency:     Status of Service:  In process, will continue to follow  If discussed at Long Length of Stay Meetings, dates discussed:    Additional Comments:  Maryclare Labrador, RN 01/01/2017, 4:09 PM

## 2017-01-01 NOTE — Progress Notes (Signed)
Pharmacy Antibiotic Note  Joseph Roman is a 83 y.o. male admitted on 12/28/2016 with pneumonia.  Pharmacy has been re-consulted for cefepime and vancomycin dosing for empyema/PNA.  S/p VATS and drainage L pleural effusion/empyema 2/1. WBC 29.2, creat 0.97, AF.   Plan: Vancomycin 750 mg IV every 12 hours.  Goal trough 15-20 mcg/mL. Cefepime 2 gm q12 F/u renal fxn, wbc, temp, culture data Vancomycin trough at steady-state  Height: 5' 10.5" (179.1 cm) Weight: 171 lb 11.8 oz (77.9 kg) IBW/kg (Calculated) : 74.15  Temp (24hrs), Avg:98 F (36.7 C), Min:97.5 F (36.4 C), Max:98.4 F (36.9 C)   Recent Labs Lab 12/29/16 0000 12/29/16 0246 12/30/16 0648 12/31/16 0438 01/01/17 0429  WBC 22.3*  --  21.1* 20.6* 29.2*  CREATININE 1.09  --   --  0.98 0.97  LATICACIDVEN  --  1.4  --   --   --     Estimated Creatinine Clearance: 58.4 mL/min (by C-G formula based on SCr of 0.97 mg/dL).    Allergies  Allergen Reactions  . Other     Beta blockers-bradycardia    Antimicrobials this admission: Cefepime 1/30>>  Vanc 1/30>>  Dose adjustments this admission: abx re-ordered 2/2   Culture data:  1/30 BCx2: ntd 1/31 pleural fluid>>ngtd 2/1 pleural left lung>> 2/1 pleural tissue  > rare GPCocci in pairs 2/1 MRSA PCR neg  Eudelia Bunch, Pharm.D. BP:[phone] 01/01/2017 8:24 AM

## 2017-01-02 ENCOUNTER — Inpatient Hospital Stay (HOSPITAL_COMMUNITY): Payer: Medicare Other

## 2017-01-02 LAB — BODY FLUID CULTURE: Culture: NO GROWTH

## 2017-01-02 LAB — COMPREHENSIVE METABOLIC PANEL
ALBUMIN: 1.7 g/dL — AB (ref 3.5–5.0)
ALK PHOS: 69 U/L (ref 38–126)
ALT: 37 U/L (ref 17–63)
ANION GAP: 5 (ref 5–15)
AST: 34 U/L (ref 15–41)
BUN: 13 mg/dL (ref 6–20)
CALCIUM: 7.8 mg/dL — AB (ref 8.9–10.3)
CO2: 28 mmol/L (ref 22–32)
Chloride: 103 mmol/L (ref 101–111)
Creatinine, Ser: 0.83 mg/dL (ref 0.61–1.24)
GFR calc Af Amer: >60 mL/min (ref >60)
GFR calc non Af Amer: >60 mL/min (ref >60)
GLUCOSE: 110 mg/dL — AB (ref 65–99)
Potassium: 4.4 mmol/L (ref 3.5–5.1)
SODIUM: 136 mmol/L (ref 135–145)
Total Bilirubin: 0.5 mg/dL (ref 0.3–1.2)
Total Protein: 5.2 g/dL — ABNORMAL LOW (ref 6.5–8.1)

## 2017-01-02 LAB — CBC
HCT: 27.8 % — ABNORMAL LOW (ref 39.0–52.0)
HEMOGLOBIN: 9.2 g/dL — AB (ref 13.0–17.0)
MCH: 32.5 pg (ref 26.0–34.0)
MCHC: 33.1 g/dL (ref 30.0–36.0)
MCV: 98.2 fL (ref 78.0–100.0)
PLATELETS: 508 10*3/uL — AB (ref 150–400)
RBC: 2.83 MIL/uL — AB (ref 4.22–5.81)
RDW: 13.1 % (ref 11.5–15.5)
WBC: 19.5 10*3/uL — ABNORMAL HIGH (ref 4.0–10.5)

## 2017-01-02 MED ORDER — PIPERACILLIN-TAZOBACTAM 3.375 G IVPB
3.3750 g | Freq: Three times a day (TID) | INTRAVENOUS | Status: DC
  Administered 2017-01-02 – 2017-01-04 (×6): 3.375 g via INTRAVENOUS
  Filled 2017-01-02 (×7): qty 50

## 2017-01-02 NOTE — Progress Notes (Signed)
2 Days Post-Op Procedure(s) (LRB): VIDEO ASSISTED THORACOSCOPY (Left) DRAINAGE OF PLEURAL EFFUSION/empyema (Left) Subjective: Getting better Tube drainage  Much less Operative cultures >>> strep intermedius, anerobe in viridans family -- chg to Zosyn and stop maxepime  Objective: Vital signs in last 24 hours: Temp:  [98.3 F (36.8 C)-98.5 F (36.9 C)] 98.5 F (36.9 C) (02/03 0700) Pulse Rate:  [61-85] 70 (02/03 1000) Cardiac Rhythm: Normal sinus rhythm (02/03 1000) Resp:  [15-27] 22 (02/03 1000) BP: (90-126)/(37-66) 117/56 (02/03 1000) SpO2:  [92 %-100 %] 97 % (02/03 1000)  Hemodynamic parameters for last 24 hours: afeb    Intake/Output from previous day: 02/02 0701 - 02/03 0700 In: 1599.2 [I.V.:1199.2; IV Piggyback:400] Out: 1610 [Urine:1480; Chest Tube:130] Intake/Output this shift: Total I/O In: 350 [I.V.:150; IV Piggyback:200] Out: -   No airleak   Lab Results:  Recent Labs  01/01/17 0429 01/02/17 0445  WBC 29.2* 19.5*  HGB 10.5* 9.2*  HCT 32.1* 27.8*  PLT 526* 508*   BMET:  Recent Labs  01/01/17 0429 01/02/17 0445  NA 133* 136  K 4.7 4.4  CL 97* 103  CO2 25 28  GLUCOSE 150* 110*  BUN 17 13  CREATININE 0.97 0.83  CALCIUM 7.8* 7.8*    PT/INR: No results for input(s): LABPROT, INR in the last 72 hours. ABG    Component Value Date/Time   PHART 7.413 01/01/2017 0426   HCO3 26.7 01/01/2017 0426   TCO2 28 01/01/2017 0426   ACIDBASEDEF 1.0 05/07/2008 1051   O2SAT 96.0 01/01/2017 0426   CBG (last 3)   Recent Labs  01/01/17 1211 01/01/17 1624 01/01/17 1954  GLUCAP 153* 159* 157*    Assessment/Plan: S/P Procedure(s) (LRB): VIDEO ASSISTED THORACOSCOPY (Left) DRAINAGE OF PLEURAL EFFUSION/empyema (Left) tx  2west DC ant chest tube Cont PCA   LOS: 4 days    Tharon Aquas Trigt III 01/02/2017

## 2017-01-02 NOTE — Progress Notes (Signed)
[facility]                                                                              Patient Demographics  Joseph Roman, is a 83 y.o. male, DOB - January 25, 1930, [id number]  Admit date - 12/28/2016   Admitting Physician Norval Morton, MD  Outpatient Primary MD for the patient is [name], MD  Outpatient specialists:   LOS - 4  days    Chief Complaint  Patient presents with  . Chest Pain       Brief summary   Brief Narrative   83 year old male with history of CAD status post PCI, hypothyroidism, hyperlipidemia and GERD presented with left-sided chest pain of one-day duration. In the past 2 weeks he saw his PCP for flulike symptoms and was treated with 5 day course of Tamiflu, however the flu test was negative. He then saw his PCP 4 days prior to admission with persistent cough and fever at home. A chest x-ray done showed pneumonia and will was started on Levaquin. However symptoms did not improve. He took nitroglycerin and 4 baby aspirin concerned about his chest pain and then called EMS. In the ED he was afebrile but tachypneic and O2 sat of 90% on room air. Blood work showed significant leukocytosis (WBC of 22K), elevated platelets, normal EKG and negative troponin. Chest x-ray showed left lower lobe consolidation with effusion. Blood cultures sent and received empiric vancomycin and Zosyn. He had mildly elevated d-dimer so a CT angiogram of the chest was done which was negative for PE but showed a large layering left lower lobe effusion with near complete collapse of the left lower lobe and part of left upper lobe.   Assessment & Plan   Principal problem Sepsis ([place]) With HCAP, large left-sided pleural effusion/ Empyema - s/p VATS  Pod # 2, management per [facility] - Secondary to left lobar pneumonia with effusion.  - CT chest finding large layering left pleural effusion with near complete collapse of the left lower lobe, parapneumonic effusion versus  hemothorax. - Blood cultures negative so far - pleural fluid cultures with the strept intermedius, antibiotics changed to Zosyn  Active problems Acute hypoxic respiratory failure - secondary to #1, s/p VATS, recheck home O2 eval prior to dc  Large left pleural effusion, empyema - Thoracentesis attempted on 1/31, recommended CT surgery with VATS - s/p VATS and chest tube   Chest pain - Secondary to pleurisy. Supportive care with Tylenol and PRN Toradol. - currently on fentanyl PCA - added bowel regimen  CAD with history of PCI - Continue aspirin Continue statin - Plavix on hold  Hypothyroidism  continue Synthroid.  Code Status: full  DVT Prophylaxis:  SCD's  Family Communication: Discussed in detail with the patient, all imaging results, lab results explained to the patient   Disposition Plan:   Time Spent in minutes  25 minutes  Procedures:  VATS 2/1 Thoracentesis 1/31  Consultants:   PCCM CT surgery  Antimicrobials:   IV vancomycin  IV cefepime   IV Zosyn 2/3>   Medications  Scheduled Meds: . acetaminophen  1,000 mg Oral Q6H  Or  . acetaminophen (TYLENOL) oral liquid 160 mg/5 mL  1,000 mg Oral Q6H  . bisacodyl  10 mg Oral Daily  . fentaNYL   Intravenous Q4H  . levothyroxine  112 mcg Oral QAC breakfast  . mouth rinse  15 mL Mouth Rinse BID  . piperacillin-tazobactam (ZOSYN)  IV  3.375 g Intravenous Q8H  . pravastatin  10 mg Oral q1800  . senna-docusate  1 tablet Oral BID  . vitamin B-12  1,000 mcg Oral Daily   Continuous Infusions: . dextrose 5 % and 0.9% NaCl 10 mL/hr at 01/02/17 1221   PRN Meds:.diphenhydrAMINE **OR** diphenhydrAMINE, ipratropium-albuterol, ketorolac, naloxone **AND** sodium chloride flush, ondansetron (ZOFRAN) IV, oxyCODONE, polyethylene glycol, potassium chloride (KCL MULTIRUN) 30 mEq in 265 mL IVPB, traMADol   Antibiotics   Anti-infectives    Start     Dose/Rate Route Frequency Ordered Stop   01/02/17 1400   piperacillin-tazobactam (ZOSYN) IVPB 3.375 g     3.375 g 12.5 mL/hr over 240 Minutes Intravenous Every 8 hours 01/02/17 1118     01/01/17 1000  vancomycin (VANCOCIN) IVPB 750 mg/150 ml premix  Status:  Discontinued     750 mg 150 mL/hr over 60 Minutes Intravenous Every 12 hours 01/01/17 0817 01/02/17 1233   01/01/17 0800  ceFEPIme (MAXIPIME) 2 g in dextrose 5 % 50 mL IVPB  Status:  Discontinued     2 g 100 mL/hr over 30 Minutes Intravenous Every 12 hours 01/01/17 0747 01/02/17 1118   12/31/16 1230  ceFEPIme (MAXIPIME) 2 g in dextrose 5 % 50 mL IVPB  Status:  Discontinued     2 g 100 mL/hr over 30 Minutes Intravenous  Once 12/31/16 1217 12/31/16 2024   12/29/16 1200  vancomycin (VANCOCIN) IVPB 750 mg/150 ml premix  Status:  Discontinued     750 mg 150 mL/hr over 60 Minutes Intravenous Every 12 hours 12/29/16 0247 12/31/16 2024   12/29/16 1000  ceFEPIme (MAXIPIME) 2 g in dextrose 5 % 50 mL IVPB  Status:  Discontinued     2 g 100 mL/hr over 30 Minutes Intravenous Every 12 hours 12/29/16 0247 12/31/16 2024   12/29/16 0000  vancomycin (VANCOCIN) 1,500 mg in sodium chloride 0.9 % 500 mL IVPB     1,500 mg 250 mL/hr over 120 Minutes Intravenous  Once 12/29/16 0000 12/29/16 0252   12/29/16 0000  ceFEPIme (MAXIPIME) 2 g in dextrose 5 % 50 mL IVPB     2 g 100 mL/hr over 30 Minutes Intravenous  Once 12/29/16 0000 12/29/16 0057        Subjective:   Joseph Roman was seen and examined today. Pain controlled on fentanyl PCA, chest tube +.  No fevers. Patient denies dizziness, abdominal pain, N/V/D/C, new weakness, numbess, tingling.   Objective:   Vitals:   01/02/17 0800 01/02/17 0900 01/02/17 1000 01/02/17 1100  BP: (!) 103/52 (!) 100/59 (!) 117/56   Pulse: 70 80 70   Resp: 16 (!) 25 (!) 22   Temp:    98.3 F (36.8 C)  TempSrc:    Oral  SpO2: 95% 94% 97%   Weight:      Height:        Intake/Output Summary (Last 24 hours) at 01/02/17 1258 Last data filed at 01/02/17 1000  Gross  per 24 hour  Intake             1450 ml  Output             1460 ml  Net              -10 ml     Wt Readings from Last 3 Encounters:  12/29/16 77.9 kg (171 lb 11.8 oz)  06/05/16 77.9 kg (171 lb 12.8 oz)  06/20/15 79.5 kg (175 lb 3.2 oz)     Exam  General: Alert and oriented x 3, NAD,   HEENT:    Neck:   Cardiovascular: S1 S2 clear, RRR  Respiratory: Decreased breath sounds on the left, chest tube +.    Gastrointestinal: Soft, NT, NBS  Ext: no cyanosis clubbing or edema  Neuro: No new deficits  Skin: No rashes  Psych: Normal affect and demeanor, alert and oriented x3    Data Reviewed:  I have personally reviewed following labs and imaging studies  Micro Results Recent Results (from the past 240 hour(s))  Culture, blood (Routine X 2) w Reflex to ID Panel     Status: None (Preliminary result)   Collection Time: 12/29/16 12:00 AM  Result Value Ref Range Status   Specimen Description BLOOD RIGHT ARM  Final   Special Requests BOTTLES DRAWN AEROBIC AND ANAEROBIC 5ML  Final   Culture NO GROWTH 4 DAYS  Final   Report Status PENDING  Incomplete  Culture, blood (Routine X 2) w Reflex to ID Panel     Status: None (Preliminary result)   Collection Time: 12/29/16 12:18 AM  Result Value Ref Range Status   Specimen Description BLOOD RIGHT HAND  Final   Special Requests BOTTLES DRAWN AEROBIC AND ANAEROBIC 5ML  Final   Culture NO GROWTH 4 DAYS  Final   Report Status PENDING  Incomplete  Body fluid culture     Status: None (Preliminary result)   Collection Time: 12/30/16 12:02 PM  Result Value Ref Range Status   Specimen Description PLEURAL  Final   Special Requests NONE  Final   Gram Stain   Final    ABUNDANT WBC PRESENT, PREDOMINANTLY PMN NO ORGANISMS SEEN    Culture NO GROWTH 3 DAYS  Final   Report Status PENDING  Incomplete  Surgical pcr screen     Status: None   Collection Time: 12/30/16 11:52 PM  Result Value Ref Range Status   MRSA, PCR NEGATIVE NEGATIVE Final    Staphylococcus aureus NEGATIVE NEGATIVE Final    Comment:        The Xpert SA Assay (FDA approved for NASAL specimens in patients over 13 years of age), is one component of a comprehensive surveillance program.  Test performance has been validated by [facility] for patients greater than or equal to 53 year old. It is not intended to diagnose infection nor to guide or monitor treatment.   Acid Fast Smear (AFB)     Status: None   Collection Time: 12/31/16 12:59 PM  Result Value Ref Range Status   AFB Specimen Processing Concentration  Final   Acid Fast Smear Negative  Final    Comment: (NOTE) Performed At: [facility] [place], [place] [id number] Lindon Romp MD [id number]    Source (AFB) PLEURAL  Final    Comment: FLUID LEFT   Aerobic Culture (superficial specimen)     Status: None (Preliminary result)   Collection Time: 12/31/16  1:16 PM  Result Value Ref Range Status   Specimen Description TISSUE PLEURAL  Final   Special Requests PLEURAL PEEL  Final   Gram Stain   Final    ABUNDANT WBC PRESENT, PREDOMINANTLY PMN RARE GRAM  POSITIVE COCCI IN PAIRS    Culture   Final    FEW STREPTOCOCCUS INTERMEDIUS SUSCEPTIBILITIES TO FOLLOW    Report Status PENDING  Incomplete    Radiology Reports Dg Chest 2 View  Result Date: 12/28/2016 CLINICAL DATA:  Dyspnea and left-sided chest pain tonight. EXAM: CHEST  2 VIEW COMPARISON:  05/08/2008 FINDINGS: Dense consolidation and effusion in the left base, consistent with pneumonia. The right lung is clear. The pulmonary vasculature is normal. Heart size is normal. IMPRESSION: Left base consolidation and left pleural effusion, likely pneumonia. Followup PA and lateral chest X-ray is recommended in 3-4 weeks following trial of antibiotic therapy to ensure resolution and exclude underlying malignancy. Electronically Signed   By: Andreas Newport M.D.   On: 12/28/2016 23:43   Ct Angio Chest Pe W Or Wo  Contrast  Result Date: 12/29/2016 CLINICAL DATA:  Left-sided chest pain and shortness of breath beginning 2 weeks ago. EXAM: CT ANGIOGRAPHY CHEST WITH CONTRAST TECHNIQUE: Multidetector CT imaging of the chest was performed using the standard protocol during bolus administration of intravenous contrast. Multiplanar CT image reconstructions and MIPs were obtained to evaluate the vascular anatomy. CONTRAST:  100 cc Isovue 370 COMPARISON:  Chest radiography yesterday. FINDINGS: Cardiovascular: Pulmonary arterial opacification is good. There are no pulmonary emboli. Very small amount of pericardial fluid. There is coronary artery atherosclerosis and aortic atherosclerosis. Mediastinum/Nodes: No mediastinal mass or lymphadenopathy. Lungs/Pleura: Large left effusion layering dependently, probably larger than was seen yesterday. Complete collapse of left lower lobe and partial collapse of the left upper lobe. Small dependent effusion on the right with dependent volume loss in the right lower lobe. Upper Abdomen: Negative Musculoskeletal: Ordinary chronic degenerative changes affect the spine. Review of the MIP images confirms the above findings. IMPRESSION: No pulmonary emboli. Large left effusion, probably larger than was seen yesterday. Complete collapse of the left lower lobe and partial collapse of left upper lobe. Small right effusion with dependent volume loss in the right lower lobe. Aortic and coronary artery atherosclerosis. Electronically Signed   By: Nelson Chimes M.D.   On: 12/29/2016 09:34   Dg Chest Port 1 View  Result Date: 01/02/2017 CLINICAL DATA:  Empyema and pneumonia. EXAM: PORTABLE CHEST 1 VIEW COMPARISON:  01/01/2017 FINDINGS: Stable left-sided chest tubes. No definite pneumothorax. The right IJ catheter is stable. Assistant loss of volume in the left hemithorax with edema and atelectasis. Persistent minimal right basilar atelectasis. IMPRESSION: Stable support apparatus. Stable appearance of the  heart and lungs. Electronically Signed   By: Marijo Sanes M.D.   On: 01/02/2017 09:06   Dg Chest Port 1 View  Result Date: 01/01/2017 CLINICAL DATA:  Chest tube. EXAM: PORTABLE CHEST 1 VIEW COMPARISON:  12/31/2016 . FINDINGS: Right IJ line, left chest tubes in stable position. No pneumothorax. Stable left pleural thickening. Mild right base subsegmental atelectasis. Improving left base atelectasis. Developing opacity noted over the left mid and upper lung. This could represent a focal infiltrate. This could represent a focal loculated pleural fluid collection. PA and lateral chest x-ray suggested for further evaluation. IMPRESSION: 1. Right IJ line and left chest tubes in stable position. No pneumothorax. Stable left pleural thickening. 2. Mild right base subsegmental atelectasis. Improving left base atelectasis/ infiltrate. 3. Developing opacity over the left mid and upper chest. This could represent a focal infiltrate. This could represent a focal loculated pleural fluid collection. A PA and lateral chest x-ray suggested for further evaluation . Electronically Signed   By: Marcello Moores  Register  On: 01/01/2017 07:52   Dg Chest Port 1 View  Result Date: 12/31/2016 CLINICAL DATA:  Empyema, followup EXAM: PORTABLE CHEST 1 VIEW COMPARISON:  Portable chest x-ray of 12/30/2016 FINDINGS: The pedis loculated fluid/empyema has been evacuated and left chest tubes remain. There is pleural-parenchymal opacity at the left lung base consistent with small amount of fluid and atelectasis. Mild basilar atelectasis is noted on the right. Right IJ central venous catheter tip overlies the lower SVC. Heart size is stable. IMPRESSION: 1. Drainage of left pleural fluid collection representing either loculated fluid or empyema with chest tubes remaining. 2. Opacity at the left lung base most likely postoperative with atelectasis and probable small amount of fluid. Electronically Signed   By: Ivar Drape M.D.   On: 12/31/2016 15:12    Dg Chest Port 1 View  Result Date: 12/30/2016 CLINICAL DATA:  83 y/o  M; status post thoracentesis. EXAM: PORTABLE CHEST 1 VIEW COMPARISON:  12/29/2016 CT chest.  12/28/2016 chest radiograph FINDINGS: Large left pleural effusion with left lower lobe and partial left upper lobe compressive atelectasis grossly stable from prior chest CT localizer. Small right pleural effusion. Clear right lung. Cardiac silhouette is obscured by the pleural effusions. General changes of the spine. No acute osseous abnormality identified. No pneumothorax identified. IMPRESSION: Large left pleural effusion and left lung partial atelectasis is stable from prior CT given differences in technique. Small right pleural effusion. Electronically Signed   By: Kristine Garbe M.D.   On: 12/30/2016 14:37    Lab Data:  CBC:  Recent Labs Lab 12/29/16 0000 12/30/16 0648 12/31/16 0438 01/01/17 0429 01/02/17 0445  WBC 22.3* 21.1* 20.6* 29.2* 19.5*  NEUTROABS 20.1*  --   --   --   --   HGB 12.0* 12.6* 12.5* 10.5* 9.2*  HCT 37.1* 38.8* 38.8* 32.1* 27.8*  MCV 100.0 99.7 100.0 98.8 98.2  PLT 541* 499* 504* 526* [id number]*   Basic Metabolic Panel:  Recent Labs Lab 12/29/16 0000 12/31/16 0438 01/01/17 0429 01/02/17 0445  NA 136 134* 133* 136  K 4.2 5.0 4.7 4.4  CL 103 100* 97* 103  CO2 22 28 25 28   GLUCOSE 158* 114* 150* 110*  BUN 14 13 17 13   CREATININE 1.09 0.98 0.97 0.83  CALCIUM 8.3* 8.2* 7.8* 7.8*   GFR: Estimated Creatinine Clearance: 68.3 mL/min (by C-G formula based on SCr of 0.83 mg/dL). Liver Function Tests:  Recent Labs Lab 12/29/16 0000 12/30/16 1235 01/02/17 0445  AST 22  --  34  ALT 25  --  37  ALKPHOS 55  --  69  BILITOT 0.6  --  0.5  PROT 6.4* 6.5 5.2*  ALBUMIN 2.9*  --  1.7*   No results for input(s): LIPASE, AMYLASE in the last 168 hours. No results for input(s): AMMONIA in the last 168 hours. Coagulation Profile:  Recent Labs Lab 12/29/16 1455  INR 1.24   Cardiac  Enzymes:  Recent Labs Lab 12/29/16 0246 12/29/16 0605 12/29/16 0828  TROPONINI <0.03 <0.03 <0.03   BNP (last 3 results) No results for input(s): PROBNP in the last 8760 hours. HbA1C: No results for input(s): HGBA1C in the last 72 hours. CBG:  Recent Labs Lab 01/01/17 0357 01/01/17 0828 01/01/17 1211 01/01/17 1624 01/01/17 1954  GLUCAP 134* 150* 153* 159* 157*   Lipid Profile: No results for input(s): CHOL, HDL, LDLCALC, TRIG, CHOLHDL, LDLDIRECT in the last 72 hours. Thyroid Function Tests: No results for input(s): TSH, T4TOTAL, FREET4, T3FREE, THYROIDAB in the last  72 hours. Anemia Panel: No results for input(s): VITAMINB12, FOLATE, FERRITIN, TIBC, IRON, RETICCTPCT in the last 72 hours. Urine analysis: No results found for: COLORURINE, APPEARANCEUR, LABSPEC, PHURINE, GLUCOSEU, HGBUR, BILIRUBINUR, KETONESUR, PROTEINUR, UROBILINOGEN, NITRITE, Corliss Skains M.D. [facility] 01/02/2017, 12:58 PM  Pager: [phone] Between 7am to 7pm - call Pager - [phone]  After 7pm go to [email or web address] - password [email or web address]  Call night coverage person covering after 7pm

## 2017-01-03 ENCOUNTER — Inpatient Hospital Stay (HOSPITAL_COMMUNITY): Payer: Medicare Other

## 2017-01-03 LAB — BASIC METABOLIC PANEL
Anion gap: 7 (ref 5–15)
BUN: 14 mg/dL (ref 6–20)
CO2: 27 mmol/L (ref 22–32)
Calcium: 7.7 mg/dL — ABNORMAL LOW (ref 8.9–10.3)
Chloride: 102 mmol/L (ref 101–111)
Creatinine, Ser: 0.86 mg/dL (ref 0.61–1.24)
GFR calc Af Amer: >60 mL/min (ref >60)
GFR calc non Af Amer: >60 mL/min (ref >60)
Glucose, Bld: 106 mg/dL — ABNORMAL HIGH (ref 65–99)
Potassium: 3.9 mmol/L (ref 3.5–5.1)
Sodium: 136 mmol/L (ref 135–145)

## 2017-01-03 LAB — AEROBIC CULTURE  (SUPERFICIAL SPECIMEN)

## 2017-01-03 LAB — AEROBIC CULTURE W GRAM STAIN (SUPERFICIAL SPECIMEN)

## 2017-01-03 LAB — CBC
HCT: 26.2 % — ABNORMAL LOW (ref 39.0–52.0)
Hemoglobin: 8.5 g/dL — ABNORMAL LOW (ref 13.0–17.0)
MCH: 31.7 pg (ref 26.0–34.0)
MCHC: 32.4 g/dL (ref 30.0–36.0)
MCV: 97.8 fL (ref 78.0–100.0)
Platelets: 544 10*3/uL — ABNORMAL HIGH (ref 150–400)
RBC: 2.68 MIL/uL — ABNORMAL LOW (ref 4.22–5.81)
RDW: 12.8 % (ref 11.5–15.5)
WBC: 14.3 10*3/uL — ABNORMAL HIGH (ref 4.0–10.5)

## 2017-01-03 LAB — CULTURE, BLOOD (ROUTINE X 2)
Culture: NO GROWTH
Culture: NO GROWTH

## 2017-01-03 MED ORDER — MAGNESIUM CITRATE PO SOLN: 1.0000 | Freq: Once | ORAL | Status: DC

## 2017-01-03 MED ORDER — POLYETHYLENE GLYCOL 3350 17 G PO PACK: 17.0000 g | PACK | Freq: Every day | ORAL | Status: DC

## 2017-01-03 NOTE — Progress Notes (Addendum)
      [address]       [place] [address]             [phone]      3 Days Post-Op Procedure(s) (LRB): VIDEO ASSISTED THORACOSCOPY (Left) DRAINAGE OF PLEURAL EFFUSION/empyema (Left) Subjective: No issues overnight.   Objective: Vital signs in last 24 hours: Temp:  [98.3 F (36.8 C)-99.1 F (37.3 C)] 98.9 F (37.2 C) (02/04 0503) Pulse Rate:  [69-135] 69 (02/04 0503) Cardiac Rhythm: Normal sinus rhythm (02/03 1950) Resp:  [16-25] 18 (02/04 0503) BP: (100-121)/(47-62) 108/56 (02/04 0503) SpO2:  [87 %-99 %] 90 % (02/04 0503)     Intake/Output from previous day: 02/03 0701 - 02/04 0700 In: 854 [P.O.:360; I.V.:294; IV Piggyback:200] Out: 500 [Urine:500] Intake/Output this shift: No intake/output data recorded.  General appearance: alert, cooperative and no distress Heart: regular rate and rhythm, S1, S2 normal, no murmur, click, rub or gallop Lungs: diminished breath sounds in left lower lobe Abdomen: soft, non-tender; bowel sounds normal; no masses,  no organomegaly Extremities: extremities normal, atraumatic, no cyanosis or edema Wound: clean and dry  Lab Results:  Recent Labs  01/02/17 0445 01/03/17 0145  WBC 19.5* 14.3*  HGB 9.2* 8.5*  HCT 27.8* 26.2*  PLT 508* 544*   BMET:  Recent Labs  01/02/17 0445 01/03/17 0145  NA 136 136  K 4.4 3.9  CL 103 102  CO2 28 27  GLUCOSE 110* 106*  BUN 13 14  CREATININE 0.83 0.86  CALCIUM 7.8* 7.7*    PT/INR: No results for input(s): LABPROT, INR in the last 72 hours. ABG    Component Value Date/Time   PHART 7.413 01/01/2017 0426   HCO3 26.7 01/01/2017 0426   TCO2 28 01/01/2017 0426   ACIDBASEDEF 1.0 05/07/2008 1051   O2SAT 96.0 01/01/2017 0426   CBG (last 3)   Recent Labs  01/01/17 1211 01/01/17 1624 01/01/17 1954  GLUCAP 153* 159* 157*    Assessment/Plan: S/P Procedure(s) (LRB): VIDEO ASSISTED THORACOSCOPY (Left) DRAINAGE OF PLEURAL EFFUSION/empyema (Left)  1. CV-HR in  the 70s NSR. BP stable.  2. Pulm-tolerating room air with good oxygen saturation. CXR is stable.  3. Renal-creatinine 0.86, stable. OK electrolytes.  4. H and H dropped from 10.5 to 8.5 over the last 48 hours. Will trend. No clear source of bleeding 5.  Blood glucose well controlled.  6. Continue Zosyn for strep intermedius  Plan: Can likely discontinue posterior chest tube today along with PCA.    LOS: 5 days    Elgie Collard 01/03/2017  Patient examined and chest x-ray personally reviewed Minimal chest tube drainage-remove final tube today Final culture shows streptococcus intermedius--this should be covered with oral Augmentin after he finishes his IV antibiotics and hospital Tharon Aquas trigt M.D.

## 2017-01-03 NOTE — Progress Notes (Signed)
dc'ed chest tube pt. tolerated well  

## 2017-01-03 NOTE — Progress Notes (Signed)
[facility]                                                                              Patient Demographics  Joseph Roman, is a 83 y.o. male, DOB - [birth year], [id number]  Admit date - 12/28/2016   Admitting Physician Norval Morton, MD  Outpatient Primary MD for the patient is [name], MD  Outpatient specialists:   LOS - 5  days    Chief Complaint  Patient presents with  . Chest Pain       Brief summary   Brief Narrative   83 year old male with history of CAD status post PCI, hypothyroidism, hyperlipidemia and GERD presented with left-sided chest pain of one-day duration. In the past 2 weeks he saw his PCP for flulike symptoms and was treated with 5 day course of Tamiflu, however the flu test was negative. He then saw his PCP 4 days prior to admission with persistent cough and fever at home. A chest x-ray done showed pneumonia and will was started on Levaquin. However symptoms did not improve. He took nitroglycerin and 4 baby aspirin concerned about his chest pain and then called EMS. In the ED he was afebrile but tachypneic and O2 sat of 90% on room air. Blood work showed significant leukocytosis (WBC of 22K), elevated platelets, normal EKG and negative troponin. Chest x-ray showed left lower lobe consolidation with effusion. Blood cultures sent and received empiric vancomycin and Zosyn. He had mildly elevated d-dimer so a CT angiogram of the chest was done which was negative for PE but showed a large layering left lower lobe effusion with near complete collapse of the left lower lobe and part of left upper lobe.   Assessment & Plan   Principal problem Sepsis ([place]) With HCAP, large left-sided pleural effusion/ Empyema - s/p VATS  Pod # 3, management per [facility] - Secondary to left lobar pneumonia with effusion.  - CT chest finding large layering left pleural effusion with near complete collapse of the left lower lobe, parapneumonic effusion versus  hemothorax. - Blood cultures negative so far - pleural fluid cultures with the strept intermedius, antibiotics changed to Zosyn - Plan to remove posterior chest tube today  Active problems Acute hypoxic respiratory failure - secondary to #1, s/p VATS, O2 sats improved, on room air  Large left pleural effusion, empyema - Thoracentesis attempted on 1/31, recommended CT surgery with VATS - s/p VATS and chest tube   Chest pain - Secondary to pleurisy. Supportive care with Tylenol and PRN Toradol. - currently on fentanyl PCA, plan to remove PCA today - added bowel regimen  CAD with history of PCI - Continue aspirin Continue statin - Plavix on hold  Hypothyroidism  continue Synthroid.  Constipation - Continue Senokot, MiraLAX, added mag citrate  Code Status: full  DVT Prophylaxis:  SCD's  Family Communication: Discussed in detail with the patient, all imaging results, lab results explained to the patient   Disposition Plan:   Time Spent in minutes  25 minutes  Procedures:  VATS 2/1 Thoracentesis 1/31  Consultants:   PCCM CT surgery  Antimicrobials:   IV vancomycin  IV  cefepime   IV Zosyn 2/3>   Medications  Scheduled Meds: . acetaminophen  1,000 mg Oral Q6H   Or  . acetaminophen (TYLENOL) oral liquid 160 mg/5 mL  1,000 mg Oral Q6H  . bisacodyl  10 mg Oral Daily  . fentaNYL   Intravenous Q4H  . levothyroxine  112 mcg Oral QAC breakfast  . magnesium citrate  1 Bottle Oral Once  . mouth rinse  15 mL Mouth Rinse BID  . piperacillin-tazobactam (ZOSYN)  IV  3.375 g Intravenous Q8H  . pravastatin  10 mg Oral q1800  . senna-docusate  1 tablet Oral BID  . vitamin B-12  1,000 mcg Oral Daily   Continuous Infusions: . dextrose 5 % and 0.9% NaCl 10 mL/hr at 01/02/17 1500   PRN Meds:.diphenhydrAMINE **OR** diphenhydrAMINE, ipratropium-albuterol, ketorolac, naloxone **AND** sodium chloride flush, ondansetron (ZOFRAN) IV, oxyCODONE, polyethylene glycol, potassium  chloride (KCL MULTIRUN) 30 mEq in 265 mL IVPB, traMADol   Antibiotics   Anti-infectives    Start     Dose/Rate Route Frequency Ordered Stop   01/02/17 1400  piperacillin-tazobactam (ZOSYN) IVPB 3.375 g     3.375 g 12.5 mL/hr over 240 Minutes Intravenous Every 8 hours 01/02/17 1118     01/01/17 1000  vancomycin (VANCOCIN) IVPB 750 mg/150 ml premix  Status:  Discontinued     750 mg 150 mL/hr over 60 Minutes Intravenous Every 12 hours 01/01/17 0817 01/02/17 1233   01/01/17 0800  ceFEPIme (MAXIPIME) 2 g in dextrose 5 % 50 mL IVPB  Status:  Discontinued     2 g 100 mL/hr over 30 Minutes Intravenous Every 12 hours 01/01/17 0747 01/02/17 1118   12/31/16 1230  ceFEPIme (MAXIPIME) 2 g in dextrose 5 % 50 mL IVPB  Status:  Discontinued     2 g 100 mL/hr over 30 Minutes Intravenous  Once 12/31/16 1217 12/31/16 2024   12/29/16 1200  vancomycin (VANCOCIN) IVPB 750 mg/150 ml premix  Status:  Discontinued     750 mg 150 mL/hr over 60 Minutes Intravenous Every 12 hours 12/29/16 0247 12/31/16 2024   12/29/16 1000  ceFEPIme (MAXIPIME) 2 g in dextrose 5 % 50 mL IVPB  Status:  Discontinued     2 g 100 mL/hr over 30 Minutes Intravenous Every 12 hours 12/29/16 0247 12/31/16 2024   12/29/16 0000  vancomycin (VANCOCIN) 1,500 mg in sodium chloride 0.9 % 500 mL IVPB     1,500 mg 250 mL/hr over 120 Minutes Intravenous  Once 12/29/16 0000 12/29/16 0252   12/29/16 0000  ceFEPIme (MAXIPIME) 2 g in dextrose 5 % 50 mL IVPB     2 g 100 mL/hr over 30 Minutes Intravenous  Once 12/29/16 0000 12/29/16 0057        Subjective:   Joseph Roman was seen and examined today. Pain control, constipated. chest tube +.  No fevers. Patient denies dizziness, abdominal pain, N/V/D/C, new weakness, numbess, tingling.   Objective:   Vitals:   01/02/17 2000 01/03/17 0111 01/03/17 0400 01/03/17 0503  BP:    (!) 108/56  Pulse:    69  Resp: 16 16 16 18   Temp:    98.9 F (37.2 C)  TempSrc:    Oral  SpO2: 97% 99% 98% 90%    Weight:      Height:        Intake/Output Summary (Last 24 hours) at 01/03/17 0957 Last data filed at 01/02/17 1822  Gross per 24 hour  Intake  554 ml  Output              500 ml  Net               54 ml     Wt Readings from Last 3 Encounters:  12/29/16 77.9 kg (171 lb 11.8 oz)  06/05/16 77.9 kg (171 lb 12.8 oz)  06/20/15 79.5 kg (175 lb 3.2 oz)     Exam  General: Alert and oriented x 3, NAD,   HEENT:    Neck:   Cardiovascular: S1 S2 clear, RRR  Respiratory: Decreased breath sounds on the left, posterior chest tube +.    Gastrointestinal: Soft, NT, NBS  Ext: no cyanosis clubbing or edema  Neuro: No new deficits  Skin: No rashes  Psych: Normal affect and demeanor, alert and oriented x3    Data Reviewed:  I have personally reviewed following labs and imaging studies  Micro Results Recent Results (from the past 240 hour(s))  Culture, blood (Routine X 2) w Reflex to ID Panel     Status: None (Preliminary result)   Collection Time: 12/29/16 12:00 AM  Result Value Ref Range Status   Specimen Description BLOOD RIGHT ARM  Final   Special Requests BOTTLES DRAWN AEROBIC AND ANAEROBIC 5ML  Final   Culture NO GROWTH 4 DAYS  Final   Report Status PENDING  Incomplete  Culture, blood (Routine X 2) w Reflex to ID Panel     Status: None (Preliminary result)   Collection Time: 12/29/16 12:18 AM  Result Value Ref Range Status   Specimen Description BLOOD RIGHT HAND  Final   Special Requests BOTTLES DRAWN AEROBIC AND ANAEROBIC 5ML  Final   Culture NO GROWTH 4 DAYS  Final   Report Status PENDING  Incomplete  Body fluid culture     Status: None   Collection Time: 12/30/16 12:02 PM  Result Value Ref Range Status   Specimen Description PLEURAL  Final   Special Requests NONE  Final   Gram Stain   Final    ABUNDANT WBC PRESENT, PREDOMINANTLY PMN NO ORGANISMS SEEN    Culture NO GROWTH 3 DAYS  Final   Report Status 01/02/2017 FINAL  Final  Surgical pcr screen      Status: None   Collection Time: 12/30/16 11:52 PM  Result Value Ref Range Status   MRSA, PCR NEGATIVE NEGATIVE Final   Staphylococcus aureus NEGATIVE NEGATIVE Final    Comment:        The Xpert SA Assay (FDA approved for NASAL specimens in patients over 54 years of age), is one component of a comprehensive surveillance program.  Test performance has been validated by [facility] for patients greater than or equal to 34 year old. It is not intended to diagnose infection nor to guide or monitor treatment.   Fungus Culture With Stain     Status: None (Preliminary result)   Collection Time: 12/31/16 12:59 PM  Result Value Ref Range Status   Fungus Stain Final report  Final    Comment: (NOTE) Performed At: [facility] [place], [place] [id number] Lindon Romp MD [id number]    Fungus (Mycology) Culture PENDING  Incomplete   Fungal Source PLEURAL  Final    Comment: FLUID LEFT   Acid Fast Smear (AFB)     Status: None   Collection Time: 12/31/16 12:59 PM  Result Value Ref Range Status   AFB Specimen Processing Concentration  Final   Acid Fast Smear  Negative  Final    Comment: (NOTE) Performed At: [facility] [place], [place] [id number] Lindon Romp MD [id number]    Source (AFB) PLEURAL  Final    Comment: FLUID LEFT   Fungus Culture Result     Status: None   Collection Time: 12/31/16 12:59 PM  Result Value Ref Range Status   Result 1 Comment  Final    Comment: (NOTE) KOH/Calcofluor preparation:  no fungus observed. Performed At: [facility] [place], [place] [id number] Lindon Romp MD [id number]   Anaerobic culture     Status: None (Preliminary result)   Collection Time: 12/31/16  1:16 PM  Result Value Ref Range Status   Specimen Description TISSUE PLEURAL  Final   Special Requests PLEURAL PEEL  Final   Culture   Final    NO ANAEROBES ISOLATED; CULTURE IN PROGRESS FOR 5  DAYS   Report Status PENDING  Incomplete  Aerobic Culture (superficial specimen)     Status: None (Preliminary result)   Collection Time: 12/31/16  1:16 PM  Result Value Ref Range Status   Specimen Description TISSUE PLEURAL  Final   Special Requests PLEURAL PEEL  Final   Gram Stain   Final    ABUNDANT WBC PRESENT, PREDOMINANTLY PMN RARE GRAM POSITIVE COCCI IN PAIRS    Culture   Final    FEW STREPTOCOCCUS INTERMEDIUS SUSCEPTIBILITIES TO FOLLOW    Report Status PENDING  Incomplete    Radiology Reports Dg Chest 2 View  Result Date: 12/28/2016 CLINICAL DATA:  Dyspnea and left-sided chest pain tonight. EXAM: CHEST  2 VIEW COMPARISON:  05/08/2008 FINDINGS: Dense consolidation and effusion in the left base, consistent with pneumonia. The right lung is clear. The pulmonary vasculature is normal. Heart size is normal. IMPRESSION: Left base consolidation and left pleural effusion, likely pneumonia. Followup PA and lateral chest X-ray is recommended in 3-4 weeks following trial of antibiotic therapy to ensure resolution and exclude underlying malignancy. Electronically Signed   By: Andreas Newport M.D.   On: 12/28/2016 23:43   Ct Angio Chest Pe W Or Wo Contrast  Result Date: 12/29/2016 CLINICAL DATA:  Left-sided chest pain and shortness of breath beginning 2 weeks ago. EXAM: CT ANGIOGRAPHY CHEST WITH CONTRAST TECHNIQUE: Multidetector CT imaging of the chest was performed using the standard protocol during bolus administration of intravenous contrast. Multiplanar CT image reconstructions and MIPs were obtained to evaluate the vascular anatomy. CONTRAST:  100 cc Isovue 370 COMPARISON:  Chest radiography yesterday. FINDINGS: Cardiovascular: Pulmonary arterial opacification is good. There are no pulmonary emboli. Very small amount of pericardial fluid. There is coronary artery atherosclerosis and aortic atherosclerosis. Mediastinum/Nodes: No mediastinal mass or lymphadenopathy. Lungs/Pleura: Large left  effusion layering dependently, probably larger than was seen yesterday. Complete collapse of left lower lobe and partial collapse of the left upper lobe. Small dependent effusion on the right with dependent volume loss in the right lower lobe. Upper Abdomen: Negative Musculoskeletal: Ordinary chronic degenerative changes affect the spine. Review of the MIP images confirms the above findings. IMPRESSION: No pulmonary emboli. Large left effusion, probably larger than was seen yesterday. Complete collapse of the left lower lobe and partial collapse of left upper lobe. Small right effusion with dependent volume loss in the right lower lobe. Aortic and coronary artery atherosclerosis. Electronically Signed   By: Nelson Chimes M.D.   On: 12/29/2016 09:34   Dg Chest Port 1 View  Result Date: 01/03/2017 CLINICAL DATA:  Chest tube  on the left. EXAM: PORTABLE CHEST 1 VIEW COMPARISON:  January 02, 2017 FINDINGS: One of the 2 left chest tubes have been removed. The other is stable. The right central line is been removed. No pneumothorax. The right lung is clear. The cardiomediastinal silhouette is stable. Opacity throughout the left lung is similar in the interval with left-sided pleural fluid identified. IMPRESSION: 1. One of the 2 left-sided chest tubes has been removed and the other is stable. Continued left pleural effusion and opacification throughout the left lung, similar in the interval. No other significant interval changes. Removal of right central line. Electronically Signed   By: Dorise Bullion III M.D   On: 01/03/2017 07:42   Dg Chest Port 1 View  Result Date: 01/02/2017 CLINICAL DATA:  Empyema and pneumonia. EXAM: PORTABLE CHEST 1 VIEW COMPARISON:  01/01/2017 FINDINGS: Stable left-sided chest tubes. No definite pneumothorax. The right IJ catheter is stable. Assistant loss of volume in the left hemithorax with edema and atelectasis. Persistent minimal right basilar atelectasis. IMPRESSION: Stable support  apparatus. Stable appearance of the heart and lungs. Electronically Signed   By: Marijo Sanes M.D.   On: 01/02/2017 09:06   Dg Chest Port 1 View  Result Date: 01/01/2017 CLINICAL DATA:  Chest tube. EXAM: PORTABLE CHEST 1 VIEW COMPARISON:  12/31/2016 . FINDINGS: Right IJ line, left chest tubes in stable position. No pneumothorax. Stable left pleural thickening. Mild right base subsegmental atelectasis. Improving left base atelectasis. Developing opacity noted over the left mid and upper lung. This could represent a focal infiltrate. This could represent a focal loculated pleural fluid collection. PA and lateral chest x-ray suggested for further evaluation. IMPRESSION: 1. Right IJ line and left chest tubes in stable position. No pneumothorax. Stable left pleural thickening. 2. Mild right base subsegmental atelectasis. Improving left base atelectasis/ infiltrate. 3. Developing opacity over the left mid and upper chest. This could represent a focal infiltrate. This could represent a focal loculated pleural fluid collection. A PA and lateral chest x-ray suggested for further evaluation . Electronically Signed   By: Marcello Moores  Register   On: 01/01/2017 07:52   Dg Chest Port 1 View  Result Date: 12/31/2016 CLINICAL DATA:  Empyema, followup EXAM: PORTABLE CHEST 1 VIEW COMPARISON:  Portable chest x-ray of 12/30/2016 FINDINGS: The pedis loculated fluid/empyema has been evacuated and left chest tubes remain. There is pleural-parenchymal opacity at the left lung base consistent with small amount of fluid and atelectasis. Mild basilar atelectasis is noted on the right. Right IJ central venous catheter tip overlies the lower SVC. Heart size is stable. IMPRESSION: 1. Drainage of left pleural fluid collection representing either loculated fluid or empyema with chest tubes remaining. 2. Opacity at the left lung base most likely postoperative with atelectasis and probable small amount of fluid. Electronically Signed   By: Ivar Drape M.D.   On: 12/31/2016 15:12   Dg Chest Port 1 View  Result Date: 12/30/2016 CLINICAL DATA:  83 y/o  M; status post thoracentesis. EXAM: PORTABLE CHEST 1 VIEW COMPARISON:  12/29/2016 CT chest.  12/28/2016 chest radiograph FINDINGS: Large left pleural effusion with left lower lobe and partial left upper lobe compressive atelectasis grossly stable from prior chest CT localizer. Small right pleural effusion. Clear right lung. Cardiac silhouette is obscured by the pleural effusions. General changes of the spine. No acute osseous abnormality identified. No pneumothorax identified. IMPRESSION: Large left pleural effusion and left lung partial atelectasis is stable from prior CT given differences in technique. Small right  pleural effusion. Electronically Signed   By: Kristine Garbe M.D.   On: 12/30/2016 14:37    Lab Data:  CBC:  Recent Labs Lab 12/29/16 0000 12/30/16 [id number] 12/31/16 0438 01/01/17 0429 01/02/17 0445 01/03/17 0145  WBC 22.3* 21.1* 20.6* 29.2* 19.5* 14.3*  NEUTROABS 20.1*  --   --   --   --   --   HGB 12.0* 12.6* 12.5* 10.5* 9.2* 8.5*  HCT 37.1* 38.8* 38.8* 32.1* 27.8* 26.2*  MCV 100.0 99.7 100.0 98.8 98.2 97.8  PLT 541* 499* 504* 526* 508* 0000000*   Basic Metabolic Panel:  Recent Labs Lab 12/29/16 0000 12/31/16 0438 01/01/17 0429 01/02/17 0445 01/03/17 0145  NA 136 134* 133* 136 136  K 4.2 5.0 4.7 4.4 3.9  CL 103 100* 97* 103 102  CO2 22 28 25 28 27   GLUCOSE 158* 114* 150* 110* 106*  BUN 14 13 17 13 14   CREATININE 1.09 0.98 0.97 0.83 0.86  CALCIUM 8.3* 8.2* 7.8* 7.8* 7.7*   GFR: Estimated Creatinine Clearance: 65.9 mL/min (by C-G formula based on SCr of 0.86 mg/dL). Liver Function Tests:  Recent Labs Lab 12/29/16 0000 12/30/16 1235 01/02/17 0445  AST 22  --  34  ALT 25  --  37  ALKPHOS 55  --  69  BILITOT 0.6  --  0.5  PROT 6.4* 6.5 5.2*  ALBUMIN 2.9*  --  1.7*   No results for input(s): LIPASE, AMYLASE in the last 168 hours. No results  for input(s): AMMONIA in the last 168 hours. Coagulation Profile:  Recent Labs Lab 12/29/16 1455  INR 1.24   Cardiac Enzymes:  Recent Labs Lab 12/29/16 0246 12/29/16 0605 12/29/16 0828  TROPONINI <0.03 <0.03 <0.03   BNP (last 3 results) No results for input(s): PROBNP in the last 8760 hours. HbA1C: No results for input(s): HGBA1C in the last 72 hours. CBG:  Recent Labs Lab 01/01/17 0357 01/01/17 0828 01/01/17 1211 01/01/17 1624 01/01/17 1954  GLUCAP 134* 150* 153* 159* 157*   Lipid Profile: No results for input(s): CHOL, HDL, LDLCALC, TRIG, CHOLHDL, LDLDIRECT in the last 72 hours. Thyroid Function Tests: No results for input(s): TSH, T4TOTAL, FREET4, T3FREE, THYROIDAB in the last 72 hours. Anemia Panel: No results for input(s): VITAMINB12, FOLATE, FERRITIN, TIBC, IRON, RETICCTPCT in the last 72 hours. Urine analysis: No results found for: COLORURINE, APPEARANCEUR, LABSPEC, PHURINE, GLUCOSEU, HGBUR, BILIRUBINUR, KETONESUR, PROTEINUR, UROBILINOGEN, NITRITE, Corliss Skains M.D. [facility] 01/03/2017, 9:56 AM  Pager: [phone] Between 7am to 7pm - call Pager - [phone]  After 7pm go to [email or web address] - password [email or web address]  Call night coverage person covering after 7pm

## 2017-01-04 ENCOUNTER — Encounter (HOSPITAL_COMMUNITY): Payer: Self-pay

## 2017-01-04 ENCOUNTER — Inpatient Hospital Stay (HOSPITAL_COMMUNITY): Payer: Medicare Other

## 2017-01-04 DIAGNOSIS — Z978 Presence of other specified devices: Secondary | ICD-10-CM

## 2017-01-04 DIAGNOSIS — Z9689 Presence of other specified functional implants: Secondary | ICD-10-CM

## 2017-01-04 LAB — CBC
HCT: 27.5 % — ABNORMAL LOW (ref 39.0–52.0)
Hemoglobin: 9 g/dL — ABNORMAL LOW (ref 13.0–17.0)
MCH: 32.1 pg (ref 26.0–34.0)
MCHC: 32.7 g/dL (ref 30.0–36.0)
MCV: 98.2 fL (ref 78.0–100.0)
Platelets: 618 10*3/uL — ABNORMAL HIGH (ref 150–400)
RBC: 2.8 MIL/uL — ABNORMAL LOW (ref 4.22–5.81)
RDW: 13.2 % (ref 11.5–15.5)
WBC: 14.8 10*3/uL — ABNORMAL HIGH (ref 4.0–10.5)

## 2017-01-04 LAB — BASIC METABOLIC PANEL
Anion gap: 7 (ref 5–15)
BUN: 13 mg/dL (ref 6–20)
CO2: 29 mmol/L (ref 22–32)
Calcium: 7.8 mg/dL — ABNORMAL LOW (ref 8.9–10.3)
Chloride: 102 mmol/L (ref 101–111)
Creatinine, Ser: 1.02 mg/dL (ref 0.61–1.24)
GFR calc Af Amer: >60 mL/min (ref >60)
GFR calc non Af Amer: >60 mL/min (ref >60)
Glucose, Bld: 107 mg/dL — ABNORMAL HIGH (ref 65–99)
Potassium: 4.5 mmol/L (ref 3.5–5.1)
Sodium: 138 mmol/L (ref 135–145)

## 2017-01-04 MED ORDER — OXYCODONE HCL 5 MG PO TABS: 5.0000 mg | ORAL_TABLET | Freq: Four times a day (QID) | ORAL | 0 refills | Status: DC | PRN

## 2017-01-04 MED ORDER — AMOXICILLIN-POT CLAVULANATE 875-125 MG PO TABS: 1.0000 | ORAL_TABLET | Freq: Two times a day (BID) | ORAL | 0 refills | Status: DC

## 2017-01-04 NOTE — Discharge Instructions (Signed)
Community-Acquired Pneumonia, Adult Pneumonia is an infection of the lungs. There are different types of pneumonia. One type can develop while a person is in a hospital. A different type, called community-acquired pneumonia, develops in people who are not, or have not recently been, in the hospital or other health care facility. What are the causes? Pneumonia may be caused by bacteria, viruses, or funguses. Community-acquired pneumonia is often caused by Streptococcus pneumonia bacteria. These bacteria are often passed from one person to another by breathing in droplets from the cough or sneeze of an infected person. What increases the risk? The condition is more likely to develop in:  People who havechronic diseases, such as chronic obstructive pulmonary disease (COPD), asthma, congestive heart failure, cystic fibrosis, diabetes, or kidney disease.  People who haveearly-stage or late-stage HIV.  People who havesickle cell disease.  People who havehad their spleen removed (splenectomy).  People who havepoor Human resources officer.  People who havemedical conditions that increase the risk of breathing in (aspirating) secretions their own mouth and nose.  People who havea weakened immune system (immunocompromised).  People who smoke.  People whotravel to areas where pneumonia-causing germs commonly exist.  People whoare around animal habitats or animals that have pneumonia-causing germs, including birds, bats, rabbits, cats, and farm animals. What are the signs or symptoms? Symptoms of this condition include:  Adry cough.  A wet (productive) cough.  Fever.  Sweating.  Chest pain, especially when breathing deeply or coughing.  Rapid breathing or difficulty breathing.  Shortness of breath.  Shaking chills.  Fatigue.  Muscle aches. How is this diagnosed? Your health care provider will take a medical history and perform a physical exam. You may also have other tests,  including:  Imaging studies of your chest, including X-rays.  Tests to check your blood oxygen level and other blood gases.  Other tests on blood, mucus (sputum), fluid around your lungs (pleural fluid), and urine. If your pneumonia is severe, other tests may be done to identify the specific cause of your illness. How is this treated? The type of treatment that you receive depends on many factors, such as the cause of your pneumonia, the medicines you take, and other medical conditions that you have. For most adults, treatment and recovery from pneumonia may occur at home. In some cases, treatment must happen in a hospital. Treatment may include:  Antibiotic medicines, if the pneumonia was caused by bacteria.  Antiviral medicines, if the pneumonia was caused by a virus.  Medicines that are given by mouth or through an IV tube.  Oxygen.  Respiratory therapy. Although rare, treating severe pneumonia may include:  Mechanical ventilation. This is done if you are not breathing well on your own and you cannot maintain a safe blood oxygen level.  Thoracentesis. This procedureremoves fluid around one lung or both lungs to help you breathe better. Follow these instructions at home:  Take over-the-counter and prescription medicines only as told by your health care provider.  Only takecough medicine if you are losing sleep. Understand that cough medicine can prevent your bodys natural ability to remove mucus from your lungs.  If you were prescribed an antibiotic medicine, take it as told by your health care provider. Do not stop taking the antibiotic even if you start to feel better.  Sleep in a semi-upright position at night. Try sleeping in a reclining chair, or place a few pillows under your head.  Do not use tobacco products, including cigarettes, chewing tobacco, and e-cigarettes. If you  need help quitting, ask your health care provider.  Drink enough water to keep your urine clear  or pale yellow. This will help to thin out mucus secretions in your lungs. How is this prevented? There are ways that you can decrease your risk of developing community-acquired pneumonia. Consider getting a pneumococcal vaccine if:  You are older than 81 years of age.  You are older than 81 years of age and are undergoing cancer treatment, have chronic lung disease, or have other medical conditions that affect your immune system. Ask your health care provider if this applies to you. There are different types and schedules of pneumococcal vaccines. Ask your health care provider which vaccination option is best for you. You may also prevent community-acquired pneumonia if you take these actions:  Get an influenza vaccine every year. Ask your health care provider which type of influenza vaccine is best for you.  Go to the dentist on a regular basis.  Wash your hands often. Use hand sanitizer if soap and water are not available. Contact a health care provider if:  You have a fever.  You are losing sleep because you cannot control your cough with cough medicine. Get help right away if:  You have worsening shortness of breath.  You have increased chest pain.  Your sickness becomes worse, especially if you are an older adult or have a weakened immune system.  You cough up blood. This information is not intended to replace advice given to you by your health care provider. Make sure you discuss any questions you have with your health care provider. Document Released: 11/16/2005 Document Revised: 03/26/2016 Document Reviewed: 03/13/2015 Elsevier Interactive Patient Education  2017 [employer]. Thoracoscopy, Care After Refer to this sheet in the next few weeks. These instructions provide you with information about caring for yourself after your procedure. Your health care provider may also give you more specific instructions. Your treatment has been planned according to current medical  practices, but problems sometimes occur. Call your health care provider if you have any problems or questions after your procedure. What can I expect after the procedure? After your procedure, it is common to feel sore for up to two weeks. Follow these instructions at home:  There are many different ways to close and cover an incision, including stitches (sutures), skin glue, and adhesive strips. Follow your health care provider's instructions about:  Incision care.  Bandage (dressing) changes and removal.  Incision closure removal.  Check your incision area every day for signs of infection. Watch for:  Redness, swelling, or pain.  Fluid, blood, or pus.  Take medicines only as directed by your health care provider.  Try to cough often. Coughing helps to protect against lung infection (pneumonia). It may hurt to cough. If this happens, hold a pillow against your chest when you cough.  Take deep breaths. This also helps to protect against pneumonia.  If you were given an incentive spirometer, use it as directed by your health care provider.  Do not take baths, swim, or use a hot tub until your health care provider approves. You may take showers.  Avoid lifting until your health care provider approves.  Avoid driving until your health care provider approves.  Do not travel by airplane after the chest tube is removed until your health care provider approves. Contact a health care provider if:  You have a fever.  Pain medicines do not ease your pain.  You have redness, swelling, or increasing pain in  your incision area.  You develop a cough that does not go away, or you are coughing up mucus that is yellow or green. Get help right away if:  You have fluid, blood, or pus coming from your incision.  There is a bad smell coming from your incision or dressing.  You develop a rash.  You have difficulty breathing.  You cough up blood.  You develop light-headedness or you  feel faint.  You develop chest pain.  Your heartbeat feels irregular or very fast. This information is not intended to replace advice given to you by your health care provider. Make sure you discuss any questions you have with your health care provider. Document Released: 06/05/2005 Document Revised: 07/19/2016 Document Reviewed: 08/01/2014 Elsevier Interactive Patient Education  2017 [employer].

## 2017-01-04 NOTE — Discharge Summary (Signed)
Physician Discharge Summary  Patient ID: [name] MRN: [id number] DOB/AGE: 81/02/1930 81 y.o.  Admit date: 12/28/2016 Discharge date: 01/04/2017  Admission Diagnoses:Community-acquired pneumonia/left empyema  Discharge Diagnoses:  Principal Problem:   Sepsis [facility]) Active Problems:   Coronary artery disease   Dyslipidemia   Community acquired pneumonia   Leukocytosis   Hypothyroidism   Thrombocytosis (HCC)   Pleural effusion left in setting of CAP   Empyema (HCC)   S/P thoracentesis   Chest tube in place  Patient Active Problem List   Diagnosis Date Noted  . Chest tube in place   . Empyema ([place]) 12/31/2016  . S/P thoracentesis   . Community acquired pneumonia 12/29/2016  . Sepsis ([place]) 12/29/2016  . Leukocytosis 12/29/2016  . Hypothyroidism 12/29/2016  . Thrombocytosis ([place]) 12/29/2016  . Pleural effusion left in setting of CAP 12/29/2016  . Community acquired pneumonia of left lower lobe of lung ([place])   . Coronary artery disease   . Dyslipidemia    HPI: at time of consultation  The patient is an active 83 year old gentleman with a history of hyperlipidemia, hypothyroidism, CAD s/p MI and PCI with stenting of the LCX in 04/2008. He has done well without any chest pain or shortness of breath until his recent illness. He went to his PCP a couple weeks ago with flu-like symptoms and was treated with Tamiflu. He completed the course but says that he continued to have fever to 102 associated with cough and shortness of breath. A CXR showed pneumonia and he was started on Levaquin last Friday. He says that on Monday evening he started having some sharp, constant left chest pain associated with coughing which made it worse. He though it might be his heart and took NTG and 4 baby aspirin with some improvement. He called EMS and was given an additional NTG. He was found to have an oxygen sat of 90%, leukocytosis to 22.3, negative troponin and a CXR showing LLL consolidation and  pleural effusion. He had a CT scan yesterday which showed a large loculated left pleural effusion with LLL collapse consistent with pneumonia and empyema. He had a thoracentesis today removing 400 cc of cloudy fluid that is an exudate.    Discharged Condition: good  Hospital Course: The patient was admitted to the emergency department with pneumonia and possible empyema. A pulmonology consultation was obtained and as noted above a thoracentesis once done on the left showing an exudative effusion. Cardiothoracic surgical consultation was obtained with Dr. Cyndia Bent who felt he would require surgical intervention. On 12/31/2016 the patient was taken the operating room where he underwent the below described procedure. He tolerated well was taken to the postanesthesia care unit in stable condition  Postoperative hospital course:  Overall the patient has progressed nicely over time. He is maintained stable hemodynamics and sinus rhythm. Oxygen has been weaned over time. His leukocytosis is steadily improving. Final cultures have shown Streptococcus intermedius and at time of discharge he will be transitioned to oral Augmentin. Incisions noted to be healing well without evidence of infection. He is tolerating diet and gradually increasing activities using standard protocols. At time of discharge she is felt to be stable.  Consults: pulmonary/intensive care  Significant Diagnostic Studies: chest CT scan  Treatments: surgery:    Discharge Exam:1:55 PM  PATIENT:  Joseph Roman  83 y.o. male  PRE-OPERATIVE DIAGNOSIS:  Left empyema and pleural effusion  POST-OPERATIVE DIAGNOSIS:  Left empyema and pleural effusion  PROCEDURE:  LEFT VIDEO  ASSISTED THORACOSCOPY, DRAINAGE OF LEFT PLEURAL EFFUSION and EMPYEMA  SURGEON:  Surgeon(s) and Role:    * Gaye Pollack, MD - Primary  PHYSICIAN ASSISTANT: Lars Pinks PA-C  ANESTHESIA:   general  Blood pressure (!) 128/49, pulse 73,  temperature 99 F (37.2 C), temperature source Oral, resp. rate 18, height 5' 10.5" (1.791 m), weight 171 lb 4.8 oz (77.7 kg), SpO2 91 %.  General appearance: alert, cooperative and no distress Heart: regular rate and rhythm Lungs: mildly dim in left base Abdomen: benign Extremities: no edema Wound: incis healing well   Disposition: discharged home   Allergies as of 01/04/2017   No Active Allergies     Medication List    STOP taking these medications   levofloxacin 500 MG tablet Commonly known as:  LEVAQUIN     TAKE these medications   amoxicillin-clavulanate 875-125 MG tablet Commonly known as:  AUGMENTIN Take 1 tablet by mouth 2 (two) times daily.   aspirin 81 MG tablet Take 81 mg by mouth daily.   clopidogrel 75 MG tablet Commonly known as:  PLAVIX Take 1 tablet (75 mg total) by mouth daily.   levothyroxine 112 MCG tablet Commonly known as:  SYNTHROID, LEVOTHROID Take 112 mcg by mouth daily.   nitroGLYCERIN 0.4 MG SL tablet Commonly known as:  NITROSTAT Place 1 tablet (0.4 mg total) under the tongue as needed.   oxyCODONE 5 MG immediate release tablet Commonly known as:  Oxy IR/ROXICODONE Take 1-2 tablets (5-10 mg total) by mouth every 6 (six) hours as needed for severe pain.   pravastatin 10 MG tablet Commonly known as:  PRAVACHOL TAKE 1 TABLET (10 MG TOTAL) BY MOUTH DAILY.   vitamin B-12 1000 MCG tablet Commonly known as:  CYANOCOBALAMIN Take 1,000 mcg by mouth daily.      Follow-up Information    Gaye Pollack, MD Follow up.   Specialty:  Cardiothoracic Surgery Why:  01/13/2017 to see surgeon. Please obtain a chest xray at [facility] at 12:30 pm, which is located in the same office complex. Contact information: [address] [place] [address] [phone]           Signed: John Giovanni 01/04/2017, 9:25 AM

## 2017-01-04 NOTE — Progress Notes (Signed)
[address]       [place] [address]             [phone]      4 Days Post-Op Procedure(s) (LRB): VIDEO ASSISTED THORACOSCOPY (Left) DRAINAGE OF PLEURAL EFFUSION/empyema (Left) Subjective: Feels pretty well  Objective: Vital signs in last 24 hours: Temp:  [98.2 F (36.8 C)-99 F (37.2 C)] 99 F (37.2 C) (02/05 0450) Pulse Rate:  [73-79] 73 (02/05 0450) Cardiac Rhythm: Normal sinus rhythm (02/04 1950) Resp:  [18] 18 (02/05 0450) BP: (123-134)/(49-64) 128/49 (02/05 0450) SpO2:  [91 %] 91 % (02/05 0450) Weight:  [171 lb 4.8 oz (77.7 kg)] 171 lb 4.8 oz (77.7 kg) (02/05 0450)  Hemodynamic parameters for last 24 hours:    Intake/Output from previous day: 02/04 0701 - 02/05 0700 In: 480 [P.O.:480] Out: 555 [Urine:555] Intake/Output this shift: No intake/output data recorded.  General appearance: alert, cooperative and no distress Heart: regular rate and rhythm Lungs: mildly dim in left base Abdomen: benign Extremities: no edema Wound: incis healing well  Lab Results:  Recent Labs  01/03/17 0145 01/04/17 0318  WBC 14.3* 14.8*  HGB 8.5* 9.0*  HCT 26.2* 27.5*  PLT 544* 618*   BMET:  Recent Labs  01/03/17 0145 01/04/17 0318  NA 136 138  K 3.9 4.5  CL 102 102  CO2 27 29  GLUCOSE 106* 107*  BUN 14 13  CREATININE 0.86 1.02  CALCIUM 7.7* 7.8*    PT/INR: No results for input(s): LABPROT, INR in the last 72 hours. ABG    Component Value Date/Time   PHART 7.413 01/01/2017 0426   HCO3 26.7 01/01/2017 0426   TCO2 28 01/01/2017 0426   ACIDBASEDEF 1.0 05/07/2008 1051   O2SAT 96.0 01/01/2017 0426   CBG (last 3)   Recent Labs  01/01/17 1211 01/01/17 1624 01/01/17 1954  GLUCAP 153* 159* 157*    Meds Scheduled Meds: . acetaminophen  1,000 mg Oral Q6H   Or  . acetaminophen (TYLENOL) oral liquid 160 mg/5 mL  1,000 mg Oral Q6H  . bisacodyl  10 mg Oral Daily  . levothyroxine  112 mcg Oral QAC breakfast  . magnesium citrate   1 Bottle Oral Once  . mouth rinse  15 mL Mouth Rinse BID  . piperacillin-tazobactam (ZOSYN)  IV  3.375 g Intravenous Q8H  . polyethylene glycol  17 g Oral Daily  . pravastatin  10 mg Oral q1800  . senna-docusate  1 tablet Oral BID  . vitamin B-12  1,000 mcg Oral Daily   Continuous Infusions: . dextrose 5 % and 0.9% NaCl 10 mL/hr at 01/02/17 1500   PRN Meds:.ipratropium-albuterol, ondansetron (ZOFRAN) IV, oxyCODONE, potassium chloride (KCL MULTIRUN) 30 mEq in 265 mL IVPB, traMADol  Xrays Dg Chest Port 1 View  Result Date: 01/03/2017 CLINICAL DATA:  Chest tube on the left. EXAM: PORTABLE CHEST 1 VIEW COMPARISON:  January 02, 2017 FINDINGS: One of the 2 left chest tubes have been removed. The other is stable. The right central line is been removed. No pneumothorax. The right lung is clear. The cardiomediastinal silhouette is stable. Opacity throughout the left lung is similar in the interval with left-sided pleural fluid identified. IMPRESSION: 1. One of the 2 left-sided chest tubes has been removed and the other is stable. Continued left pleural effusion and opacification throughout the left lung, similar in the interval. No other significant interval changes. Removal of right central line. Electronically Signed   By: Shanon Brow  Jimmye Norman III M.D   On: 01/03/2017 07:42   Results for orders placed or performed during the hospital encounter of 12/28/16  Culture, blood (Routine X 2) w Reflex to ID Panel     Status: None   Collection Time: 12/29/16 12:00 AM  Result Value Ref Range Status   Specimen Description BLOOD RIGHT ARM  Final   Special Requests BOTTLES DRAWN AEROBIC AND ANAEROBIC 5ML  Final   Culture NO GROWTH 5 DAYS  Final   Report Status 01/03/2017 FINAL  Final  Culture, blood (Routine X 2) w Reflex to ID Panel     Status: None   Collection Time: 12/29/16 12:18 AM  Result Value Ref Range Status   Specimen Description BLOOD RIGHT HAND  Final   Special Requests BOTTLES DRAWN AEROBIC AND  ANAEROBIC 5ML  Final   Culture NO GROWTH 5 DAYS  Final   Report Status 01/03/2017 FINAL  Final  Body fluid culture     Status: None   Collection Time: 12/30/16 12:02 PM  Result Value Ref Range Status   Specimen Description PLEURAL  Final   Special Requests NONE  Final   Gram Stain   Final    ABUNDANT WBC PRESENT, PREDOMINANTLY PMN NO ORGANISMS SEEN    Culture NO GROWTH 3 DAYS  Final   Report Status 01/02/2017 FINAL  Final  Surgical pcr screen     Status: None   Collection Time: 12/30/16 11:52 PM  Result Value Ref Range Status   MRSA, PCR NEGATIVE NEGATIVE Final   Staphylococcus aureus NEGATIVE NEGATIVE Final    Comment:        The Xpert SA Assay (FDA approved for NASAL specimens in patients over 23 years of age), is one component of a comprehensive surveillance program.  Test performance has been validated by [facility] for patients greater than or equal to 54 year old. It is not intended to diagnose infection nor to guide or monitor treatment.   Fungus Culture With Stain     Status: None (Preliminary result)   Collection Time: 12/31/16 12:59 PM  Result Value Ref Range Status   Fungus Stain Final report  Final    Comment: (NOTE) Performed At: [facility] [place], [place] [id number] Lindon Romp MD [id number]    Fungus (Mycology) Culture PENDING  Incomplete   Fungal Source PLEURAL  Final    Comment: FLUID LEFT   Acid Fast Smear (AFB)     Status: None   Collection Time: 12/31/16 12:59 PM  Result Value Ref Range Status   AFB Specimen Processing Concentration  Final   Acid Fast Smear Negative  Final    Comment: (NOTE) Performed At: [facility] [place], [place] [id number] Lindon Romp MD [id number]    Source (AFB) PLEURAL  Final    Comment: FLUID LEFT   Fungus Culture Result     Status: None   Collection Time: 12/31/16 12:59 PM  Result Value Ref Range Status   Result 1 Comment  Final    Comment:  (NOTE) KOH/Calcofluor preparation:  no fungus observed. Performed At: [facility] [place], [place] [id number] Lindon Romp MD [id number]   Anaerobic culture     Status: None (Preliminary result)   Collection Time: 12/31/16  1:16 PM  Result Value Ref Range Status   Specimen Description TISSUE PLEURAL  Final   Special Requests PLEURAL PEEL  Final   Culture   Final  NO ANAEROBES ISOLATED; CULTURE IN PROGRESS FOR 5 DAYS   Report Status PENDING  Incomplete  Aerobic Culture (superficial specimen)     Status: None   Collection Time: 12/31/16  1:16 PM  Result Value Ref Range Status   Specimen Description TISSUE PLEURAL  Final   Special Requests PLEURAL PEEL  Final   Gram Stain   Final    ABUNDANT WBC PRESENT, PREDOMINANTLY PMN RARE GRAM POSITIVE COCCI IN PAIRS    Culture FEW STREPTOCOCCUS INTERMEDIUS  Final   Report Status 01/03/2017 FINAL  Final   Organism ID, Bacteria STREPTOCOCCUS INTERMEDIUS  Final      Susceptibility   Streptococcus intermedius - MIC*    PENICILLIN Value in next row Sensitive      SENSITIVE<=0.06    CEFTRIAXONE Value in next row Sensitive      SENSITIVE0.25    ERYTHROMYCIN Value in next row Sensitive      SENSITIVE0.25    LEVOFLOXACIN Value in next row Sensitive      SENSITIVE0.25    VANCOMYCIN Value in next row Sensitive      SENSITIVE0.25    * FEW STREPTOCOCCUS INTERMEDIUS   *Note: Due to a large number of results and/or encounters for the requested time period, some results have not been displayed. A complete set of results can be found in Results Review.   Assessment/Plan: S/P Procedure(s) (LRB): VIDEO ASSISTED THORACOSCOPY (Left) DRAINAGE OF PLEURAL EFFUSION/empyema (Left)  1 steady clinical improvement, Tmax 99, leukocytosis is stable 2 ? Timing for d/c zosyn to po augmentin 3 ? Timing of restart plavix 4 poss d/c soon  LOS: 6 days    Lima Chillemi E 01/04/2017

## 2017-01-04 NOTE — Consult Note (Signed)
            [facility] CM Primary Care Navigator  01/04/2017  SPIKE EKIS 1930/06/09 [id number]   Went to see patient at the bedside to identify possible discharge needsbut he was already discharged home today per staff.  Primary care provider's office called Caryl Pina) to notify of patient's discharge and need for post hospital follow-up and transition of care.  Also made aware to refer patient to [facility] care management for care coordination needs if deemed appropriate for services.  For additional questions please contact:  Edwena Felty A. Ovie Eastep, BSN, RN-BC [facility] PRIMARY CARE Navigator Cell: [phone]

## 2017-01-04 NOTE — Care Management Note (Signed)
Case Management Note Marvetta Gibbons RN, BSN Unit 2W-Case Manager [phone]  Patient Details  Name: Joseph Roman MRN: [id number] Date of Birth: [birth year]  Subjective/Objective:   Pt admitted with sepsis, pna/empyema, s/p VATS on 2/1-- tx from ICU to 2W on 01/02/17                 Action/Plan: PTA pt lived at home, plan to return home - no CM needs noted for discharge.    Expected Discharge Date:  01/04/17               Expected Discharge Plan:  Home/Self Care  In-House Referral:     Discharge planning Services  CM Consult  Post Acute Care Choice:    Choice offered to:     DME Arranged:    DME Agency:     HH Arranged:    HH Agency:     Status of Service:  Completed, signed off  If discussed at [employer] of Stay Meetings, dates discussed:    Additional Comments:  Dawayne Patricia, RN 01/04/2017, 10:20 AM

## 2017-01-04 NOTE — Plan of Care (Signed)
I had seen the patient this am, discussed with Jadene Pierini PA-C CT surgery. Planned DC today by CT surgery. He is stable from my standpoint point, question when to restart plavix.  I will sign off.    Amenah Tucci M.D. [facility] 01/04/2017, 9:21 AM  Pager: [phone]

## 2017-01-05 LAB — ANAEROBIC CULTURE

## 2017-01-05 NOTE — Op Note (Signed)
12/31/2016 Lucie Leather [id number]  Surgeon: Gaye Pollack, MD   First Assistant: Lars Pinks, PA-C  Preoperative Diagnosis: Left empyema   Postoperative Diagnosis: Left empyema   Procedure:  1. Left video-assisted thoracoscopy (VATS) 2. Drainage of empyema  3. Decortication of the left lung   Anesthesia: General Endotracheal   Clinical History/Surgical Indication:   The patient is an active 83 year old gentleman with a history of hyperlipidemia, hypothyroidism, CAD s/p MI and PCI with stenting of the LCX in 04/2008. He has done well without any chest pain or shortness of breath until his recent illness. He went to his PCP a couple weeks ago with flu-like symptoms and was treated with Tamiflu. He completed the course but says that he continued to have fever to 102 associated with cough and shortness of breath. A CXR showed pneumonia and he was started on Levaquin last Friday. He says that on Monday evening he started having some sharp, constant left chest pain associated with coughing which made it worse. He though it might be his heart and took NTG and 4 baby aspirin with some improvement. He called EMS and was given an additional NTG. He was found to have an oxygen sat of 90%, leukocytosis to 22.3, negative troponin and a CXR showing LLL consolidation and pleural effusion. He had a CT scan on admission which showed a large loculated left pleural effusion with LLL collapse consistent with pneumonia and empyema. He had a thoracentesis removing 400 cc of cloudy fluid that is an exudate.   This 83 year old active gentleman has a large left empyema with collapse of the LLL of the lung. It is likely that he had flu, then pneumonia followed by parapneumonic effusion. I think this is best treated with left thoracoscopy to allow complete drainage and reexpansion of the left lung. It is possible that this will be loculated and require a thoracotomy but Dr. Titus Mould did an ultrasound and  did not see any loculations. I discussed the procedure of left VATS, possible thoracotomy with the patient including alternatives, benefits and risks including but not limited to bleeding, blood transfusion, infection, air leak, recurrent effusion and he understands and agrees to proceed.  Preparation:  The patient was seen in the preoperative holding area and the correct patient, correct operation, correct operative sidewere confirmed with the patient after reviewing the medical record and CT scan. The consent was signed by me. Preoperative antibiotics were given. The left side of the chest was signed by me. The patient was taken back to the operating room and positioned supine on the operating room table. After being placed under general endotracheal anesthesia by the anesthesia team using a double lumen tube a foley catheter was placed. The patient was turned into the right lateral decubitus position. The chest was prepped with betadine soap and solution. A surgical time-out was taken and the correct patient,operative side, and operative procedure were confirmed with the nursing and anesthesia staff.   Operative Procedure:  A 1 cm incision was made in the mid-axillary line at about the 8th intercostal space and an 8 mm trocar was inserted into the pleural space. The 30 degree thoracoscope was inserted and there was a multi-loculated empyema. A second 1 cm incision was made in the posterior axillary line at the 6th ICS and a third in the anterior axillary line at the 4th ICS for instruments. The loculations were divided and the fibrinous exudate and fluid were removed. Samples were sent for culture.  The fibrinous peel over the lung was removed as completely as possible. The chest was irrigated with saline and hemostasis was complete. A 28 F chest tube was placed posteriorly through the mid-axillary incision and a 32 F Blake drain was placed through the anterior incision. The posterior incision was closed in  layers with Nylon interrupted skin sutures.  All sponge, needle, and instrument counts were reported correct at the end of the case. Dry sterile dressings were placed over the incisions and around the chest tubes which were connected to pleurevac suction. The patient was turned supine, extubated,then transported to the PACU in satisfactory and stable condition.

## 2017-01-06 ENCOUNTER — Encounter (HOSPITAL_COMMUNITY): Payer: Self-pay

## 2017-01-06 DIAGNOSIS — I2510 Atherosclerotic heart disease of native coronary artery without angina pectoris: Secondary | ICD-10-CM | POA: Insufficient documentation

## 2017-01-08 ENCOUNTER — Ambulatory Visit (INDEPENDENT_AMBULATORY_CARE_PROVIDER_SITE_OTHER): Payer: Self-pay | Admitting: *Deleted

## 2017-01-08 ENCOUNTER — Encounter (HOSPITAL_COMMUNITY): Payer: Self-pay

## 2017-01-08 DIAGNOSIS — Z09 Encounter for follow-up examination after completed treatment for conditions other than malignant neoplasm: Secondary | ICD-10-CM

## 2017-01-08 DIAGNOSIS — Z4802 Encounter for removal of sutures: Secondary | ICD-10-CM

## 2017-01-08 DIAGNOSIS — J869 Pyothorax without fistula: Secondary | ICD-10-CM

## 2017-01-08 NOTE — Progress Notes (Signed)
Joseph Roman returns after L VATS and DRAINAGE OF L EMPYEMA 12/31/16. He is doing well. Diet and bowels are good. He only c/o fatigue. Sutures were easily removed from three previous chest tube/port sites. Theses sites as well as the mini thoracotomy incision are all very well healed. He is not requiring any narcotics. H e will return as scheduled with a CXR.

## 2017-01-11 ENCOUNTER — Encounter (HOSPITAL_COMMUNITY): Payer: Self-pay

## 2017-01-11 DIAGNOSIS — E038 Other specified hypothyroidism: Secondary | ICD-10-CM | POA: Diagnosis not present

## 2017-01-12 ENCOUNTER — Other Ambulatory Visit: Payer: Self-pay | Admitting: Surgery

## 2017-01-12 DIAGNOSIS — J869 Pyothorax without fistula: Secondary | ICD-10-CM

## 2017-01-13 ENCOUNTER — Encounter (HOSPITAL_COMMUNITY): Payer: Self-pay

## 2017-01-13 ENCOUNTER — Ambulatory Visit (INDEPENDENT_AMBULATORY_CARE_PROVIDER_SITE_OTHER): Payer: Self-pay | Admitting: Surgery

## 2017-01-13 ENCOUNTER — Ambulatory Visit
Admission: RE | Admit: 2017-01-13 | Discharge: 2017-01-13 | Disposition: A | Discharge status: Home / Self Care | Payer: Medicare Other | Source: Ambulatory Visit | Attending: Surgery | Admitting: Surgery

## 2017-01-13 ENCOUNTER — Encounter: Payer: Self-pay | Admitting: Surgery

## 2017-01-13 VITALS — BP 117/72 | HR 78 | Resp 20 | Ht 70.0 in | Wt 171.0 lb

## 2017-01-13 DIAGNOSIS — Z09 Encounter for follow-up examination after completed treatment for conditions other than malignant neoplasm: Secondary | ICD-10-CM

## 2017-01-13 DIAGNOSIS — J439 Emphysema, unspecified: Secondary | ICD-10-CM | POA: Diagnosis not present

## 2017-01-13 DIAGNOSIS — J869 Pyothorax without fistula: Secondary | ICD-10-CM

## 2017-01-14 DIAGNOSIS — D538 Other specified nutritional anemias: Secondary | ICD-10-CM | POA: Diagnosis not present

## 2017-01-15 ENCOUNTER — Encounter (HOSPITAL_COMMUNITY)
Admission: RE | Admit: 2017-01-15 | Discharge: 2017-01-15 | Disposition: A | Discharge status: Home / Self Care | Payer: Self-pay | Source: Ambulatory Visit | Attending: Cardiology | Admitting: Cardiology

## 2017-01-17 ENCOUNTER — Encounter: Payer: Self-pay | Admitting: Surgery

## 2017-01-17 NOTE — Progress Notes (Signed)
     HPI: Patient returns for routine postoperative follow-up having undergone left VATS for drainage of an empyema and decortication of the lung on 12/31/2016. The patient's early postoperative recovery while in the hospital was notable for an uncomplicated postop course. Since hospital discharge the patient reports that he has been feeling well. He notes mild shortness of breath with ambulation but it is improving. He has had no cough, sputum production, fever or chills.   Current Outpatient Prescriptions  Medication Sig Dispense Refill  . aspirin 81 MG tablet Take 81 mg by mouth daily.    . clopidogrel (PLAVIX) 75 MG tablet Take 1 tablet (75 mg total) by mouth daily. 30 tablet 11  . levothyroxine (SYNTHROID, LEVOTHROID) 112 MCG tablet Take 112 mcg by mouth daily.    . nitroGLYCERIN (NITROSTAT) 0.4 MG SL tablet Place 1 tablet (0.4 mg total) under the tongue as needed. 25 tablet 3  . pravastatin (PRAVACHOL) 10 MG tablet TAKE 1 TABLET (10 MG TOTAL) BY MOUTH DAILY. 30 tablet 11  . vitamin B-12 (CYANOCOBALAMIN) 1000 MCG tablet Take 1,000 mcg by mouth daily.     No current facility-administered medications for this visit.     Physical Exam: BP 117/72   Pulse 78   Resp 20   Ht 5\' 10"  (1.778 m)   Wt 171 lb (77.6 kg)   SpO2 96% Comment: RA  BMI 24.54 kg/m  He looks well Lung exam shows slight decrease in breath sounds on the left posteriorly The chest incisions are healing well  Diagnostic Tests:  CLINICAL DATA:  Status post the ACS for empyema.  EXAM: CHEST  2 VIEW  COMPARISON:  Chest CT 12/29/2016 and chest x-ray 01/04/2017.  FINDINGS: The cardiac silhouette, mediastinal and hilar contours are stable. There is a persistent loculated pleural fluid collection on the left side along with left lower lobe atelectasis. The right lung remains clear except for a small upper lobe pulmonary nodule which is unchanged.  IMPRESSION: Persistent loculated pleural fluid collection on  the left side and persistent left lower lobe atelectasis. No pneumothorax.   Electronically Signed   By: Marijo Sanes M.D.   On: 01/13/2017 12:15  Impression:  He is doing well following surgery for an empyema. He still has a small loculated posterior fluid collection in the left pleural space but this should resolve with time. I encouraged him to continue ambulating and to use his IS.  Plan:  I will see him back in one month with a CXR.   Gaye Pollack, MD [facility] [phone]

## 2017-01-18 ENCOUNTER — Encounter (HOSPITAL_COMMUNITY)
Admission: RE | Admit: 2017-01-18 | Discharge: 2017-01-18 | Disposition: A | Discharge status: Home / Self Care | Payer: Self-pay | Source: Ambulatory Visit | Attending: Cardiology | Admitting: Cardiology

## 2017-01-20 ENCOUNTER — Encounter (HOSPITAL_COMMUNITY)
Admission: RE | Admit: 2017-01-20 | Discharge: 2017-01-20 | Disposition: A | Discharge status: Home / Self Care | Payer: Self-pay | Source: Ambulatory Visit | Attending: Cardiology | Admitting: Cardiology

## 2017-01-22 ENCOUNTER — Encounter (HOSPITAL_COMMUNITY)
Admission: RE | Admit: 2017-01-22 | Discharge: 2017-01-22 | Disposition: A | Discharge status: Home / Self Care | Payer: Self-pay | Source: Ambulatory Visit | Attending: Cardiology | Admitting: Cardiology

## 2017-01-25 ENCOUNTER — Encounter (HOSPITAL_COMMUNITY)
Admission: RE | Admit: 2017-01-25 | Discharge: 2017-01-25 | Disposition: A | Discharge status: Home / Self Care | Payer: Self-pay | Source: Ambulatory Visit | Attending: Cardiology | Admitting: Cardiology

## 2017-01-27 ENCOUNTER — Encounter (HOSPITAL_COMMUNITY)
Admission: RE | Admit: 2017-01-27 | Discharge: 2017-01-27 | Disposition: A | Discharge status: Home / Self Care | Payer: Self-pay | Source: Ambulatory Visit | Attending: Cardiology | Admitting: Cardiology

## 2017-01-29 ENCOUNTER — Encounter (HOSPITAL_COMMUNITY): Payer: Medicare Other

## 2017-01-29 DIAGNOSIS — I2510 Atherosclerotic heart disease of native coronary artery without angina pectoris: Secondary | ICD-10-CM | POA: Insufficient documentation

## 2017-01-29 LAB — FUNGAL ORGANISM REFLEX

## 2017-01-29 LAB — FUNGUS CULTURE WITH STAIN

## 2017-01-29 LAB — FUNGUS CULTURE RESULT

## 2017-02-01 ENCOUNTER — Encounter (HOSPITAL_COMMUNITY)
Admission: RE | Admit: 2017-02-01 | Discharge: 2017-02-01 | Disposition: A | Discharge status: Home / Self Care | Payer: Medicare Other | Source: Ambulatory Visit | Attending: Cardiology | Admitting: Cardiology

## 2017-02-03 ENCOUNTER — Encounter (HOSPITAL_COMMUNITY)
Admission: RE | Admit: 2017-02-03 | Discharge: 2017-02-03 | Disposition: A | Discharge status: Home / Self Care | Payer: Medicare Other | Source: Ambulatory Visit | Attending: Cardiology | Admitting: Cardiology

## 2017-02-05 ENCOUNTER — Encounter (HOSPITAL_COMMUNITY)
Admission: RE | Admit: 2017-02-05 | Discharge: 2017-02-05 | Disposition: A | Discharge status: Home / Self Care | Payer: Medicare Other | Source: Ambulatory Visit | Attending: Cardiology | Admitting: Cardiology

## 2017-02-07 DIAGNOSIS — R5383 Other fatigue: Secondary | ICD-10-CM | POA: Diagnosis not present

## 2017-02-07 DIAGNOSIS — J189 Pneumonia, unspecified organism: Secondary | ICD-10-CM | POA: Diagnosis not present

## 2017-02-07 DIAGNOSIS — E038 Other specified hypothyroidism: Secondary | ICD-10-CM | POA: Diagnosis not present

## 2017-02-07 DIAGNOSIS — Z6824 Body mass index (BMI) 24.0-24.9, adult: Secondary | ICD-10-CM | POA: Diagnosis not present

## 2017-02-07 DIAGNOSIS — R05 Cough: Secondary | ICD-10-CM | POA: Diagnosis not present

## 2017-02-07 DIAGNOSIS — R030 Elevated blood-pressure reading, without diagnosis of hypertension: Secondary | ICD-10-CM | POA: Diagnosis not present

## 2017-02-08 ENCOUNTER — Other Ambulatory Visit: Payer: Self-pay | Admitting: Surgery

## 2017-02-08 ENCOUNTER — Encounter (HOSPITAL_COMMUNITY): Payer: Medicare Other

## 2017-02-08 DIAGNOSIS — J869 Pyothorax without fistula: Secondary | ICD-10-CM

## 2017-02-10 ENCOUNTER — Encounter: Payer: Self-pay | Admitting: Surgery

## 2017-02-10 ENCOUNTER — Encounter (HOSPITAL_COMMUNITY)
Admission: RE | Admit: 2017-02-10 | Discharge: 2017-02-10 | Disposition: A | Discharge status: Home / Self Care | Payer: Medicare Other | Source: Ambulatory Visit | Attending: Cardiology | Admitting: Cardiology

## 2017-02-11 DIAGNOSIS — D538 Other specified nutritional anemias: Secondary | ICD-10-CM | POA: Diagnosis not present

## 2017-02-12 ENCOUNTER — Ambulatory Visit
Admission: RE | Admit: 2017-02-12 | Discharge: 2017-02-12 | Disposition: A | Discharge status: Home / Self Care | Payer: Medicare Other | Source: Ambulatory Visit | Attending: Surgery | Admitting: Surgery

## 2017-02-12 ENCOUNTER — Encounter (HOSPITAL_COMMUNITY): Payer: Medicare Other

## 2017-02-12 ENCOUNTER — Ambulatory Visit (INDEPENDENT_AMBULATORY_CARE_PROVIDER_SITE_OTHER): Payer: Self-pay | Admitting: Surgery

## 2017-02-12 ENCOUNTER — Encounter: Payer: Self-pay | Admitting: Surgery

## 2017-02-12 VITALS — BP 128/76 | HR 66 | Resp 20 | Ht 70.0 in | Wt 170.0 lb

## 2017-02-12 DIAGNOSIS — Z09 Encounter for follow-up examination after completed treatment for conditions other than malignant neoplasm: Secondary | ICD-10-CM

## 2017-02-12 DIAGNOSIS — J869 Pyothorax without fistula: Secondary | ICD-10-CM

## 2017-02-12 DIAGNOSIS — J439 Emphysema, unspecified: Secondary | ICD-10-CM | POA: Diagnosis not present

## 2017-02-15 ENCOUNTER — Encounter: Payer: Self-pay | Admitting: Surgery

## 2017-02-15 ENCOUNTER — Encounter (HOSPITAL_COMMUNITY)
Admission: RE | Admit: 2017-02-15 | Discharge: 2017-02-15 | Disposition: A | Discharge status: Home / Self Care | Payer: Medicare Other | Source: Ambulatory Visit | Attending: Cardiology | Admitting: Cardiology

## 2017-02-15 LAB — ACID FAST CULTURE WITH REFLEXED SENSITIVITIES (MYCOBACTERIA): Acid Fast Culture: NEGATIVE

## 2017-02-15 NOTE — Progress Notes (Signed)
     HPI:  Patient returns for  follow-up having undergone left VATS for drainage of an empyema and decortication of the lung on 12/31/2016. Since I last saw him on 2/14 he has been feeling well overall. He still notes some shortness of breath with going up stairs but it is better. He has had no cough, sputum production, fever or chills.  Current Outpatient Prescriptions  Medication Sig Dispense Refill  . aspirin 81 MG tablet Take 81 mg by mouth daily.    . clopidogrel (PLAVIX) 75 MG tablet Take 1 tablet (75 mg total) by mouth daily. 30 tablet 11  . levothyroxine (SYNTHROID, LEVOTHROID) 112 MCG tablet Take 112 mcg by mouth daily.    . nitroGLYCERIN (NITROSTAT) 0.4 MG SL tablet Place 1 tablet (0.4 mg total) under the tongue as needed. 25 tablet 3  . pravastatin (PRAVACHOL) 10 MG tablet TAKE 1 TABLET (10 MG TOTAL) BY MOUTH DAILY. 30 tablet 11  . vitamin B-12 (CYANOCOBALAMIN) 1000 MCG tablet Take 1,000 mcg by mouth daily.     No current facility-administered medications for this visit.      Physical Exam: BP 128/76   Pulse 66   Resp 20   Ht 5\' 10"  (1.778 m)   Wt 170 lb (77.1 kg)   SpO2 96% Comment: RA  BMI 24.39 kg/m  He looks well Lung exam shows slight decrease in breath sounds on the left posteriorly The chest incisions are healing well  Diagnostic Tests:  CLINICAL DATA:  Empyema.  EXAM: CHEST  2 VIEW  COMPARISON:  01/13/2017 .  FINDINGS: Left-sided pleural-based process is again noted. This appears smaller than on prior exam. Left base pleuroparenchymal thickening noted consistent with small effusion and/or scarring . Stable tiny nodular opacity right apex. Heart size normal.  IMPRESSION: 1. Persistent but improving left-sided pleural based process. Continued follow-up exams to demonstrate clearing noted.  2.  Persistent small nodule right apex.   Electronically Signed   By: Marcello Moores  Register   On: 02/12/2017 09:07   Impression:  He is doing well  clinically. There is still a small loculated posterior left pleural fluid collection but it is getting smaller. There was also a small nodule in the right apex that was visible on his CTA on 12/29/2016. This will need follow up.  Plan:  I will see him back in 3 months with a CT scan of the chest to follow up on the RUL lung nodule and the left pleural fluid collection.   Gaye Pollack, MD [facility] [phone]

## 2017-02-17 ENCOUNTER — Encounter (HOSPITAL_COMMUNITY)
Admission: RE | Admit: 2017-02-17 | Discharge: 2017-02-17 | Disposition: A | Discharge status: Home / Self Care | Payer: Medicare Other | Source: Ambulatory Visit | Attending: Cardiology | Admitting: Cardiology

## 2017-02-19 ENCOUNTER — Encounter (HOSPITAL_COMMUNITY)
Admission: RE | Admit: 2017-02-19 | Discharge: 2017-02-19 | Disposition: A | Discharge status: Home / Self Care | Payer: Medicare Other | Source: Ambulatory Visit | Attending: Cardiology | Admitting: Cardiology

## 2017-02-22 ENCOUNTER — Encounter (HOSPITAL_COMMUNITY)
Admission: RE | Admit: 2017-02-22 | Discharge: 2017-02-22 | Disposition: A | Discharge status: Home / Self Care | Payer: Medicare Other | Source: Ambulatory Visit | Attending: Cardiology | Admitting: Cardiology

## 2017-02-23 DIAGNOSIS — D225 Melanocytic nevi of trunk: Secondary | ICD-10-CM | POA: Diagnosis not present

## 2017-02-23 DIAGNOSIS — D485 Neoplasm of uncertain behavior of skin: Secondary | ICD-10-CM | POA: Diagnosis not present

## 2017-02-23 DIAGNOSIS — L821 Other seborrheic keratosis: Secondary | ICD-10-CM | POA: Diagnosis not present

## 2017-02-23 DIAGNOSIS — L570 Actinic keratosis: Secondary | ICD-10-CM | POA: Diagnosis not present

## 2017-02-23 DIAGNOSIS — L812 Freckles: Secondary | ICD-10-CM | POA: Diagnosis not present

## 2017-02-23 DIAGNOSIS — C4362 Malignant melanoma of left upper limb, including shoulder: Secondary | ICD-10-CM | POA: Diagnosis not present

## 2017-02-24 ENCOUNTER — Encounter (HOSPITAL_COMMUNITY)
Admission: RE | Admit: 2017-02-24 | Discharge: 2017-02-24 | Disposition: A | Discharge status: Home / Self Care | Payer: Medicare Other | Source: Ambulatory Visit | Attending: Cardiology | Admitting: Cardiology

## 2017-02-26 ENCOUNTER — Encounter (HOSPITAL_COMMUNITY): Payer: Medicare Other

## 2017-03-01 ENCOUNTER — Encounter (HOSPITAL_COMMUNITY)
Admission: RE | Admit: 2017-03-01 | Discharge: 2017-03-01 | Disposition: A | Discharge status: Home / Self Care | Payer: PRIVATE HEALTH INSURANCE | Source: Ambulatory Visit | Attending: Cardiology | Admitting: Cardiology

## 2017-03-01 DIAGNOSIS — I2510 Atherosclerotic heart disease of native coronary artery without angina pectoris: Secondary | ICD-10-CM | POA: Insufficient documentation

## 2017-03-03 ENCOUNTER — Encounter (HOSPITAL_COMMUNITY)
Admission: RE | Admit: 2017-03-03 | Discharge: 2017-03-03 | Disposition: A | Discharge status: Home / Self Care | Payer: PRIVATE HEALTH INSURANCE | Source: Ambulatory Visit | Attending: Cardiology | Admitting: Cardiology

## 2017-03-05 ENCOUNTER — Encounter (HOSPITAL_COMMUNITY)
Admission: RE | Admit: 2017-03-05 | Discharge: 2017-03-05 | Disposition: A | Discharge status: Home / Self Care | Payer: PRIVATE HEALTH INSURANCE | Source: Ambulatory Visit | Attending: Cardiology | Admitting: Cardiology

## 2017-03-08 ENCOUNTER — Encounter (HOSPITAL_COMMUNITY)
Admission: RE | Admit: 2017-03-08 | Discharge: 2017-03-08 | Disposition: A | Discharge status: Home / Self Care | Payer: PRIVATE HEALTH INSURANCE | Source: Ambulatory Visit | Attending: Cardiology | Admitting: Cardiology

## 2017-03-10 ENCOUNTER — Encounter (HOSPITAL_COMMUNITY)
Admission: RE | Admit: 2017-03-10 | Discharge: 2017-03-10 | Disposition: A | Discharge status: Home / Self Care | Payer: PRIVATE HEALTH INSURANCE | Source: Ambulatory Visit | Attending: Cardiology | Admitting: Cardiology

## 2017-03-12 ENCOUNTER — Encounter (HOSPITAL_COMMUNITY)
Admission: RE | Admit: 2017-03-12 | Discharge: 2017-03-12 | Disposition: A | Discharge status: Home / Self Care | Payer: PRIVATE HEALTH INSURANCE | Source: Ambulatory Visit | Attending: Cardiology | Admitting: Cardiology

## 2017-03-15 ENCOUNTER — Encounter (HOSPITAL_COMMUNITY)
Admission: RE | Admit: 2017-03-15 | Discharge: 2017-03-15 | Disposition: A | Discharge status: Home / Self Care | Payer: PRIVATE HEALTH INSURANCE | Source: Ambulatory Visit | Attending: Cardiology | Admitting: Cardiology

## 2017-03-17 ENCOUNTER — Encounter (HOSPITAL_COMMUNITY)
Admission: RE | Admit: 2017-03-17 | Discharge: 2017-03-17 | Disposition: A | Discharge status: Home / Self Care | Payer: PRIVATE HEALTH INSURANCE | Source: Ambulatory Visit | Attending: Cardiology | Admitting: Cardiology

## 2017-03-19 ENCOUNTER — Encounter (HOSPITAL_COMMUNITY): Payer: PRIVATE HEALTH INSURANCE

## 2017-03-21 ENCOUNTER — Other Ambulatory Visit: Payer: Self-pay | Admitting: Cardiology

## 2017-03-22 ENCOUNTER — Encounter (HOSPITAL_COMMUNITY): Payer: PRIVATE HEALTH INSURANCE

## 2017-03-22 DIAGNOSIS — Z85820 Personal history of malignant melanoma of skin: Secondary | ICD-10-CM | POA: Diagnosis not present

## 2017-03-22 DIAGNOSIS — C4362 Malignant melanoma of left upper limb, including shoulder: Secondary | ICD-10-CM | POA: Diagnosis not present

## 2017-03-22 DIAGNOSIS — L905 Scar conditions and fibrosis of skin: Secondary | ICD-10-CM | POA: Diagnosis not present

## 2017-03-24 ENCOUNTER — Encounter (HOSPITAL_COMMUNITY): Payer: PRIVATE HEALTH INSURANCE

## 2017-03-26 ENCOUNTER — Encounter (HOSPITAL_COMMUNITY): Payer: PRIVATE HEALTH INSURANCE

## 2017-03-29 ENCOUNTER — Encounter (HOSPITAL_COMMUNITY): Payer: PRIVATE HEALTH INSURANCE

## 2017-03-31 ENCOUNTER — Encounter (HOSPITAL_COMMUNITY): Payer: Medicare Other

## 2017-03-31 DIAGNOSIS — I2510 Atherosclerotic heart disease of native coronary artery without angina pectoris: Secondary | ICD-10-CM | POA: Insufficient documentation

## 2017-04-02 ENCOUNTER — Encounter (HOSPITAL_COMMUNITY)
Admission: RE | Admit: 2017-04-02 | Discharge: 2017-04-02 | Disposition: A | Discharge status: Home / Self Care | Payer: Self-pay | Source: Ambulatory Visit | Attending: Cardiology | Admitting: Cardiology

## 2017-04-05 ENCOUNTER — Encounter (HOSPITAL_COMMUNITY)
Admission: RE | Admit: 2017-04-05 | Discharge: 2017-04-05 | Disposition: A | Discharge status: Home / Self Care | Payer: Self-pay | Source: Ambulatory Visit | Attending: Cardiology | Admitting: Cardiology

## 2017-04-07 ENCOUNTER — Encounter (HOSPITAL_COMMUNITY)
Admission: RE | Admit: 2017-04-07 | Discharge: 2017-04-07 | Disposition: A | Discharge status: Home / Self Care | Payer: Self-pay | Source: Ambulatory Visit | Attending: Cardiology | Admitting: Cardiology

## 2017-04-09 ENCOUNTER — Encounter (HOSPITAL_COMMUNITY): Payer: Self-pay

## 2017-04-12 ENCOUNTER — Encounter (HOSPITAL_COMMUNITY): Payer: Self-pay

## 2017-04-14 ENCOUNTER — Encounter (HOSPITAL_COMMUNITY): Payer: Self-pay

## 2017-04-15 ENCOUNTER — Other Ambulatory Visit: Payer: Self-pay | Admitting: *Deleted

## 2017-04-15 DIAGNOSIS — R911 Solitary pulmonary nodule: Secondary | ICD-10-CM

## 2017-04-16 ENCOUNTER — Encounter (HOSPITAL_COMMUNITY): Payer: Self-pay

## 2017-04-19 ENCOUNTER — Encounter (HOSPITAL_COMMUNITY)
Admission: RE | Admit: 2017-04-19 | Discharge: 2017-04-19 | Disposition: A | Discharge status: Home / Self Care | Payer: Self-pay | Source: Ambulatory Visit | Attending: Cardiology | Admitting: Cardiology

## 2017-04-21 ENCOUNTER — Encounter (HOSPITAL_COMMUNITY)
Admission: RE | Admit: 2017-04-21 | Discharge: 2017-04-21 | Disposition: A | Discharge status: Home / Self Care | Payer: Self-pay | Source: Ambulatory Visit | Attending: Cardiology | Admitting: Cardiology

## 2017-04-22 DIAGNOSIS — E538 Deficiency of other specified B group vitamins: Secondary | ICD-10-CM | POA: Diagnosis not present

## 2017-04-22 DIAGNOSIS — E784 Other hyperlipidemia: Secondary | ICD-10-CM | POA: Diagnosis not present

## 2017-04-22 DIAGNOSIS — Z125 Encounter for screening for malignant neoplasm of prostate: Secondary | ICD-10-CM | POA: Diagnosis not present

## 2017-04-22 DIAGNOSIS — E038 Other specified hypothyroidism: Secondary | ICD-10-CM | POA: Diagnosis not present

## 2017-04-23 ENCOUNTER — Encounter (HOSPITAL_COMMUNITY)
Admission: RE | Admit: 2017-04-23 | Discharge: 2017-04-23 | Disposition: A | Discharge status: Home / Self Care | Payer: Self-pay | Source: Ambulatory Visit | Attending: Cardiology | Admitting: Cardiology

## 2017-04-28 ENCOUNTER — Encounter (HOSPITAL_COMMUNITY)
Admission: RE | Admit: 2017-04-28 | Discharge: 2017-04-28 | Disposition: A | Discharge status: Home / Self Care | Payer: Self-pay | Source: Ambulatory Visit | Attending: Cardiology | Admitting: Cardiology

## 2017-04-29 DIAGNOSIS — E784 Other hyperlipidemia: Secondary | ICD-10-CM | POA: Diagnosis not present

## 2017-04-29 DIAGNOSIS — D692 Other nonthrombocytopenic purpura: Secondary | ICD-10-CM | POA: Diagnosis not present

## 2017-04-29 DIAGNOSIS — Z23 Encounter for immunization: Secondary | ICD-10-CM | POA: Diagnosis not present

## 2017-04-29 DIAGNOSIS — R918 Other nonspecific abnormal finding of lung field: Secondary | ICD-10-CM | POA: Diagnosis not present

## 2017-04-29 DIAGNOSIS — C439 Malignant melanoma of skin, unspecified: Secondary | ICD-10-CM | POA: Diagnosis not present

## 2017-04-29 DIAGNOSIS — D539 Nutritional anemia, unspecified: Secondary | ICD-10-CM | POA: Diagnosis not present

## 2017-04-29 DIAGNOSIS — E538 Deficiency of other specified B group vitamins: Secondary | ICD-10-CM | POA: Diagnosis not present

## 2017-04-29 DIAGNOSIS — Z1389 Encounter for screening for other disorder: Secondary | ICD-10-CM | POA: Diagnosis not present

## 2017-04-29 DIAGNOSIS — Z6824 Body mass index (BMI) 24.0-24.9, adult: Secondary | ICD-10-CM | POA: Diagnosis not present

## 2017-04-29 DIAGNOSIS — Z0000 Encounter for general adult medical examination without abnormal findings: Secondary | ICD-10-CM | POA: Diagnosis not present

## 2017-04-29 DIAGNOSIS — N400 Enlarged prostate without lower urinary tract symptoms: Secondary | ICD-10-CM | POA: Diagnosis not present

## 2017-04-29 DIAGNOSIS — I252 Old myocardial infarction: Secondary | ICD-10-CM | POA: Diagnosis not present

## 2017-04-30 ENCOUNTER — Encounter (HOSPITAL_COMMUNITY)
Admission: RE | Admit: 2017-04-30 | Discharge: 2017-04-30 | Disposition: A | Discharge status: Home / Self Care | Payer: Self-pay | Source: Ambulatory Visit | Attending: Cardiology | Admitting: Cardiology

## 2017-04-30 DIAGNOSIS — I2510 Atherosclerotic heart disease of native coronary artery without angina pectoris: Secondary | ICD-10-CM | POA: Insufficient documentation

## 2017-04-30 NOTE — Addendum Note (Signed)
Addendum  created 04/30/17 1024 by Rica Koyanagi, MD   Sign clinical note

## 2017-05-03 ENCOUNTER — Encounter (HOSPITAL_COMMUNITY)
Admission: RE | Admit: 2017-05-03 | Discharge: 2017-05-03 | Disposition: A | Discharge status: Home / Self Care | Payer: Self-pay | Source: Ambulatory Visit | Attending: Cardiology | Admitting: Cardiology

## 2017-05-05 ENCOUNTER — Encounter (HOSPITAL_COMMUNITY)
Admission: RE | Admit: 2017-05-05 | Discharge: 2017-05-05 | Disposition: A | Discharge status: Home / Self Care | Payer: Self-pay | Source: Ambulatory Visit | Attending: Cardiology | Admitting: Cardiology

## 2017-05-07 ENCOUNTER — Encounter (HOSPITAL_COMMUNITY)
Admission: RE | Admit: 2017-05-07 | Discharge: 2017-05-07 | Disposition: A | Discharge status: Home / Self Care | Payer: Self-pay | Source: Ambulatory Visit | Attending: Cardiology | Admitting: Cardiology

## 2017-05-10 ENCOUNTER — Encounter (HOSPITAL_COMMUNITY)
Admission: RE | Admit: 2017-05-10 | Discharge: 2017-05-10 | Disposition: A | Discharge status: Home / Self Care | Payer: Self-pay | Source: Ambulatory Visit | Attending: Cardiology | Admitting: Cardiology

## 2017-05-10 DIAGNOSIS — D225 Melanocytic nevi of trunk: Secondary | ICD-10-CM | POA: Diagnosis not present

## 2017-05-10 DIAGNOSIS — D2271 Melanocytic nevi of right lower limb, including hip: Secondary | ICD-10-CM | POA: Diagnosis not present

## 2017-05-10 DIAGNOSIS — L821 Other seborrheic keratosis: Secondary | ICD-10-CM | POA: Diagnosis not present

## 2017-05-10 DIAGNOSIS — L570 Actinic keratosis: Secondary | ICD-10-CM | POA: Diagnosis not present

## 2017-05-10 DIAGNOSIS — Z85820 Personal history of malignant melanoma of skin: Secondary | ICD-10-CM | POA: Diagnosis not present

## 2017-05-12 ENCOUNTER — Encounter (HOSPITAL_COMMUNITY)
Admission: RE | Admit: 2017-05-12 | Discharge: 2017-05-12 | Disposition: A | Discharge status: Home / Self Care | Payer: Self-pay | Source: Ambulatory Visit | Attending: Cardiology | Admitting: Cardiology

## 2017-05-14 ENCOUNTER — Encounter (HOSPITAL_COMMUNITY)
Admission: RE | Admit: 2017-05-14 | Discharge: 2017-05-14 | Disposition: A | Discharge status: Home / Self Care | Payer: Self-pay | Source: Ambulatory Visit | Attending: Cardiology | Admitting: Cardiology

## 2017-05-17 ENCOUNTER — Encounter (HOSPITAL_COMMUNITY): Payer: Self-pay

## 2017-05-17 DIAGNOSIS — H43813 Vitreous degeneration, bilateral: Secondary | ICD-10-CM | POA: Diagnosis not present

## 2017-05-17 DIAGNOSIS — H02831 Dermatochalasis of right upper eyelid: Secondary | ICD-10-CM | POA: Diagnosis not present

## 2017-05-17 DIAGNOSIS — H52203 Unspecified astigmatism, bilateral: Secondary | ICD-10-CM | POA: Diagnosis not present

## 2017-05-17 DIAGNOSIS — Z961 Presence of intraocular lens: Secondary | ICD-10-CM | POA: Diagnosis not present

## 2017-05-19 ENCOUNTER — Encounter (HOSPITAL_COMMUNITY)
Admission: RE | Admit: 2017-05-19 | Discharge: 2017-05-19 | Disposition: A | Discharge status: Home / Self Care | Payer: Self-pay | Source: Ambulatory Visit | Attending: Cardiology | Admitting: Cardiology

## 2017-05-21 ENCOUNTER — Encounter (HOSPITAL_COMMUNITY)
Admission: RE | Admit: 2017-05-21 | Discharge: 2017-05-21 | Disposition: A | Discharge status: Home / Self Care | Payer: Self-pay | Source: Ambulatory Visit | Attending: Cardiology | Admitting: Cardiology

## 2017-05-24 ENCOUNTER — Encounter (HOSPITAL_COMMUNITY)
Admission: RE | Admit: 2017-05-24 | Discharge: 2017-05-24 | Disposition: A | Discharge status: Home / Self Care | Payer: Self-pay | Source: Ambulatory Visit | Attending: Cardiology | Admitting: Cardiology

## 2017-05-26 ENCOUNTER — Ambulatory Visit
Admission: RE | Admit: 2017-05-26 | Discharge: 2017-05-26 | Disposition: A | Discharge status: Home / Self Care | Payer: Medicare Other | Source: Ambulatory Visit | Attending: Surgery | Admitting: Surgery

## 2017-05-26 ENCOUNTER — Ambulatory Visit (INDEPENDENT_AMBULATORY_CARE_PROVIDER_SITE_OTHER): Payer: Medicare Other | Admitting: Surgery

## 2017-05-26 ENCOUNTER — Encounter: Payer: Self-pay | Admitting: Surgery

## 2017-05-26 ENCOUNTER — Encounter (HOSPITAL_COMMUNITY): Payer: Self-pay

## 2017-05-26 VITALS — BP 124/65 | HR 58 | Resp 16 | Ht 70.0 in | Wt 165.0 lb

## 2017-05-26 DIAGNOSIS — Z09 Encounter for follow-up examination after completed treatment for conditions other than malignant neoplasm: Secondary | ICD-10-CM | POA: Diagnosis not present

## 2017-05-26 DIAGNOSIS — J869 Pyothorax without fistula: Secondary | ICD-10-CM

## 2017-05-26 DIAGNOSIS — R911 Solitary pulmonary nodule: Secondary | ICD-10-CM

## 2017-05-26 DIAGNOSIS — R918 Other nonspecific abnormal finding of lung field: Secondary | ICD-10-CM

## 2017-05-26 NOTE — Progress Notes (Signed)
HPI:  Patient returns for  follow-up having undergone left VATS for drainage of an empyema and decortication of the lungon 12/31/2016. Since I last saw him on 02/12/2017 he has been feeling well and feels like his breathing is back to normal. He had a small nodule in the right apex that was seen on his initial CT of 12/29/2016 that required follow up. His last CXR showed a persistent but improving small loculated posterior left pleural effusion.  Current Outpatient Prescriptions  Medication Sig Dispense Refill  . aspirin 81 MG tablet Take 81 mg by mouth daily.    . clopidogrel (PLAVIX) 75 MG tablet Take 1 tablet (75 mg total) by mouth daily. 30 tablet 11  . levothyroxine (SYNTHROID, LEVOTHROID) 112 MCG tablet Take 112 mcg by mouth daily.    . nitroGLYCERIN (NITROSTAT) 0.4 MG SL tablet Place 1 tablet (0.4 mg total) under the tongue as needed. 25 tablet 3  . pravastatin (PRAVACHOL) 10 MG tablet TAKE ONE TABLET BY MOUTH DAILY 90 tablet 10  . vitamin B-12 (CYANOCOBALAMIN) 1000 MCG tablet Take 1,000 mcg by mouth daily.     No current facility-administered medications for this visit.      Physical Exam: BP 124/65 (BP Location: Right Arm, Patient Position: Sitting, Cuff Size: Normal)   Pulse (!) 58   Resp 16   Ht 5\' 10"  (1.778 m)   Wt 165 lb (74.8 kg)   SpO2 95% Comment: ON RA  BMI 23.68 kg/m  He looks well Lungs are clear The left chest incisions are well-healed  Diagnostic Tests:  CLINICAL DATA:  Follow-up right upper lobe nodule.  EXAM: CT CHEST WITHOUT CONTRAST  TECHNIQUE: Multidetector CT imaging of the chest was performed following the standard protocol without IV contrast.  COMPARISON:  December 29, 2016 CTA of the chest  FINDINGS: Cardiovascular: No thoracic aortic aneurysm. Minimal atherosclerotic change. Coronary artery calcifications identified. The heart is unchanged.  Mediastinum/Nodes: No adenopathy. Small left effusion in the pleural space. No other  mediastinal abnormalities identified.  Lungs/Pleura: Central airways are normal. There is a nodule in the right upper lobe abutting the pleura on axial image 20 and coronal image 65. Positioning is quite different between today's study and the previous CTA of the chest. The nodule measures 7 by 4.3 x 5.7 mm today versus 6 by 5 by 5 mm on the CTA of the chest from December 29, 2016. The small differences in measurement may be due to difference in slice selection. A nodule in the right upper lobe on series 4, image 37 is stable. Mild nodularity along the right minor fissure may be unchanged given difference in slice selection. A tiny nodule in the right lung on series 4, image 62 is stable. A nodule in the right base on series 4, image 72 measures 4.7 mm. This was not seen previously, possibly due to respiratory motion. A few other tiny nodules were also not as well assessed previously, possibly due to respiratory motion. A nodule in the left base on image 79 was not seen previously due to an effusion, measuring 7.6 mm today. Several other tiny nodules on the left such as on image 73 were also not well evaluated previously. No masses. There is a small left-sided pleural effusion with atelectasis. No suspicious infiltrate identified.  Upper Abdomen: Limited views of the upper abdomen demonstrate no changes.  Musculoskeletal: No chest wall mass or suspicious bone lesions identified.  IMPRESSION: 1. There are multiple pulmonary nodules in each  lung. The largest in the right apex is similar in the interval. The slight increased prominence today may be due to difference in slice selection. Multiple nodules are clearly stable. Others are seen on today's study but not the previous study possibly all explained by respiratory motion and a left-sided effusion previously. Recommend a follow-up CT scan in 3 months to ensure stability. 2. Atherosclerosis. 3. Coronary artery  calcifications.  Aortic Atherosclerosis (ICD10-I70.0).   Electronically Signed   By: Dorise Bullion III M.D   On: 05/26/2017 13:23   Impression:  I have personally reviewed and interpreted his CT of the chest. The left pleural effusion is small and resolving. There are multiple nodules in both lungs. The largest one in the right apex is about 7 x 6 mm and unchanged. All of the other nodules are small and some were seen previously and some not, probably due to the pneumonia and effusion on the left. I think it would be best to repeat his CT in 4 months to follow up on this. I reviewed the CT images with him and answered his questions.   Plan:  I will see him back in 4 months with a CT of the chest to follow up on the lung nodules.    I spent 15 minutes performing this established patient evaluation and > 50% of this time was spent face to face counseling and coordinating the care of this patient's lung nodules and loculated left pleural effusion.    Gaye Pollack, MD [facility] [phone]

## 2017-05-28 ENCOUNTER — Encounter (HOSPITAL_COMMUNITY): Payer: Self-pay

## 2017-05-31 ENCOUNTER — Encounter (HOSPITAL_COMMUNITY): Payer: Self-pay

## 2017-05-31 DIAGNOSIS — I2510 Atherosclerotic heart disease of native coronary artery without angina pectoris: Secondary | ICD-10-CM | POA: Insufficient documentation

## 2017-06-04 ENCOUNTER — Encounter (HOSPITAL_COMMUNITY)
Admission: RE | Admit: 2017-06-04 | Discharge: 2017-06-04 | Disposition: A | Discharge status: Home / Self Care | Payer: Self-pay | Source: Ambulatory Visit | Attending: Cardiology | Admitting: Cardiology

## 2017-06-07 ENCOUNTER — Encounter (HOSPITAL_COMMUNITY): Payer: Self-pay

## 2017-06-09 ENCOUNTER — Encounter (HOSPITAL_COMMUNITY): Payer: Self-pay

## 2017-06-11 ENCOUNTER — Encounter (HOSPITAL_COMMUNITY)
Admission: RE | Admit: 2017-06-11 | Discharge: 2017-06-11 | Disposition: A | Discharge status: Home / Self Care | Payer: Self-pay | Source: Ambulatory Visit | Attending: Cardiology | Admitting: Cardiology

## 2017-06-14 ENCOUNTER — Encounter (HOSPITAL_COMMUNITY): Payer: Self-pay

## 2017-06-16 ENCOUNTER — Encounter (HOSPITAL_COMMUNITY)
Admission: RE | Admit: 2017-06-16 | Discharge: 2017-06-16 | Disposition: A | Discharge status: Home / Self Care | Payer: Self-pay | Source: Ambulatory Visit | Attending: Cardiology | Admitting: Cardiology

## 2017-06-18 ENCOUNTER — Encounter (HOSPITAL_COMMUNITY): Payer: Self-pay

## 2017-06-21 ENCOUNTER — Encounter (HOSPITAL_COMMUNITY): Payer: Self-pay

## 2017-06-23 ENCOUNTER — Encounter (HOSPITAL_COMMUNITY)
Admission: RE | Admit: 2017-06-23 | Discharge: 2017-06-23 | Disposition: A | Discharge status: Home / Self Care | Payer: Self-pay | Source: Ambulatory Visit | Attending: Cardiology | Admitting: Cardiology

## 2017-06-25 ENCOUNTER — Encounter (HOSPITAL_COMMUNITY)
Admission: RE | Admit: 2017-06-25 | Discharge: 2017-06-25 | Disposition: A | Discharge status: Home / Self Care | Payer: Self-pay | Source: Ambulatory Visit | Attending: Cardiology | Admitting: Cardiology

## 2017-06-26 ENCOUNTER — Other Ambulatory Visit: Payer: Self-pay | Admitting: Cardiology

## 2017-06-28 ENCOUNTER — Encounter (HOSPITAL_COMMUNITY): Payer: Self-pay

## 2017-06-30 ENCOUNTER — Encounter (HOSPITAL_COMMUNITY): Payer: Self-pay

## 2017-06-30 DIAGNOSIS — I2510 Atherosclerotic heart disease of native coronary artery without angina pectoris: Secondary | ICD-10-CM | POA: Insufficient documentation

## 2017-07-02 ENCOUNTER — Encounter (HOSPITAL_COMMUNITY)
Admission: RE | Admit: 2017-07-02 | Discharge: 2017-07-02 | Disposition: A | Discharge status: Home / Self Care | Payer: Self-pay | Source: Ambulatory Visit | Attending: Cardiology | Admitting: Cardiology

## 2017-07-05 ENCOUNTER — Encounter (HOSPITAL_COMMUNITY): Payer: Self-pay

## 2017-07-07 ENCOUNTER — Encounter (HOSPITAL_COMMUNITY): Payer: Self-pay

## 2017-07-09 ENCOUNTER — Encounter (HOSPITAL_COMMUNITY): Payer: Self-pay

## 2017-07-12 ENCOUNTER — Encounter (HOSPITAL_COMMUNITY): Payer: Self-pay

## 2017-07-14 ENCOUNTER — Encounter (HOSPITAL_COMMUNITY)
Admission: RE | Admit: 2017-07-14 | Discharge: 2017-07-14 | Disposition: A | Discharge status: Home / Self Care | Payer: Self-pay | Source: Ambulatory Visit | Attending: Cardiology | Admitting: Cardiology

## 2017-07-16 ENCOUNTER — Encounter (HOSPITAL_COMMUNITY)
Admission: RE | Admit: 2017-07-16 | Discharge: 2017-07-16 | Disposition: A | Discharge status: Home / Self Care | Payer: Self-pay | Source: Ambulatory Visit | Attending: Cardiology | Admitting: Cardiology

## 2017-07-19 ENCOUNTER — Encounter (HOSPITAL_COMMUNITY)
Admission: RE | Admit: 2017-07-19 | Discharge: 2017-07-19 | Disposition: A | Discharge status: Home / Self Care | Payer: Self-pay | Source: Ambulatory Visit | Attending: Cardiology | Admitting: Cardiology

## 2017-07-21 ENCOUNTER — Encounter (HOSPITAL_COMMUNITY)
Admission: RE | Admit: 2017-07-21 | Discharge: 2017-07-21 | Disposition: A | Discharge status: Home / Self Care | Payer: Self-pay | Source: Ambulatory Visit | Attending: Cardiology | Admitting: Cardiology

## 2017-07-23 ENCOUNTER — Encounter (HOSPITAL_COMMUNITY)
Admission: RE | Admit: 2017-07-23 | Discharge: 2017-07-23 | Disposition: A | Discharge status: Home / Self Care | Payer: Self-pay | Source: Ambulatory Visit | Attending: Cardiology | Admitting: Cardiology

## 2017-07-26 ENCOUNTER — Encounter (HOSPITAL_COMMUNITY): Payer: Self-pay

## 2017-07-28 ENCOUNTER — Encounter (HOSPITAL_COMMUNITY)
Admission: RE | Admit: 2017-07-28 | Discharge: 2017-07-28 | Disposition: A | Discharge status: Home / Self Care | Payer: Medicare Other | Source: Ambulatory Visit | Attending: Cardiology | Admitting: Cardiology

## 2017-07-30 ENCOUNTER — Encounter (HOSPITAL_COMMUNITY)
Admission: RE | Admit: 2017-07-30 | Discharge: 2017-07-30 | Disposition: A | Discharge status: Home / Self Care | Payer: Self-pay | Source: Ambulatory Visit | Attending: Cardiology | Admitting: Cardiology

## 2017-08-04 ENCOUNTER — Encounter (HOSPITAL_COMMUNITY): Payer: Self-pay

## 2017-08-04 DIAGNOSIS — I2510 Atherosclerotic heart disease of native coronary artery without angina pectoris: Secondary | ICD-10-CM | POA: Insufficient documentation

## 2017-08-06 ENCOUNTER — Encounter (HOSPITAL_COMMUNITY)
Admission: RE | Admit: 2017-08-06 | Discharge: 2017-08-06 | Disposition: A | Discharge status: Home / Self Care | Payer: Self-pay | Source: Ambulatory Visit | Attending: Cardiology | Admitting: Cardiology

## 2017-08-09 ENCOUNTER — Encounter (HOSPITAL_COMMUNITY)
Admission: RE | Admit: 2017-08-09 | Discharge: 2017-08-09 | Disposition: A | Discharge status: Home / Self Care | Payer: Self-pay | Source: Ambulatory Visit | Attending: Cardiology | Admitting: Cardiology

## 2017-08-11 ENCOUNTER — Encounter (HOSPITAL_COMMUNITY)
Admission: RE | Admit: 2017-08-11 | Discharge: 2017-08-11 | Disposition: A | Discharge status: Home / Self Care | Payer: Self-pay | Source: Ambulatory Visit | Attending: Cardiology | Admitting: Cardiology

## 2017-08-13 ENCOUNTER — Encounter (HOSPITAL_COMMUNITY)
Admission: RE | Admit: 2017-08-13 | Discharge: 2017-08-13 | Disposition: A | Discharge status: Home / Self Care | Payer: Self-pay | Source: Ambulatory Visit | Attending: Cardiology | Admitting: Cardiology

## 2017-08-16 ENCOUNTER — Encounter (HOSPITAL_COMMUNITY)
Admission: RE | Admit: 2017-08-16 | Discharge: 2017-08-16 | Disposition: A | Discharge status: Home / Self Care | Payer: Self-pay | Source: Ambulatory Visit | Attending: Cardiology | Admitting: Cardiology

## 2017-08-18 ENCOUNTER — Encounter (HOSPITAL_COMMUNITY): Payer: Self-pay

## 2017-08-19 ENCOUNTER — Telehealth: Payer: Self-pay | Admitting: Cardiology

## 2017-08-19 MED ORDER — CLOPIDOGREL BISULFATE 75 MG PO TABS: 75.0000 mg | ORAL_TABLET | Freq: Every day | ORAL | 0 refills | Status: DC

## 2017-08-19 NOTE — Telephone Encounter (Signed)
Pt calling requesting a refill of clopidogrel 75 mg tablet. Pt has not been seen since 7/17. Pt would like a call back on cell phone [phone]. Please address

## 2017-08-19 NOTE — Telephone Encounter (Signed)
Spoke w patient, he is aware he is overdue for visit. Notes he has plenty of plavix on-hand but wanted to schedule appt to see Dr. [place] as it has been over 1 year since he was last seen.  I scheduled for a mid-November appt - pt agreeable to this as he notes he will be out of town for most of October.  I also informed pt I sent Rx authorization as a "fill later" to his preferred pharmacy. Advised to call in the interim if he needs anything else. Pt voiced thanks for prompt call back.

## 2017-08-20 ENCOUNTER — Encounter (HOSPITAL_COMMUNITY)
Admission: RE | Admit: 2017-08-20 | Discharge: 2017-08-20 | Disposition: A | Discharge status: Home / Self Care | Payer: Self-pay | Source: Ambulatory Visit | Attending: Cardiology | Admitting: Cardiology

## 2017-08-20 ENCOUNTER — Other Ambulatory Visit: Payer: Self-pay | Admitting: Surgery

## 2017-08-20 DIAGNOSIS — R918 Other nonspecific abnormal finding of lung field: Secondary | ICD-10-CM

## 2017-08-23 ENCOUNTER — Encounter (HOSPITAL_COMMUNITY): Payer: Self-pay

## 2017-08-25 ENCOUNTER — Encounter (HOSPITAL_COMMUNITY)
Admission: RE | Admit: 2017-08-25 | Discharge: 2017-08-25 | Disposition: A | Discharge status: Home / Self Care | Payer: Self-pay | Source: Ambulatory Visit | Attending: Cardiology | Admitting: Cardiology

## 2017-08-26 DIAGNOSIS — L812 Freckles: Secondary | ICD-10-CM | POA: Diagnosis not present

## 2017-08-26 DIAGNOSIS — L570 Actinic keratosis: Secondary | ICD-10-CM | POA: Diagnosis not present

## 2017-08-26 DIAGNOSIS — Z85820 Personal history of malignant melanoma of skin: Secondary | ICD-10-CM | POA: Diagnosis not present

## 2017-08-26 DIAGNOSIS — D225 Melanocytic nevi of trunk: Secondary | ICD-10-CM | POA: Diagnosis not present

## 2017-08-26 DIAGNOSIS — L821 Other seborrheic keratosis: Secondary | ICD-10-CM | POA: Diagnosis not present

## 2017-08-27 ENCOUNTER — Encounter (HOSPITAL_COMMUNITY)
Admission: RE | Admit: 2017-08-27 | Discharge: 2017-08-27 | Disposition: A | Discharge status: Home / Self Care | Payer: Medicare Other | Source: Ambulatory Visit | Attending: Cardiology | Admitting: Cardiology

## 2017-08-28 DIAGNOSIS — Z23 Encounter for immunization: Secondary | ICD-10-CM | POA: Diagnosis not present

## 2017-08-30 ENCOUNTER — Encounter (HOSPITAL_COMMUNITY)
Admission: RE | Admit: 2017-08-30 | Discharge: 2017-08-30 | Disposition: A | Discharge status: Home / Self Care | Payer: Self-pay | Source: Ambulatory Visit | Attending: Cardiology | Admitting: Cardiology

## 2017-08-30 DIAGNOSIS — I2510 Atherosclerotic heart disease of native coronary artery without angina pectoris: Secondary | ICD-10-CM | POA: Insufficient documentation

## 2017-09-01 ENCOUNTER — Encounter (HOSPITAL_COMMUNITY)
Admission: RE | Admit: 2017-09-01 | Discharge: 2017-09-01 | Disposition: A | Discharge status: Home / Self Care | Payer: Self-pay | Source: Ambulatory Visit | Attending: Cardiology | Admitting: Cardiology

## 2017-09-03 ENCOUNTER — Encounter (HOSPITAL_COMMUNITY)
Admission: RE | Admit: 2017-09-03 | Discharge: 2017-09-03 | Disposition: A | Discharge status: Home / Self Care | Payer: Self-pay | Source: Ambulatory Visit | Attending: Cardiology | Admitting: Cardiology

## 2017-09-06 ENCOUNTER — Encounter (HOSPITAL_COMMUNITY): Payer: Self-pay

## 2017-09-08 ENCOUNTER — Encounter (HOSPITAL_COMMUNITY): Payer: Self-pay

## 2017-09-10 ENCOUNTER — Encounter (HOSPITAL_COMMUNITY): Payer: Self-pay

## 2017-09-13 ENCOUNTER — Encounter (HOSPITAL_COMMUNITY): Payer: Self-pay

## 2017-09-15 ENCOUNTER — Encounter (HOSPITAL_COMMUNITY): Payer: Self-pay

## 2017-09-17 ENCOUNTER — Encounter (HOSPITAL_COMMUNITY): Payer: Self-pay

## 2017-09-20 ENCOUNTER — Encounter (HOSPITAL_COMMUNITY): Payer: Self-pay

## 2017-09-22 ENCOUNTER — Encounter (HOSPITAL_COMMUNITY): Payer: Self-pay

## 2017-09-24 ENCOUNTER — Encounter (HOSPITAL_COMMUNITY): Payer: Self-pay

## 2017-09-25 ENCOUNTER — Other Ambulatory Visit: Payer: Self-pay | Admitting: Cardiology

## 2017-09-27 ENCOUNTER — Encounter (HOSPITAL_COMMUNITY): Payer: Self-pay

## 2017-09-29 ENCOUNTER — Ambulatory Visit: Payer: Medicare Other | Admitting: Surgery

## 2017-09-29 ENCOUNTER — Encounter (HOSPITAL_COMMUNITY)
Admission: RE | Admit: 2017-09-29 | Discharge: 2017-09-29 | Disposition: A | Discharge status: Home / Self Care | Payer: Self-pay | Source: Ambulatory Visit | Attending: Cardiology | Admitting: Cardiology

## 2017-09-29 ENCOUNTER — Ambulatory Visit
Admission: RE | Admit: 2017-09-29 | Discharge: 2017-09-29 | Disposition: A | Discharge status: Home / Self Care | Payer: Medicare Other | Source: Ambulatory Visit | Attending: Surgery | Admitting: Surgery

## 2017-09-29 DIAGNOSIS — R918 Other nonspecific abnormal finding of lung field: Secondary | ICD-10-CM

## 2017-10-01 ENCOUNTER — Encounter (HOSPITAL_COMMUNITY)
Admission: RE | Admit: 2017-10-01 | Discharge: 2017-10-01 | Disposition: A | Discharge status: Home / Self Care | Payer: Medicare Other | Source: Ambulatory Visit | Attending: Cardiology | Admitting: Cardiology

## 2017-10-01 DIAGNOSIS — I2510 Atherosclerotic heart disease of native coronary artery without angina pectoris: Secondary | ICD-10-CM | POA: Insufficient documentation

## 2017-10-04 ENCOUNTER — Encounter (HOSPITAL_COMMUNITY)
Admission: RE | Admit: 2017-10-04 | Discharge: 2017-10-04 | Disposition: A | Discharge status: Home / Self Care | Payer: Self-pay | Source: Ambulatory Visit | Attending: Cardiology | Admitting: Cardiology

## 2017-10-06 ENCOUNTER — Encounter: Payer: Self-pay | Admitting: Surgery

## 2017-10-06 ENCOUNTER — Encounter (HOSPITAL_COMMUNITY): Payer: Self-pay

## 2017-10-06 ENCOUNTER — Ambulatory Visit (INDEPENDENT_AMBULATORY_CARE_PROVIDER_SITE_OTHER): Payer: Medicare Other | Admitting: Surgery

## 2017-10-06 ENCOUNTER — Other Ambulatory Visit: Payer: Self-pay

## 2017-10-06 VITALS — BP 134/75 | HR 52 | Ht 70.0 in | Wt 170.0 lb

## 2017-10-06 DIAGNOSIS — R918 Other nonspecific abnormal finding of lung field: Secondary | ICD-10-CM

## 2017-10-07 NOTE — Progress Notes (Signed)
Joseph Roman Date of Birth: September 19, 1930 Medical Record #[id number]  History of Present Illness: [name] is seen for yearly followup of CAD. He has a history of coronary disease and is status post inferior lateral myocardial infarction in June of 2009. He had stenting of the mid left circumflex coronary emergently with a 2.75 x 23 mm Promus stent. His last stress test in July of 2015 was without ischemia at excellent exercise level. He is s/p VATS and drainage of a left pleural effusion by Dr. Cyndia Bent in February 2018 for empyema with PNA.   On followup today he is doing very well. He denies any chest pain, SOB, palpitations, or dizziness. No edema. He is still working full time and going to maintenance cardiac Rehab for exercise. Feels he has completely recovered from his PNA.  Current Outpatient Medications on File Prior to Visit  Medication Sig Dispense Refill  . aspirin 81 MG tablet Take 81 mg by mouth daily.    Marland Kitchen levothyroxine (SYNTHROID, LEVOTHROID) 112 MCG tablet Take 112 mcg by mouth daily.    . nitroGLYCERIN (NITROSTAT) 0.4 MG SL tablet Place 1 tablet (0.4 mg total) under the tongue as needed. 25 tablet 3  . pravastatin (PRAVACHOL) 10 MG tablet TAKE ONE TABLET BY MOUTH DAILY 90 tablet 10  . vitamin B-12 (CYANOCOBALAMIN) 1000 MCG tablet Take 1,000 mcg by mouth daily.     No current facility-administered medications on file prior to visit.     No Known Allergies  Past Medical History:  Diagnosis Date  . Acute inferolateral myocardial infarction ([place]) 04/2008   2.75x23 Promus Lcx  . Arthritis    "comes and goes; hits everywhere" (12/29/2016)  . Coronary artery disease   . Dyslipidemia    Mild dyslipidemia  . GERD (gastroesophageal reflux disease)   . Hypothyroidism   . Pneumonia 12/29/2016    Past Surgical History:  Procedure Laterality Date  . CATARACT EXTRACTION W/ INTRAOCULAR LENS  IMPLANT, BILATERAL Bilateral 2010-2016   "right-left"  . COLONOSCOPY W/  BIOPSIES AND POLYPECTOMY  04/21/2005  . CORONARY ANGIOPLASTY WITH STENT PLACEMENT  05/07/2008   Ejection fraction was estimated at 60%  . TONSILLECTOMY      Social History   Tobacco Use  Smoking Status Never Smoker  Smokeless Tobacco Never Used    Social History   Substance and Sexual Activity  Alcohol Use Yes  . Alcohol/week: 0.6 oz  . Types: 1 Cans of beer per week    Family History  Problem Relation Age of Onset  . Cancer - Lung Father     Review of Systems: As noted in history of present illness.  All other systems were reviewed and are negative.  Physical Exam: BP (!) 150/90   Pulse (!) 55   Ht 5\' 10"  (1.778 m)   Wt 177 lb (80.3 kg)   BMI 25.40 kg/m  GENERAL:  Well appearing WM appears younger than stated age.  HEENT:  PERRL, EOMI, sclera are clear. Oropharynx is clear. NECK:  No jugular venous distention, carotid upstroke brisk and symmetric, no bruits, no thyromegaly or adenopathy LUNGS:  Clear to auscultation bilaterally CHEST:  Unremarkable HEART:  RRR,  PMI not displaced or sustained,S1 and S2 within normal limits, no S3, no S4: no clicks, no rubs, soft 1/6 systolic murmur RUSB ABD:  Soft, nontender. BS +, no masses or bruits. No hepatomegaly, no splenomegaly EXT:  2 + pulses throughout, no edema, no cyanosis no clubbing SKIN:  Warm and dry.  No rashes NEURO:  Alert and oriented x 3. Cranial nerves II through XII intact. PSYCH:  Cognitively intact    LABORATORY DATA:  Lab Results  Component Value Date   WBC 14.8 (H) 01/04/2017   HGB 9.0 (L) 01/04/2017   HCT 27.5 (L) 01/04/2017   PLT 618 (H) 01/04/2017   GLUCOSE 107 (H) 01/04/2017   CHOL  05/08/2008    102        ATP III CLASSIFICATION:  <200     mg/dL   Desirable  200-239  mg/dL   Borderline High  >=240    mg/dL   High   TRIG 71 05/08/2008   HDL 21 (L) 05/08/2008   LDLCALC  05/08/2008    67        Total Cholesterol/HDL:CHD Risk Coronary Heart Disease Risk Table                     Men    Women  1/2 Average Risk   3.4   3.3   ALT 37 01/02/2017   AST 34 01/02/2017   NA 138 01/04/2017   K 4.5 01/04/2017   CL 102 01/04/2017   CREATININE 1.02 01/04/2017   BUN 13 01/04/2017   CO2 29 01/04/2017   TSH 2.084 Test methodology is 3rd generation TSH 05/07/2008   INR 1.24 12/29/2016   HGBA1C  05/07/2008    5.6 (NOTE)   The ADA recommends the following therapeutic goals for glycemic   control related to Hgb A1C measurement:   Goal of Therapy:   < 7.0% Hgb A1C   Action Suggested:  > 8.0% Hgb A1C   Ref:  Diabetes Care, 22, Suppl. 1, 1999    ECG today shows sinus bradycardia with a rate of 55 beats per minute. It is otherwise normal. I have personally reviewed and interpreted this study.  Labs reviewed from Dr. Virgina Jock 04/12/15: Normal CBC, chemistry panel, TSH and PSA. Cholesterol- 117, trig-53, HDL-40, LDL 66. Dated 04/22/17: cholesterol 115, triglycerides 41, HDL 38, LDL 69. Hgb, CMET and TSH normal.    Assessment / Plan: 1. Coronary disease with remote myocardial infarction status post stenting of the left circumflex coronary in 2009. GXT negative in July 2015. Continue DAPT and statin. He remains asymptomatic.  2. Dyslipidemia-well controlled on statin therapy. At target.

## 2017-10-08 ENCOUNTER — Encounter (HOSPITAL_COMMUNITY)
Admission: RE | Admit: 2017-10-08 | Discharge: 2017-10-08 | Disposition: A | Discharge status: Home / Self Care | Payer: Self-pay | Source: Ambulatory Visit | Attending: Cardiology | Admitting: Cardiology

## 2017-10-10 ENCOUNTER — Encounter: Payer: Self-pay | Admitting: Surgery

## 2017-10-10 NOTE — Progress Notes (Signed)
HPI:  Patient returns for follow-up having undergone left VATS for drainage of an empyema and decortication of the lungon 12/31/2016. Since I last saw him on 05/26/2017 he has been feeling well and feels like his breathing is back to normal. He had a small nodule in the right apex that was seen on his initial CT of 12/29/2016 that required follow up. His last chest CT showed the left pleural effusion was small and resolving. There were multiple nodules in both lungs. The largest one in the right apex was about 7 x 6 mm and unchanged. All of the other nodules were small and some were seen previously and some not, probably due to the pneumonia and effusion on the left. We decided to follow this up on CT in 4 months.   Current Outpatient Medications  Medication Sig Dispense Refill  . aspirin 81 MG tablet Take 81 mg by mouth daily.    . clopidogrel (PLAVIX) 75 MG tablet Take 1 tablet (75 mg total) by mouth daily. 90 tablet 0  . levothyroxine (SYNTHROID, LEVOTHROID) 112 MCG tablet Take 112 mcg by mouth daily.    . nitroGLYCERIN (NITROSTAT) 0.4 MG SL tablet Place 1 tablet (0.4 mg total) under the tongue as needed. 25 tablet 3  . pravastatin (PRAVACHOL) 10 MG tablet TAKE ONE TABLET BY MOUTH DAILY 90 tablet 10  . vitamin B-12 (CYANOCOBALAMIN) 1000 MCG tablet Take 1,000 mcg by mouth daily.     No current facility-administered medications for this visit.      Physical Exam: BP 134/75   Pulse (!) 52   Ht 5\' 10"  (1.778 m)   Wt 170 lb (77.1 kg)   SpO2 96%   BMI 24.39 kg/m  He looks well There is no cervical or supraclavicular adenopathy Lungs are clear Chest incisions are well-healed  Diagnostic Tests:  CLINICAL DATA:  History of pulmonary nodules.  Subsequent encounter.  EXAM: CT CHEST WITHOUT CONTRAST  TECHNIQUE: Multidetector CT imaging of the chest was performed following the standard protocol without IV contrast.  COMPARISON:  CT chest 12/29/2016 and  05/26/2017.  FINDINGS: Cardiovascular: Scattered calcific coronary artery calcifications are identified. There is also some atherosclerotic vascular disease of the aorta. Heart size is normal. No pericardial effusion.  Mediastinum/Nodes: No enlarged mediastinal or axillary lymph nodes. Thyroid gland, trachea, and esophagus demonstrate no significant findings.  Lungs/Pleura: Very small left pleural effusion seen on the most recent examination has resolved. There is some mild dependent atelectasis and scar in the left lung base. Scattered pulmonary nodules best seen on the most recent examination are unchanged. Largest single nodule is present in the right upper lobe measuring 0.7 x 0.4 cm on image 24. No new or enlarging pulmonary nodule is identified.  Upper Abdomen: Atherosclerosis noted.  Otherwise negative.  Musculoskeletal: No lytic or sclerotic lesion. There is thoracic spondylosis.  IMPRESSION: No change in scattered subcentimeter pulmonary nodule since the most recent examination. Since the nodules are stable on today's exam, then future CT at 18-24 months (from today's scan) is considered optional for low-risk patients, but is recommended for high-risk patients. This recommendation follows the consensus statement: Guidelines for Management of Incidental Pulmonary Nodules Detected on CT Images: From the Fleischner Society 2017; Radiology 2017; 284:228-243.  Calcific coronary artery disease.  Aortic Atherosclerosis (ICD10-I70.0).   Electronically Signed   By: Inge Rise M.D.   On: 09/29/2017 11:32  Impression:  There is a very small left pleural effusion remaining. All of the scattered  lung nodules are unchanged and small. I think it is reasonable to do a follow up CT in 18 months to be sure that there is no change. He is 87 but still very vigorous. I reviewed the CT images with him and answered his questions.  Plan:  Follow up in 18 months with  a CT of the chest.  I spent 15 minutes performing this established patient evaluation and > 50% of this time was spent face to face counseling and coordinating the care of this patient's multiple lung nodules.    Gaye Pollack, MD [facility] [phone]

## 2017-10-11 ENCOUNTER — Encounter (HOSPITAL_COMMUNITY): Payer: Self-pay

## 2017-10-11 ENCOUNTER — Ambulatory Visit (INDEPENDENT_AMBULATORY_CARE_PROVIDER_SITE_OTHER): Payer: Medicare Other | Admitting: Cardiology

## 2017-10-11 ENCOUNTER — Encounter: Payer: Self-pay | Admitting: Cardiology

## 2017-10-11 VITALS — BP 150/90 | HR 55 | Ht 70.0 in | Wt 177.0 lb

## 2017-10-11 DIAGNOSIS — E785 Hyperlipidemia, unspecified: Secondary | ICD-10-CM | POA: Diagnosis not present

## 2017-10-11 DIAGNOSIS — I2510 Atherosclerotic heart disease of native coronary artery without angina pectoris: Secondary | ICD-10-CM | POA: Diagnosis not present

## 2017-10-11 MED ORDER — CLOPIDOGREL BISULFATE 75 MG PO TABS: 75.0000 mg | ORAL_TABLET | Freq: Every day | ORAL | 0 refills | Status: DC

## 2017-10-11 NOTE — Patient Instructions (Signed)
Continue your current therapy  I will see you in one year   

## 2017-10-13 ENCOUNTER — Encounter (HOSPITAL_COMMUNITY): Payer: Self-pay

## 2017-10-15 ENCOUNTER — Encounter (HOSPITAL_COMMUNITY)
Admission: RE | Admit: 2017-10-15 | Discharge: 2017-10-15 | Disposition: A | Discharge status: Home / Self Care | Payer: Medicare Other | Source: Ambulatory Visit | Attending: Cardiology | Admitting: Cardiology

## 2017-10-18 ENCOUNTER — Encounter (HOSPITAL_COMMUNITY)
Admission: RE | Admit: 2017-10-18 | Discharge: 2017-10-18 | Disposition: A | Discharge status: Home / Self Care | Payer: Self-pay | Source: Ambulatory Visit | Attending: Cardiology | Admitting: Cardiology

## 2017-10-20 ENCOUNTER — Encounter (HOSPITAL_COMMUNITY)
Admission: RE | Admit: 2017-10-20 | Discharge: 2017-10-20 | Disposition: A | Discharge status: Home / Self Care | Payer: Self-pay | Source: Ambulatory Visit | Attending: Cardiology | Admitting: Cardiology

## 2017-10-25 ENCOUNTER — Encounter (HOSPITAL_COMMUNITY)
Admission: RE | Admit: 2017-10-25 | Discharge: 2017-10-25 | Disposition: A | Discharge status: Home / Self Care | Payer: Self-pay | Source: Ambulatory Visit | Attending: Cardiology | Admitting: Cardiology

## 2017-10-27 ENCOUNTER — Encounter (HOSPITAL_COMMUNITY)
Admission: RE | Admit: 2017-10-27 | Discharge: 2017-10-27 | Disposition: A | Discharge status: Home / Self Care | Payer: Self-pay | Source: Ambulatory Visit | Attending: Cardiology | Admitting: Cardiology

## 2017-10-28 DIAGNOSIS — R918 Other nonspecific abnormal finding of lung field: Secondary | ICD-10-CM | POA: Diagnosis not present

## 2017-10-28 DIAGNOSIS — I2510 Atherosclerotic heart disease of native coronary artery without angina pectoris: Secondary | ICD-10-CM | POA: Diagnosis not present

## 2017-10-28 DIAGNOSIS — C439 Malignant melanoma of skin, unspecified: Secondary | ICD-10-CM | POA: Diagnosis not present

## 2017-10-28 DIAGNOSIS — E038 Other specified hypothyroidism: Secondary | ICD-10-CM | POA: Diagnosis not present

## 2017-10-28 DIAGNOSIS — D692 Other nonthrombocytopenic purpura: Secondary | ICD-10-CM | POA: Diagnosis not present

## 2017-10-28 DIAGNOSIS — R030 Elevated blood-pressure reading, without diagnosis of hypertension: Secondary | ICD-10-CM | POA: Diagnosis not present

## 2017-10-28 DIAGNOSIS — Z6825 Body mass index (BMI) 25.0-25.9, adult: Secondary | ICD-10-CM | POA: Diagnosis not present

## 2017-10-29 ENCOUNTER — Encounter (HOSPITAL_COMMUNITY)
Admission: RE | Admit: 2017-10-29 | Discharge: 2017-10-29 | Disposition: A | Discharge status: Home / Self Care | Payer: Self-pay | Source: Ambulatory Visit | Attending: Cardiology | Admitting: Cardiology

## 2017-11-01 ENCOUNTER — Encounter (HOSPITAL_COMMUNITY)
Admission: RE | Admit: 2017-11-01 | Discharge: 2017-11-01 | Disposition: A | Discharge status: Home / Self Care | Payer: Self-pay | Source: Ambulatory Visit | Attending: Cardiology | Admitting: Cardiology

## 2017-11-01 DIAGNOSIS — I2510 Atherosclerotic heart disease of native coronary artery without angina pectoris: Secondary | ICD-10-CM | POA: Insufficient documentation

## 2017-11-03 ENCOUNTER — Encounter (HOSPITAL_COMMUNITY)
Admission: RE | Admit: 2017-11-03 | Discharge: 2017-11-03 | Disposition: A | Discharge status: Home / Self Care | Payer: Self-pay | Source: Ambulatory Visit | Attending: Cardiology | Admitting: Cardiology

## 2017-11-05 ENCOUNTER — Encounter (HOSPITAL_COMMUNITY)
Admission: RE | Admit: 2017-11-05 | Discharge: 2017-11-05 | Disposition: A | Discharge status: Home / Self Care | Payer: Self-pay | Source: Ambulatory Visit | Attending: Cardiology | Admitting: Cardiology

## 2017-11-10 ENCOUNTER — Encounter (HOSPITAL_COMMUNITY)
Admission: RE | Admit: 2017-11-10 | Discharge: 2017-11-10 | Disposition: A | Discharge status: Home / Self Care | Payer: Self-pay | Source: Ambulatory Visit | Attending: Cardiology | Admitting: Cardiology

## 2017-11-12 ENCOUNTER — Encounter (HOSPITAL_COMMUNITY)
Admission: RE | Admit: 2017-11-12 | Discharge: 2017-11-12 | Disposition: A | Discharge status: Home / Self Care | Payer: Self-pay | Source: Ambulatory Visit | Attending: Cardiology | Admitting: Cardiology

## 2017-11-15 ENCOUNTER — Encounter (HOSPITAL_COMMUNITY)
Admission: RE | Admit: 2017-11-15 | Discharge: 2017-11-15 | Disposition: A | Discharge status: Home / Self Care | Payer: Self-pay | Source: Ambulatory Visit | Attending: Cardiology | Admitting: Cardiology

## 2017-11-15 DIAGNOSIS — L720 Epidermal cyst: Secondary | ICD-10-CM | POA: Diagnosis not present

## 2017-11-15 DIAGNOSIS — L821 Other seborrheic keratosis: Secondary | ICD-10-CM | POA: Diagnosis not present

## 2017-11-15 DIAGNOSIS — L853 Xerosis cutis: Secondary | ICD-10-CM | POA: Diagnosis not present

## 2017-11-15 DIAGNOSIS — D485 Neoplasm of uncertain behavior of skin: Secondary | ICD-10-CM | POA: Diagnosis not present

## 2017-11-15 DIAGNOSIS — D225 Melanocytic nevi of trunk: Secondary | ICD-10-CM | POA: Diagnosis not present

## 2017-11-15 DIAGNOSIS — L812 Freckles: Secondary | ICD-10-CM | POA: Diagnosis not present

## 2017-11-15 DIAGNOSIS — D2271 Melanocytic nevi of right lower limb, including hip: Secondary | ICD-10-CM | POA: Diagnosis not present

## 2017-11-15 DIAGNOSIS — Z85820 Personal history of malignant melanoma of skin: Secondary | ICD-10-CM | POA: Diagnosis not present

## 2017-11-17 ENCOUNTER — Encounter (HOSPITAL_COMMUNITY)
Admission: RE | Admit: 2017-11-17 | Discharge: 2017-11-17 | Disposition: A | Discharge status: Home / Self Care | Payer: Self-pay | Source: Ambulatory Visit | Attending: Cardiology | Admitting: Cardiology

## 2017-11-19 ENCOUNTER — Encounter (HOSPITAL_COMMUNITY)
Admission: RE | Admit: 2017-11-19 | Discharge: 2017-11-19 | Disposition: A | Discharge status: Home / Self Care | Payer: Self-pay | Source: Ambulatory Visit | Attending: Cardiology | Admitting: Cardiology

## 2017-11-24 ENCOUNTER — Encounter (HOSPITAL_COMMUNITY)
Admission: RE | Admit: 2017-11-24 | Discharge: 2017-11-24 | Disposition: A | Discharge status: Home / Self Care | Payer: Self-pay | Source: Ambulatory Visit | Attending: Cardiology | Admitting: Cardiology

## 2017-11-26 ENCOUNTER — Encounter (HOSPITAL_COMMUNITY)
Admission: RE | Admit: 2017-11-26 | Discharge: 2017-11-26 | Disposition: A | Discharge status: Home / Self Care | Payer: Self-pay | Source: Ambulatory Visit | Attending: Cardiology | Admitting: Cardiology

## 2017-11-29 ENCOUNTER — Encounter (HOSPITAL_COMMUNITY)
Admission: RE | Admit: 2017-11-29 | Discharge: 2017-11-29 | Disposition: A | Discharge status: Home / Self Care | Payer: Self-pay | Source: Ambulatory Visit | Attending: Cardiology | Admitting: Cardiology

## 2017-12-01 ENCOUNTER — Encounter (HOSPITAL_COMMUNITY)
Admission: RE | Admit: 2017-12-01 | Discharge: 2017-12-01 | Disposition: A | Discharge status: Home / Self Care | Payer: Self-pay | Source: Ambulatory Visit | Attending: Cardiology | Admitting: Cardiology

## 2017-12-01 DIAGNOSIS — I2510 Atherosclerotic heart disease of native coronary artery without angina pectoris: Secondary | ICD-10-CM | POA: Insufficient documentation

## 2017-12-03 ENCOUNTER — Encounter (HOSPITAL_COMMUNITY)
Admission: RE | Admit: 2017-12-03 | Discharge: 2017-12-03 | Disposition: A | Discharge status: Home / Self Care | Payer: Self-pay | Source: Ambulatory Visit | Attending: Cardiology | Admitting: Cardiology

## 2017-12-06 ENCOUNTER — Encounter (HOSPITAL_COMMUNITY): Payer: Self-pay

## 2017-12-08 ENCOUNTER — Encounter (HOSPITAL_COMMUNITY)
Admission: RE | Admit: 2017-12-08 | Discharge: 2017-12-08 | Disposition: A | Discharge status: Home / Self Care | Payer: Self-pay | Source: Ambulatory Visit | Attending: Cardiology | Admitting: Cardiology

## 2017-12-10 ENCOUNTER — Encounter (HOSPITAL_COMMUNITY)
Admission: RE | Admit: 2017-12-10 | Discharge: 2017-12-10 | Disposition: A | Discharge status: Home / Self Care | Payer: Medicare Other | Source: Ambulatory Visit | Attending: Cardiology | Admitting: Cardiology

## 2017-12-13 ENCOUNTER — Encounter (HOSPITAL_COMMUNITY): Payer: Self-pay

## 2017-12-15 ENCOUNTER — Encounter (HOSPITAL_COMMUNITY)
Admission: RE | Admit: 2017-12-15 | Discharge: 2017-12-15 | Disposition: A | Discharge status: Home / Self Care | Payer: Self-pay | Source: Ambulatory Visit | Attending: Cardiology | Admitting: Cardiology

## 2017-12-17 ENCOUNTER — Encounter (HOSPITAL_COMMUNITY): Payer: Self-pay

## 2017-12-20 ENCOUNTER — Encounter (HOSPITAL_COMMUNITY)
Admission: RE | Admit: 2017-12-20 | Discharge: 2017-12-20 | Disposition: A | Discharge status: Home / Self Care | Payer: Medicare Other | Source: Ambulatory Visit | Attending: Cardiology | Admitting: Cardiology

## 2017-12-22 ENCOUNTER — Encounter (HOSPITAL_COMMUNITY)
Admission: RE | Admit: 2017-12-22 | Discharge: 2017-12-22 | Disposition: A | Discharge status: Home / Self Care | Payer: Self-pay | Source: Ambulatory Visit | Attending: Cardiology | Admitting: Cardiology

## 2017-12-23 ENCOUNTER — Other Ambulatory Visit: Payer: Self-pay | Admitting: Cardiology

## 2017-12-24 ENCOUNTER — Encounter (HOSPITAL_COMMUNITY)
Admission: RE | Admit: 2017-12-24 | Discharge: 2017-12-24 | Disposition: A | Discharge status: Home / Self Care | Payer: Medicare Other | Source: Ambulatory Visit | Attending: Cardiology | Admitting: Cardiology

## 2017-12-24 IMAGING — CT CT CHEST W/O CM
3 of 4 series · 16 of 30 positions shown, 18 images · non-contrast
Comparison: December 29, 2016 CTA of the chest

CLINICAL DATA: Follow-up right upper lobe nodule.

EXAM:
CT CHEST WITHOUT CONTRAST
TECHNIQUE: Multidetector CT imaging of the chest was performed following the
standard protocol without IV contrast.

[Series 3: chest w/o · axial · non-contrast · 0.70mm/px · z∈[-310,-65]mm · 7 of 132 slices shown, 9 images]
[im 17/132  mediastinal]
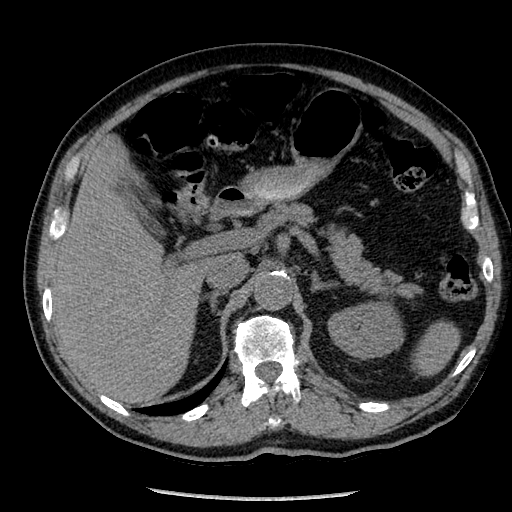
[im 17/132  lung]
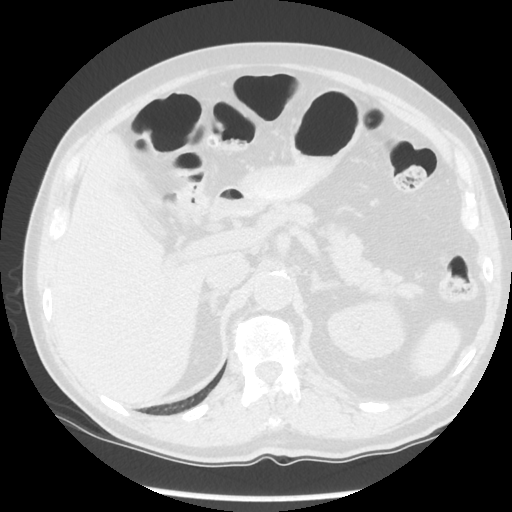
[im 33/132  lung]
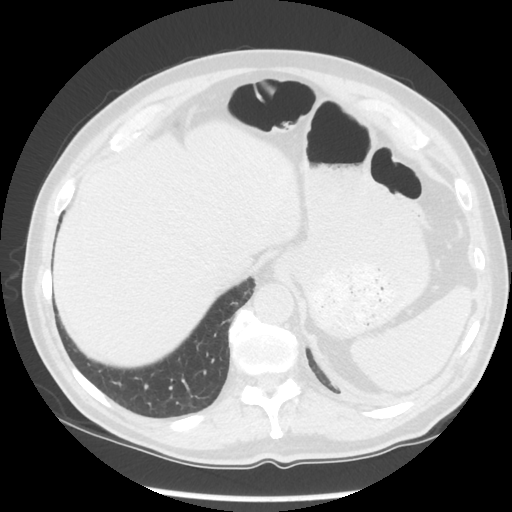
[im 50/132  lung]
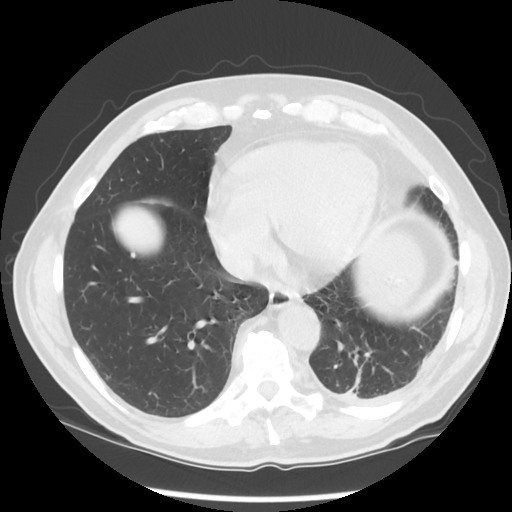
[im 66/132  lung]
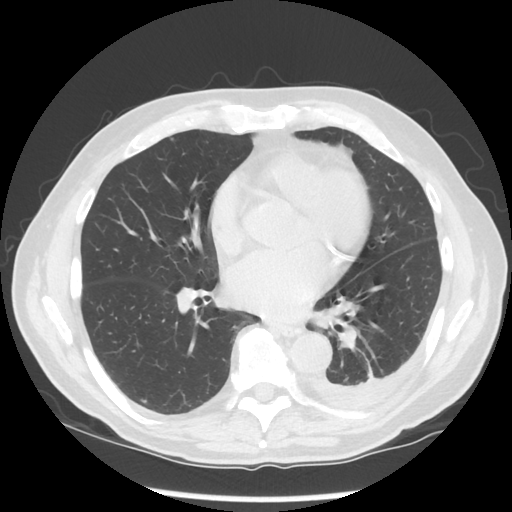
[im 82/132  mediastinal]
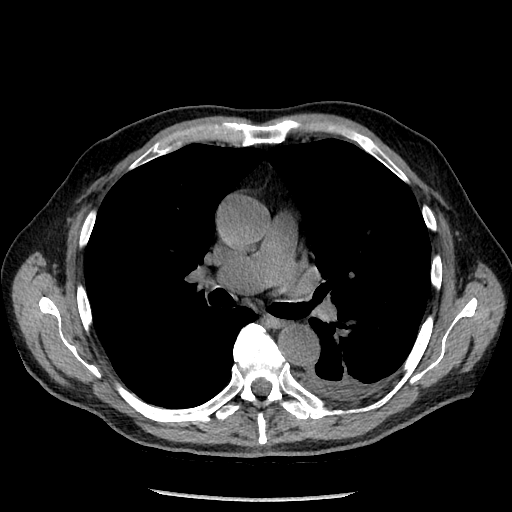
[im 82/132  lung]
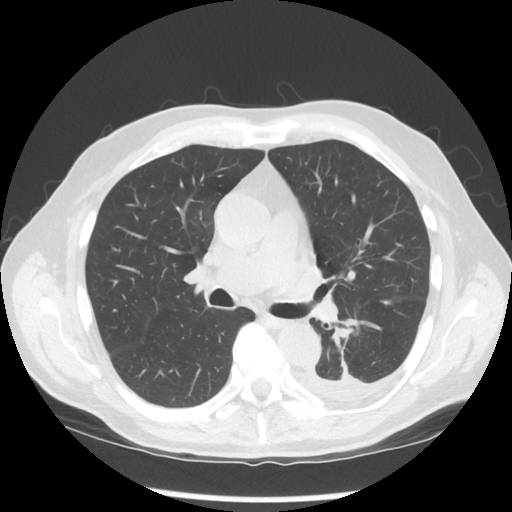
[im 99/132  lung]
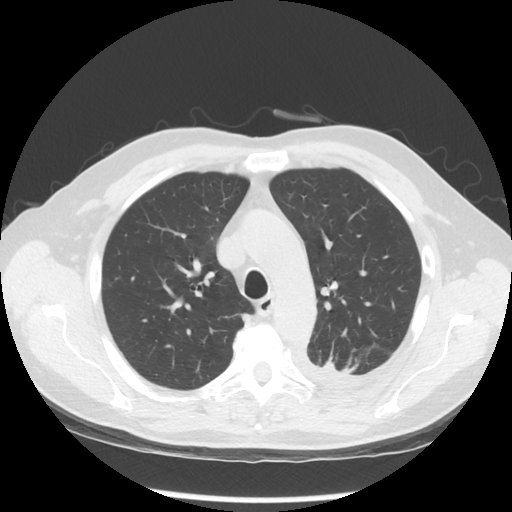
[im 115/132  lung]
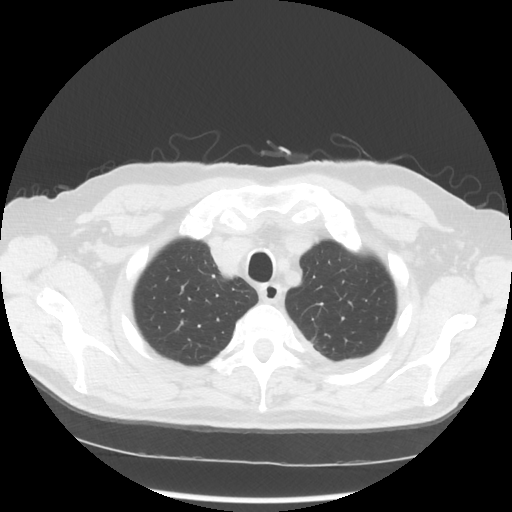

[Series 4: lung windows · axial · 0.70mm/px · z∈[-310,-65]mm · 7 of 132 slices shown]
[im 17/132  lung]
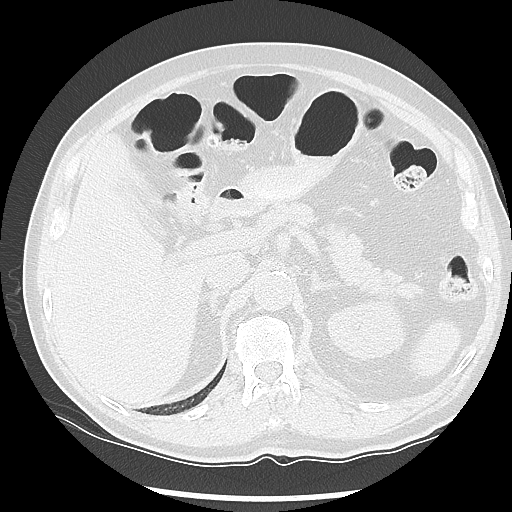
[im 33/132  lung]
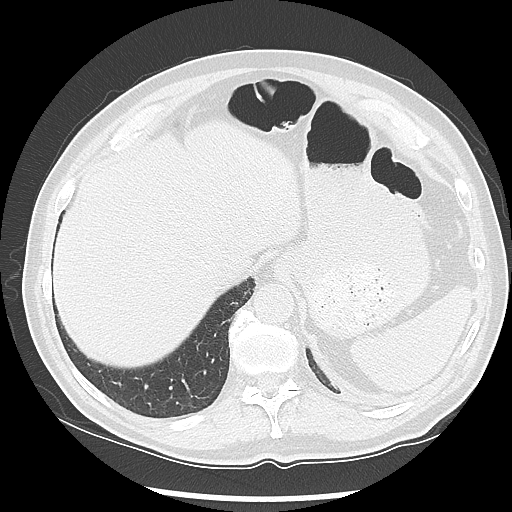
[im 50/132  lung]
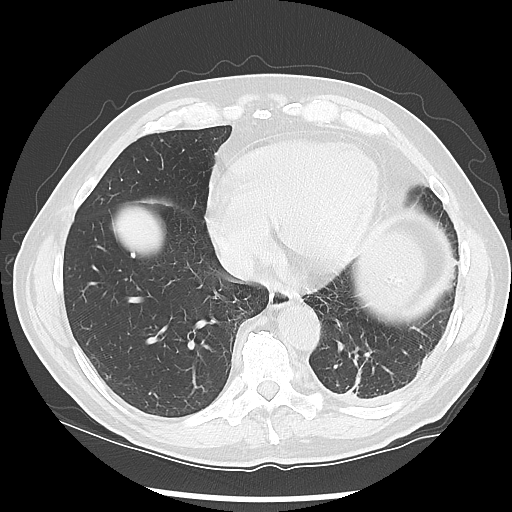
[im 66/132  lung]
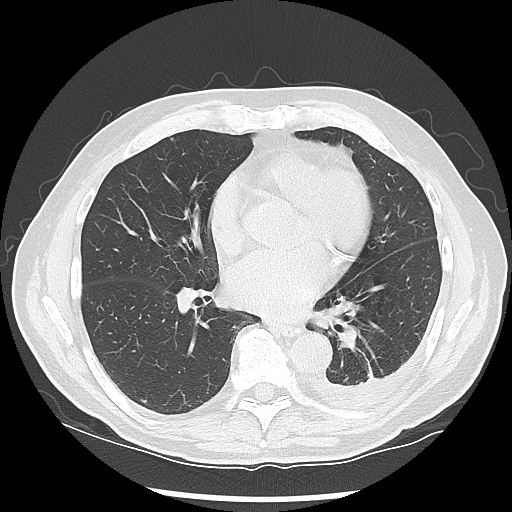
[im 82/132  lung]
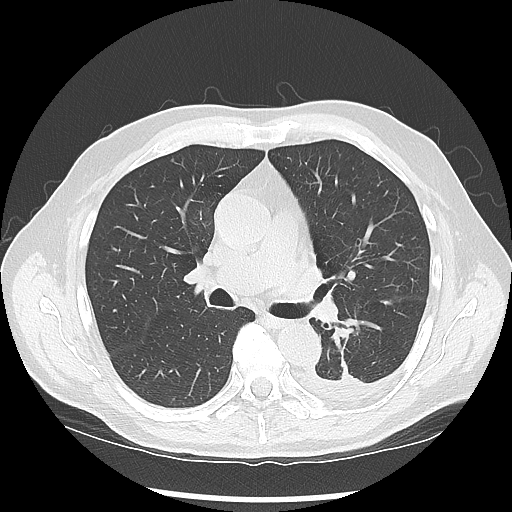
[im 99/132  lung]
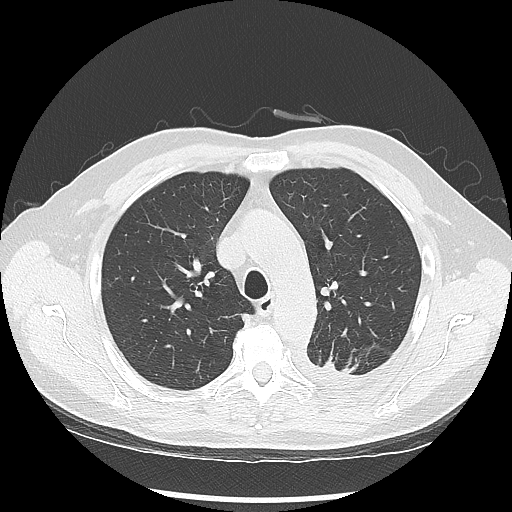
[im 115/132  lung]
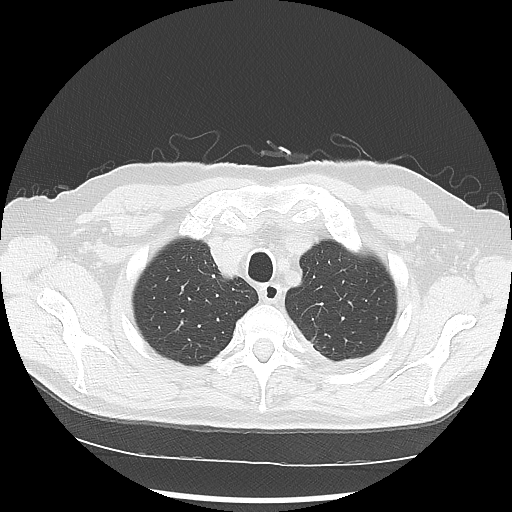

[Series 602: sagittal body · sagittal · 0.70mm/px · 2 of 145 slices shown]
[im 17/145  mediastinal]
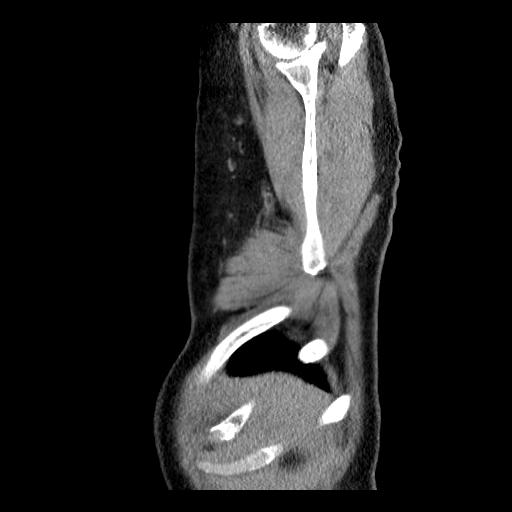
[im 33/145  mediastinal]
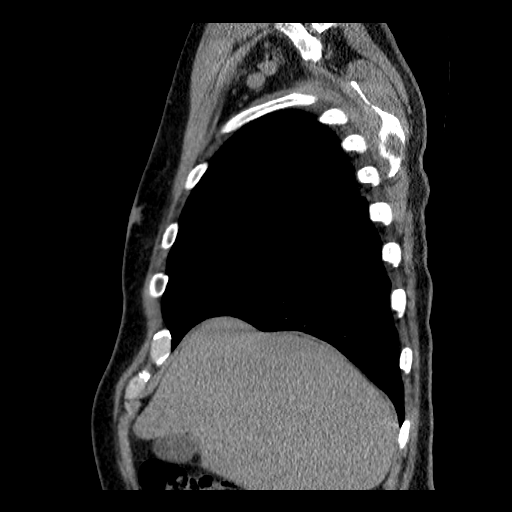

[16 of 30 positions shown; findings below may reference images not displayed]

FINDINGS: Cardiovascular: No thoracic aortic aneurysm. Minimal atherosclerotic
change. Coronary artery calcifications identified. The heart is
unchanged.

Mediastinum/Nodes: No adenopathy. Small left effusion in the pleural
space. No other mediastinal abnormalities identified.

Lungs/Pleura: Central airways are normal. There is a nodule in the
right upper lobe abutting the pleura on axial image 20 and coronal
image 65. Positioning is quite different between today's study and
the previous CTA of the chest. The nodule measures 7 by 4.3 x 5.7 mm
today versus 6 by 5 by 5 mm on the CTA of the chest from December 29, 2016. The small differences in measurement may be due to difference
in slice selection. A nodule in the right upper lobe on series 4,
image 37 is stable. Mild nodularity along the right minor fissure
may be unchanged given difference in slice selection. A tiny nodule
in the right lung on series 4, image 62 is stable. A nodule in the
right base on series 4, image 72 measures 4.7 mm. This was not seen
previously, possibly due to respiratory motion. A few other tiny
nodules were also not as well assessed previously, possibly due to
respiratory motion. A nodule in the left base on image 79 was not
seen previously due to an effusion, measuring 7.6 mm today. Several
other tiny nodules on the left such as on image 73 were also not
well evaluated previously. No masses. There is a small left-sided
pleural effusion with atelectasis. No suspicious infiltrate
identified.

Upper Abdomen: Limited views of the upper abdomen demonstrate no
changes.

Musculoskeletal: No chest wall mass or suspicious bone lesions
identified.
IMPRESSION: 1. There are multiple pulmonary nodules in each lung. The largest in
the right apex is similar in the interval. The slight increased
prominence today may be due to difference in slice selection.
Multiple nodules are clearly stable. Others are seen on today's
study but not the previous study possibly all explained by
respiratory motion and a left-sided effusion previously. Recommend a
follow-up CT scan in 3 months to ensure stability.
2. Atherosclerosis.
3. Coronary artery calcifications.

Aortic Atherosclerosis ([id number].A).

## 2017-12-27 ENCOUNTER — Encounter (HOSPITAL_COMMUNITY)
Admission: RE | Admit: 2017-12-27 | Discharge: 2017-12-27 | Disposition: A | Discharge status: Home / Self Care | Payer: Self-pay | Source: Ambulatory Visit | Attending: Cardiology | Admitting: Cardiology

## 2017-12-29 ENCOUNTER — Encounter (HOSPITAL_COMMUNITY)
Admission: RE | Admit: 2017-12-29 | Discharge: 2017-12-29 | Disposition: A | Discharge status: Home / Self Care | Payer: Self-pay | Source: Ambulatory Visit | Attending: Cardiology | Admitting: Cardiology

## 2017-12-31 ENCOUNTER — Encounter (HOSPITAL_COMMUNITY)
Admission: RE | Admit: 2017-12-31 | Discharge: 2017-12-31 | Disposition: A | Discharge status: Home / Self Care | Payer: Self-pay | Source: Ambulatory Visit | Attending: Cardiology | Admitting: Cardiology

## 2017-12-31 DIAGNOSIS — I2510 Atherosclerotic heart disease of native coronary artery without angina pectoris: Secondary | ICD-10-CM | POA: Insufficient documentation

## 2018-01-03 ENCOUNTER — Encounter (HOSPITAL_COMMUNITY)
Admission: RE | Admit: 2018-01-03 | Discharge: 2018-01-03 | Disposition: A | Discharge status: Home / Self Care | Payer: Self-pay | Source: Ambulatory Visit | Attending: Cardiology | Admitting: Cardiology

## 2018-01-05 ENCOUNTER — Encounter (HOSPITAL_COMMUNITY)
Admission: RE | Admit: 2018-01-05 | Discharge: 2018-01-05 | Disposition: A | Discharge status: Home / Self Care | Payer: Medicare Other | Source: Ambulatory Visit | Attending: Cardiology | Admitting: Cardiology

## 2018-01-07 ENCOUNTER — Encounter (HOSPITAL_COMMUNITY): Payer: Self-pay

## 2018-01-10 ENCOUNTER — Encounter (HOSPITAL_COMMUNITY)
Admission: RE | Admit: 2018-01-10 | Discharge: 2018-01-10 | Disposition: A | Discharge status: Home / Self Care | Payer: Self-pay | Source: Ambulatory Visit | Attending: Cardiology | Admitting: Cardiology

## 2018-01-12 ENCOUNTER — Encounter (HOSPITAL_COMMUNITY)
Admission: RE | Admit: 2018-01-12 | Discharge: 2018-01-12 | Disposition: A | Discharge status: Home / Self Care | Payer: Self-pay | Source: Ambulatory Visit | Attending: Cardiology | Admitting: Cardiology

## 2018-01-14 ENCOUNTER — Encounter (HOSPITAL_COMMUNITY): Payer: Self-pay

## 2018-01-17 ENCOUNTER — Encounter (HOSPITAL_COMMUNITY)
Admission: RE | Admit: 2018-01-17 | Discharge: 2018-01-17 | Disposition: A | Discharge status: Home / Self Care | Payer: Self-pay | Source: Ambulatory Visit | Attending: Cardiology | Admitting: Cardiology

## 2018-01-19 ENCOUNTER — Encounter (HOSPITAL_COMMUNITY)
Admission: RE | Admit: 2018-01-19 | Discharge: 2018-01-19 | Disposition: A | Discharge status: Home / Self Care | Payer: Self-pay | Source: Ambulatory Visit | Attending: Cardiology | Admitting: Cardiology

## 2018-01-21 ENCOUNTER — Encounter (HOSPITAL_COMMUNITY)
Admission: RE | Admit: 2018-01-21 | Discharge: 2018-01-21 | Disposition: A | Discharge status: Home / Self Care | Payer: Self-pay | Source: Ambulatory Visit | Attending: Cardiology | Admitting: Cardiology

## 2018-01-24 ENCOUNTER — Encounter (HOSPITAL_COMMUNITY)
Admission: RE | Admit: 2018-01-24 | Discharge: 2018-01-24 | Disposition: A | Discharge status: Home / Self Care | Payer: Self-pay | Source: Ambulatory Visit | Attending: Cardiology | Admitting: Cardiology

## 2018-01-26 ENCOUNTER — Encounter (HOSPITAL_COMMUNITY)
Admission: RE | Admit: 2018-01-26 | Discharge: 2018-01-26 | Disposition: A | Discharge status: Home / Self Care | Payer: Self-pay | Source: Ambulatory Visit | Attending: Cardiology | Admitting: Cardiology

## 2018-01-28 ENCOUNTER — Encounter (HOSPITAL_COMMUNITY)
Admission: RE | Admit: 2018-01-28 | Discharge: 2018-01-28 | Disposition: A | Discharge status: Home / Self Care | Payer: Medicare Other | Source: Ambulatory Visit | Attending: Cardiology | Admitting: Cardiology

## 2018-01-28 DIAGNOSIS — I2510 Atherosclerotic heart disease of native coronary artery without angina pectoris: Secondary | ICD-10-CM | POA: Diagnosis not present

## 2018-01-31 ENCOUNTER — Encounter (HOSPITAL_COMMUNITY)
Admission: RE | Admit: 2018-01-31 | Discharge: 2018-01-31 | Disposition: A | Discharge status: Home / Self Care | Payer: Medicare Other | Source: Ambulatory Visit | Attending: Cardiology | Admitting: Cardiology

## 2018-01-31 DIAGNOSIS — I2510 Atherosclerotic heart disease of native coronary artery without angina pectoris: Secondary | ICD-10-CM | POA: Diagnosis not present

## 2018-02-02 ENCOUNTER — Encounter (HOSPITAL_COMMUNITY)
Admission: RE | Admit: 2018-02-02 | Discharge: 2018-02-02 | Disposition: A | Discharge status: Home / Self Care | Payer: Medicare Other | Source: Ambulatory Visit | Attending: Cardiology | Admitting: Cardiology

## 2018-02-04 ENCOUNTER — Encounter (HOSPITAL_COMMUNITY): Payer: Medicare Other

## 2018-02-05 ENCOUNTER — Other Ambulatory Visit: Payer: Self-pay | Admitting: Cardiology

## 2018-02-07 ENCOUNTER — Encounter (HOSPITAL_COMMUNITY): Payer: Medicare Other

## 2018-02-09 ENCOUNTER — Encounter (HOSPITAL_COMMUNITY): Payer: Medicare Other

## 2018-02-11 ENCOUNTER — Encounter (HOSPITAL_COMMUNITY)
Admission: RE | Admit: 2018-02-11 | Discharge: 2018-02-11 | Disposition: A | Discharge status: Home / Self Care | Payer: Medicare Other | Source: Ambulatory Visit | Attending: Cardiology | Admitting: Cardiology

## 2018-02-14 ENCOUNTER — Encounter (HOSPITAL_COMMUNITY)
Admission: RE | Admit: 2018-02-14 | Discharge: 2018-02-14 | Disposition: A | Discharge status: Home / Self Care | Payer: Medicare Other | Source: Ambulatory Visit | Attending: Cardiology | Admitting: Cardiology

## 2018-02-14 DIAGNOSIS — L812 Freckles: Secondary | ICD-10-CM | POA: Diagnosis not present

## 2018-02-14 DIAGNOSIS — D225 Melanocytic nevi of trunk: Secondary | ICD-10-CM | POA: Diagnosis not present

## 2018-02-14 DIAGNOSIS — L853 Xerosis cutis: Secondary | ICD-10-CM | POA: Diagnosis not present

## 2018-02-14 DIAGNOSIS — Z85820 Personal history of malignant melanoma of skin: Secondary | ICD-10-CM | POA: Diagnosis not present

## 2018-02-14 DIAGNOSIS — L821 Other seborrheic keratosis: Secondary | ICD-10-CM | POA: Diagnosis not present

## 2018-02-16 ENCOUNTER — Encounter (HOSPITAL_COMMUNITY)
Admission: RE | Admit: 2018-02-16 | Discharge: 2018-02-16 | Disposition: A | Discharge status: Home / Self Care | Payer: Medicare Other | Source: Ambulatory Visit | Attending: Cardiology | Admitting: Cardiology

## 2018-02-18 ENCOUNTER — Encounter (HOSPITAL_COMMUNITY)
Admission: RE | Admit: 2018-02-18 | Discharge: 2018-02-18 | Disposition: A | Discharge status: Home / Self Care | Payer: Medicare Other | Source: Ambulatory Visit | Attending: Cardiology | Admitting: Cardiology

## 2018-02-21 ENCOUNTER — Encounter (HOSPITAL_COMMUNITY): Payer: Medicare Other

## 2018-02-23 ENCOUNTER — Encounter (HOSPITAL_COMMUNITY)
Admission: RE | Admit: 2018-02-23 | Discharge: 2018-02-23 | Disposition: A | Discharge status: Home / Self Care | Payer: Medicare Other | Source: Ambulatory Visit | Attending: Cardiology | Admitting: Cardiology

## 2018-02-25 ENCOUNTER — Encounter (HOSPITAL_COMMUNITY)
Admission: RE | Admit: 2018-02-25 | Discharge: 2018-02-25 | Disposition: A | Discharge status: Home / Self Care | Payer: Medicare Other | Source: Ambulatory Visit | Attending: Cardiology | Admitting: Cardiology

## 2018-02-28 ENCOUNTER — Other Ambulatory Visit: Payer: Self-pay | Admitting: Cardiology

## 2018-02-28 ENCOUNTER — Encounter (HOSPITAL_COMMUNITY)
Admission: RE | Admit: 2018-02-28 | Discharge: 2018-02-28 | Disposition: A | Discharge status: Home / Self Care | Payer: Self-pay | Source: Ambulatory Visit | Attending: Cardiology | Admitting: Cardiology

## 2018-02-28 DIAGNOSIS — I2510 Atherosclerotic heart disease of native coronary artery without angina pectoris: Secondary | ICD-10-CM | POA: Insufficient documentation

## 2018-02-28 NOTE — Telephone Encounter (Signed)
REFILL 

## 2018-03-02 ENCOUNTER — Encounter (HOSPITAL_COMMUNITY)
Admission: RE | Admit: 2018-03-02 | Discharge: 2018-03-02 | Disposition: A | Discharge status: Home / Self Care | Payer: Medicare Other | Source: Ambulatory Visit | Attending: Cardiology | Admitting: Cardiology

## 2018-03-04 ENCOUNTER — Encounter (HOSPITAL_COMMUNITY): Payer: Self-pay

## 2018-03-07 ENCOUNTER — Encounter (HOSPITAL_COMMUNITY)
Admission: RE | Admit: 2018-03-07 | Discharge: 2018-03-07 | Disposition: A | Discharge status: Home / Self Care | Payer: Self-pay | Source: Ambulatory Visit | Attending: Cardiology | Admitting: Cardiology

## 2018-03-09 ENCOUNTER — Encounter (HOSPITAL_COMMUNITY): Payer: Self-pay

## 2018-03-11 ENCOUNTER — Encounter (HOSPITAL_COMMUNITY)
Admission: RE | Admit: 2018-03-11 | Discharge: 2018-03-11 | Disposition: A | Discharge status: Home / Self Care | Payer: Self-pay | Source: Ambulatory Visit | Attending: Cardiology | Admitting: Cardiology

## 2018-03-14 ENCOUNTER — Encounter (HOSPITAL_COMMUNITY)
Admission: RE | Admit: 2018-03-14 | Discharge: 2018-03-14 | Disposition: A | Discharge status: Home / Self Care | Payer: Self-pay | Source: Ambulatory Visit | Attending: Cardiology | Admitting: Cardiology

## 2018-03-16 ENCOUNTER — Encounter (HOSPITAL_COMMUNITY): Payer: Self-pay

## 2018-03-18 ENCOUNTER — Encounter (HOSPITAL_COMMUNITY): Payer: Self-pay

## 2018-03-21 ENCOUNTER — Encounter (HOSPITAL_COMMUNITY)
Admission: RE | Admit: 2018-03-21 | Discharge: 2018-03-21 | Disposition: A | Discharge status: Home / Self Care | Payer: Medicare Other | Source: Ambulatory Visit | Attending: Cardiology | Admitting: Cardiology

## 2018-03-23 ENCOUNTER — Encounter (HOSPITAL_COMMUNITY): Payer: Self-pay

## 2018-03-25 ENCOUNTER — Encounter (HOSPITAL_COMMUNITY): Payer: Self-pay

## 2018-03-28 ENCOUNTER — Encounter (HOSPITAL_COMMUNITY): Payer: Self-pay

## 2018-03-30 ENCOUNTER — Encounter (HOSPITAL_COMMUNITY)
Admission: RE | Admit: 2018-03-30 | Discharge: 2018-03-30 | Disposition: A | Discharge status: Home / Self Care | Payer: Self-pay | Source: Ambulatory Visit | Attending: Cardiology | Admitting: Cardiology

## 2018-03-30 DIAGNOSIS — I2510 Atherosclerotic heart disease of native coronary artery without angina pectoris: Secondary | ICD-10-CM | POA: Insufficient documentation

## 2018-04-01 ENCOUNTER — Encounter (HOSPITAL_COMMUNITY): Payer: Self-pay

## 2018-04-04 ENCOUNTER — Encounter (HOSPITAL_COMMUNITY): Payer: Self-pay

## 2018-04-06 ENCOUNTER — Encounter (HOSPITAL_COMMUNITY): Payer: Self-pay

## 2018-04-08 ENCOUNTER — Encounter (HOSPITAL_COMMUNITY): Payer: Self-pay

## 2018-04-11 ENCOUNTER — Encounter (HOSPITAL_COMMUNITY): Payer: Self-pay

## 2018-04-13 ENCOUNTER — Encounter (HOSPITAL_COMMUNITY): Payer: Self-pay

## 2018-04-15 ENCOUNTER — Encounter (HOSPITAL_COMMUNITY): Payer: Self-pay

## 2018-04-18 ENCOUNTER — Encounter (HOSPITAL_COMMUNITY): Payer: Self-pay

## 2018-04-20 ENCOUNTER — Encounter (HOSPITAL_COMMUNITY)
Admission: RE | Admit: 2018-04-20 | Discharge: 2018-04-20 | Disposition: A | Discharge status: Home / Self Care | Payer: Self-pay | Source: Ambulatory Visit | Attending: Cardiology | Admitting: Cardiology

## 2018-04-22 ENCOUNTER — Encounter (HOSPITAL_COMMUNITY): Payer: Self-pay

## 2018-04-27 ENCOUNTER — Encounter (HOSPITAL_COMMUNITY): Payer: Self-pay

## 2018-04-28 DIAGNOSIS — R82998 Other abnormal findings in urine: Secondary | ICD-10-CM | POA: Diagnosis not present

## 2018-04-28 DIAGNOSIS — Z125 Encounter for screening for malignant neoplasm of prostate: Secondary | ICD-10-CM | POA: Diagnosis not present

## 2018-04-28 DIAGNOSIS — E538 Deficiency of other specified B group vitamins: Secondary | ICD-10-CM | POA: Diagnosis not present

## 2018-04-28 DIAGNOSIS — E7849 Other hyperlipidemia: Secondary | ICD-10-CM | POA: Diagnosis not present

## 2018-04-28 DIAGNOSIS — E038 Other specified hypothyroidism: Secondary | ICD-10-CM | POA: Diagnosis not present

## 2018-04-29 ENCOUNTER — Encounter (HOSPITAL_COMMUNITY)
Admission: RE | Admit: 2018-04-29 | Discharge: 2018-04-29 | Disposition: A | Discharge status: Home / Self Care | Payer: Medicare Other | Source: Ambulatory Visit | Attending: Cardiology | Admitting: Cardiology

## 2018-05-02 ENCOUNTER — Encounter (HOSPITAL_COMMUNITY)
Admission: RE | Admit: 2018-05-02 | Discharge: 2018-05-02 | Disposition: A | Discharge status: Home / Self Care | Payer: Self-pay | Source: Ambulatory Visit | Attending: Cardiology | Admitting: Cardiology

## 2018-05-02 DIAGNOSIS — I2510 Atherosclerotic heart disease of native coronary artery without angina pectoris: Secondary | ICD-10-CM | POA: Insufficient documentation

## 2018-05-03 DIAGNOSIS — Z0000 Encounter for general adult medical examination without abnormal findings: Secondary | ICD-10-CM | POA: Diagnosis not present

## 2018-05-03 DIAGNOSIS — E538 Deficiency of other specified B group vitamins: Secondary | ICD-10-CM | POA: Diagnosis not present

## 2018-05-03 DIAGNOSIS — Z1389 Encounter for screening for other disorder: Secondary | ICD-10-CM | POA: Diagnosis not present

## 2018-05-03 DIAGNOSIS — D692 Other nonthrombocytopenic purpura: Secondary | ICD-10-CM | POA: Diagnosis not present

## 2018-05-03 DIAGNOSIS — I2510 Atherosclerotic heart disease of native coronary artery without angina pectoris: Secondary | ICD-10-CM | POA: Diagnosis not present

## 2018-05-03 DIAGNOSIS — R918 Other nonspecific abnormal finding of lung field: Secondary | ICD-10-CM | POA: Diagnosis not present

## 2018-05-03 DIAGNOSIS — E7849 Other hyperlipidemia: Secondary | ICD-10-CM | POA: Diagnosis not present

## 2018-05-03 DIAGNOSIS — Z6825 Body mass index (BMI) 25.0-25.9, adult: Secondary | ICD-10-CM | POA: Diagnosis not present

## 2018-05-03 DIAGNOSIS — R030 Elevated blood-pressure reading, without diagnosis of hypertension: Secondary | ICD-10-CM | POA: Diagnosis not present

## 2018-05-03 DIAGNOSIS — D539 Nutritional anemia, unspecified: Secondary | ICD-10-CM | POA: Diagnosis not present

## 2018-05-03 DIAGNOSIS — N400 Enlarged prostate without lower urinary tract symptoms: Secondary | ICD-10-CM | POA: Diagnosis not present

## 2018-05-03 DIAGNOSIS — I252 Old myocardial infarction: Secondary | ICD-10-CM | POA: Diagnosis not present

## 2018-05-04 ENCOUNTER — Encounter (HOSPITAL_COMMUNITY): Payer: Self-pay

## 2018-05-06 ENCOUNTER — Encounter (HOSPITAL_COMMUNITY)
Admission: RE | Admit: 2018-05-06 | Discharge: 2018-05-06 | Disposition: A | Discharge status: Home / Self Care | Payer: Self-pay | Source: Ambulatory Visit | Attending: Cardiology | Admitting: Cardiology

## 2018-05-09 ENCOUNTER — Encounter (HOSPITAL_COMMUNITY): Payer: Self-pay

## 2018-05-11 ENCOUNTER — Encounter (HOSPITAL_COMMUNITY)
Admission: RE | Admit: 2018-05-11 | Discharge: 2018-05-11 | Disposition: A | Discharge status: Home / Self Care | Payer: Self-pay | Source: Ambulatory Visit | Attending: Cardiology | Admitting: Cardiology

## 2018-05-13 ENCOUNTER — Encounter (HOSPITAL_COMMUNITY): Payer: Self-pay

## 2018-05-16 ENCOUNTER — Encounter (HOSPITAL_COMMUNITY)
Admission: RE | Admit: 2018-05-16 | Discharge: 2018-05-16 | Disposition: A | Discharge status: Home / Self Care | Payer: Self-pay | Source: Ambulatory Visit | Attending: Cardiology | Admitting: Cardiology

## 2018-05-18 ENCOUNTER — Encounter (HOSPITAL_COMMUNITY): Payer: Self-pay

## 2018-05-20 ENCOUNTER — Encounter (HOSPITAL_COMMUNITY)
Admission: RE | Admit: 2018-05-20 | Discharge: 2018-05-20 | Disposition: A | Discharge status: Home / Self Care | Payer: Self-pay | Source: Ambulatory Visit | Attending: Cardiology | Admitting: Cardiology

## 2018-05-23 ENCOUNTER — Encounter (HOSPITAL_COMMUNITY)
Admission: RE | Admit: 2018-05-23 | Discharge: 2018-05-23 | Disposition: A | Discharge status: Home / Self Care | Payer: Medicare Other | Source: Ambulatory Visit | Attending: Cardiology | Admitting: Cardiology

## 2018-05-25 ENCOUNTER — Encounter (HOSPITAL_COMMUNITY)
Admission: RE | Admit: 2018-05-25 | Discharge: 2018-05-25 | Disposition: A | Discharge status: Home / Self Care | Payer: Medicare Other | Source: Ambulatory Visit | Attending: Cardiology | Admitting: Cardiology

## 2018-05-27 ENCOUNTER — Encounter (HOSPITAL_COMMUNITY)
Admission: RE | Admit: 2018-05-27 | Discharge: 2018-05-27 | Disposition: A | Discharge status: Home / Self Care | Payer: Medicare Other | Source: Ambulatory Visit | Attending: Cardiology | Admitting: Cardiology

## 2018-05-30 ENCOUNTER — Encounter (HOSPITAL_COMMUNITY)
Admission: RE | Admit: 2018-05-30 | Discharge: 2018-05-30 | Disposition: A | Discharge status: Home / Self Care | Payer: Self-pay | Source: Ambulatory Visit | Attending: Cardiology | Admitting: Cardiology

## 2018-05-30 DIAGNOSIS — I2510 Atherosclerotic heart disease of native coronary artery without angina pectoris: Secondary | ICD-10-CM | POA: Insufficient documentation

## 2018-05-30 DIAGNOSIS — E785 Hyperlipidemia, unspecified: Secondary | ICD-10-CM | POA: Insufficient documentation

## 2018-06-01 ENCOUNTER — Encounter (HOSPITAL_COMMUNITY): Payer: Self-pay

## 2018-06-03 ENCOUNTER — Encounter (HOSPITAL_COMMUNITY): Payer: Self-pay

## 2018-06-06 ENCOUNTER — Encounter (HOSPITAL_COMMUNITY)
Admission: RE | Admit: 2018-06-06 | Discharge: 2018-06-06 | Disposition: A | Discharge status: Home / Self Care | Payer: Self-pay | Source: Ambulatory Visit | Attending: Cardiology | Admitting: Cardiology

## 2018-06-06 DIAGNOSIS — Z961 Presence of intraocular lens: Secondary | ICD-10-CM | POA: Diagnosis not present

## 2018-06-06 DIAGNOSIS — H52203 Unspecified astigmatism, bilateral: Secondary | ICD-10-CM | POA: Diagnosis not present

## 2018-06-06 DIAGNOSIS — H02831 Dermatochalasis of right upper eyelid: Secondary | ICD-10-CM | POA: Diagnosis not present

## 2018-06-06 DIAGNOSIS — H04123 Dry eye syndrome of bilateral lacrimal glands: Secondary | ICD-10-CM | POA: Diagnosis not present

## 2018-06-08 ENCOUNTER — Encounter (HOSPITAL_COMMUNITY): Payer: Self-pay

## 2018-06-10 ENCOUNTER — Encounter (HOSPITAL_COMMUNITY): Payer: Self-pay

## 2018-06-13 ENCOUNTER — Encounter (HOSPITAL_COMMUNITY)
Admission: RE | Admit: 2018-06-13 | Discharge: 2018-06-13 | Disposition: A | Discharge status: Home / Self Care | Payer: Self-pay | Source: Ambulatory Visit | Attending: Cardiology | Admitting: Cardiology

## 2018-06-15 ENCOUNTER — Encounter (HOSPITAL_COMMUNITY): Payer: Self-pay

## 2018-06-17 ENCOUNTER — Encounter (HOSPITAL_COMMUNITY)
Admission: RE | Admit: 2018-06-17 | Discharge: 2018-06-17 | Disposition: A | Discharge status: Home / Self Care | Payer: Self-pay | Source: Ambulatory Visit | Attending: Cardiology | Admitting: Cardiology

## 2018-06-20 ENCOUNTER — Encounter (HOSPITAL_COMMUNITY)
Admission: RE | Admit: 2018-06-20 | Discharge: 2018-06-20 | Disposition: A | Discharge status: Home / Self Care | Payer: Self-pay | Source: Ambulatory Visit | Attending: Cardiology | Admitting: Cardiology

## 2018-06-22 ENCOUNTER — Encounter (HOSPITAL_COMMUNITY): Payer: Self-pay

## 2018-06-24 ENCOUNTER — Encounter (HOSPITAL_COMMUNITY)
Admission: RE | Admit: 2018-06-24 | Discharge: 2018-06-24 | Disposition: A | Discharge status: Home / Self Care | Payer: Self-pay | Source: Ambulatory Visit | Attending: Cardiology | Admitting: Cardiology

## 2018-06-27 ENCOUNTER — Encounter (HOSPITAL_COMMUNITY): Payer: Self-pay

## 2018-06-29 ENCOUNTER — Encounter (HOSPITAL_COMMUNITY): Payer: Self-pay

## 2018-07-01 ENCOUNTER — Encounter (HOSPITAL_COMMUNITY): Payer: Self-pay

## 2018-07-01 DIAGNOSIS — I2510 Atherosclerotic heart disease of native coronary artery without angina pectoris: Secondary | ICD-10-CM | POA: Insufficient documentation

## 2018-07-01 DIAGNOSIS — E785 Hyperlipidemia, unspecified: Secondary | ICD-10-CM | POA: Insufficient documentation

## 2018-07-04 ENCOUNTER — Encounter (HOSPITAL_COMMUNITY)
Admission: RE | Admit: 2018-07-04 | Discharge: 2018-07-04 | Disposition: A | Discharge status: Home / Self Care | Payer: Medicare Other | Source: Ambulatory Visit | Attending: Cardiology | Admitting: Cardiology

## 2018-07-06 ENCOUNTER — Encounter (HOSPITAL_COMMUNITY): Payer: Self-pay

## 2018-07-08 ENCOUNTER — Encounter (HOSPITAL_COMMUNITY)
Admission: RE | Admit: 2018-07-08 | Discharge: 2018-07-08 | Disposition: A | Discharge status: Home / Self Care | Payer: Self-pay | Source: Ambulatory Visit | Attending: Cardiology | Admitting: Cardiology

## 2018-07-11 ENCOUNTER — Encounter (HOSPITAL_COMMUNITY)
Admission: RE | Admit: 2018-07-11 | Discharge: 2018-07-11 | Disposition: A | Discharge status: Home / Self Care | Payer: Self-pay | Source: Ambulatory Visit | Attending: Cardiology | Admitting: Cardiology

## 2018-07-13 ENCOUNTER — Encounter (HOSPITAL_COMMUNITY): Payer: Self-pay

## 2018-07-15 ENCOUNTER — Encounter (HOSPITAL_COMMUNITY)
Admission: RE | Admit: 2018-07-15 | Discharge: 2018-07-15 | Disposition: A | Discharge status: Home / Self Care | Payer: Self-pay | Source: Ambulatory Visit | Attending: Cardiology | Admitting: Cardiology

## 2018-07-18 ENCOUNTER — Encounter (HOSPITAL_COMMUNITY)
Admission: RE | Admit: 2018-07-18 | Discharge: 2018-07-18 | Disposition: A | Discharge status: Home / Self Care | Payer: Self-pay | Source: Ambulatory Visit | Attending: Cardiology | Admitting: Cardiology

## 2018-07-20 ENCOUNTER — Encounter (HOSPITAL_COMMUNITY): Payer: Self-pay

## 2018-07-22 ENCOUNTER — Encounter (HOSPITAL_COMMUNITY)
Admission: RE | Admit: 2018-07-22 | Discharge: 2018-07-22 | Disposition: A | Discharge status: Home / Self Care | Payer: Medicare Other | Source: Ambulatory Visit | Attending: Cardiology | Admitting: Cardiology

## 2018-07-25 ENCOUNTER — Encounter (HOSPITAL_COMMUNITY): Payer: Self-pay

## 2018-07-27 ENCOUNTER — Encounter (HOSPITAL_COMMUNITY): Payer: Self-pay

## 2018-07-27 DIAGNOSIS — H9190 Unspecified hearing loss, unspecified ear: Secondary | ICD-10-CM | POA: Insufficient documentation

## 2018-07-27 DIAGNOSIS — H9192 Unspecified hearing loss, left ear: Secondary | ICD-10-CM | POA: Diagnosis not present

## 2018-07-28 DIAGNOSIS — H9312 Tinnitus, left ear: Secondary | ICD-10-CM | POA: Diagnosis not present

## 2018-07-28 DIAGNOSIS — H9122 Sudden idiopathic hearing loss, left ear: Secondary | ICD-10-CM | POA: Diagnosis not present

## 2018-07-28 DIAGNOSIS — H903 Sensorineural hearing loss, bilateral: Secondary | ICD-10-CM | POA: Diagnosis not present

## 2018-07-29 ENCOUNTER — Encounter (HOSPITAL_COMMUNITY): Payer: Self-pay

## 2018-07-29 DIAGNOSIS — H903 Sensorineural hearing loss, bilateral: Secondary | ICD-10-CM | POA: Diagnosis not present

## 2018-07-29 DIAGNOSIS — H9312 Tinnitus, left ear: Secondary | ICD-10-CM | POA: Diagnosis not present

## 2018-07-29 DIAGNOSIS — H905 Unspecified sensorineural hearing loss: Secondary | ICD-10-CM | POA: Diagnosis not present

## 2018-07-29 DIAGNOSIS — H9122 Sudden idiopathic hearing loss, left ear: Secondary | ICD-10-CM | POA: Diagnosis not present

## 2018-08-02 ENCOUNTER — Other Ambulatory Visit: Payer: Self-pay | Admitting: Otolaryngology

## 2018-08-02 DIAGNOSIS — H9122 Sudden idiopathic hearing loss, left ear: Secondary | ICD-10-CM

## 2018-08-03 ENCOUNTER — Encounter (HOSPITAL_COMMUNITY)
Admission: RE | Admit: 2018-08-03 | Discharge: 2018-08-03 | Disposition: A | Discharge status: Home / Self Care | Payer: Medicare Other | Source: Ambulatory Visit | Attending: Cardiology | Admitting: Cardiology

## 2018-08-03 DIAGNOSIS — H9312 Tinnitus, left ear: Secondary | ICD-10-CM | POA: Diagnosis not present

## 2018-08-03 DIAGNOSIS — H903 Sensorineural hearing loss, bilateral: Secondary | ICD-10-CM | POA: Diagnosis not present

## 2018-08-03 DIAGNOSIS — H9122 Sudden idiopathic hearing loss, left ear: Secondary | ICD-10-CM | POA: Diagnosis not present

## 2018-08-03 DIAGNOSIS — E785 Hyperlipidemia, unspecified: Secondary | ICD-10-CM | POA: Insufficient documentation

## 2018-08-03 DIAGNOSIS — I2510 Atherosclerotic heart disease of native coronary artery without angina pectoris: Secondary | ICD-10-CM | POA: Insufficient documentation

## 2018-08-05 ENCOUNTER — Encounter (HOSPITAL_COMMUNITY): Payer: Self-pay

## 2018-08-08 ENCOUNTER — Encounter (HOSPITAL_COMMUNITY)
Admission: RE | Admit: 2018-08-08 | Discharge: 2018-08-08 | Disposition: A | Discharge status: Home / Self Care | Payer: Medicare Other | Source: Ambulatory Visit | Attending: Cardiology | Admitting: Cardiology

## 2018-08-09 ENCOUNTER — Ambulatory Visit
Admission: RE | Admit: 2018-08-09 | Discharge: 2018-08-09 | Disposition: A | Discharge status: Home / Self Care | Payer: Medicare Other | Source: Ambulatory Visit | Attending: Otolaryngology | Admitting: Otolaryngology

## 2018-08-09 DIAGNOSIS — H9122 Sudden idiopathic hearing loss, left ear: Secondary | ICD-10-CM

## 2018-08-09 MED ORDER — GADOBENATE DIMEGLUMINE 529 MG/ML IV SOLN
15.0000 mL | Freq: Once | INTRAVENOUS | Status: AC | PRN
  Administered 2018-08-09: 15 mL via INTRAVENOUS

## 2018-08-10 ENCOUNTER — Encounter (HOSPITAL_COMMUNITY): Payer: Self-pay

## 2018-08-10 DIAGNOSIS — H9122 Sudden idiopathic hearing loss, left ear: Secondary | ICD-10-CM | POA: Diagnosis not present

## 2018-08-10 DIAGNOSIS — H9312 Tinnitus, left ear: Secondary | ICD-10-CM | POA: Diagnosis not present

## 2018-08-10 DIAGNOSIS — H903 Sensorineural hearing loss, bilateral: Secondary | ICD-10-CM | POA: Diagnosis not present

## 2018-08-12 ENCOUNTER — Encounter (HOSPITAL_COMMUNITY): Payer: Self-pay

## 2018-08-15 ENCOUNTER — Encounter (HOSPITAL_COMMUNITY): Payer: Self-pay

## 2018-08-17 ENCOUNTER — Encounter (HOSPITAL_COMMUNITY)
Admission: RE | Admit: 2018-08-17 | Discharge: 2018-08-17 | Disposition: A | Discharge status: Home / Self Care | Payer: Self-pay | Source: Ambulatory Visit | Attending: Cardiology | Admitting: Cardiology

## 2018-08-19 ENCOUNTER — Encounter (HOSPITAL_COMMUNITY)
Admission: RE | Admit: 2018-08-19 | Discharge: 2018-08-19 | Disposition: A | Discharge status: Home / Self Care | Payer: Medicare Other | Source: Ambulatory Visit | Attending: Cardiology | Admitting: Cardiology

## 2018-08-22 ENCOUNTER — Encounter (HOSPITAL_COMMUNITY): Payer: Self-pay

## 2018-08-23 DIAGNOSIS — L812 Freckles: Secondary | ICD-10-CM | POA: Diagnosis not present

## 2018-08-23 DIAGNOSIS — L820 Inflamed seborrheic keratosis: Secondary | ICD-10-CM | POA: Diagnosis not present

## 2018-08-23 DIAGNOSIS — L570 Actinic keratosis: Secondary | ICD-10-CM | POA: Diagnosis not present

## 2018-08-23 DIAGNOSIS — D692 Other nonthrombocytopenic purpura: Secondary | ICD-10-CM | POA: Diagnosis not present

## 2018-08-23 DIAGNOSIS — D225 Melanocytic nevi of trunk: Secondary | ICD-10-CM | POA: Diagnosis not present

## 2018-08-23 DIAGNOSIS — L853 Xerosis cutis: Secondary | ICD-10-CM | POA: Diagnosis not present

## 2018-08-23 DIAGNOSIS — L821 Other seborrheic keratosis: Secondary | ICD-10-CM | POA: Diagnosis not present

## 2018-08-23 DIAGNOSIS — Z85820 Personal history of malignant melanoma of skin: Secondary | ICD-10-CM | POA: Diagnosis not present

## 2018-08-24 ENCOUNTER — Encounter (HOSPITAL_COMMUNITY): Payer: Self-pay

## 2018-08-24 DIAGNOSIS — H9122 Sudden idiopathic hearing loss, left ear: Secondary | ICD-10-CM | POA: Diagnosis not present

## 2018-08-24 DIAGNOSIS — H903 Sensorineural hearing loss, bilateral: Secondary | ICD-10-CM | POA: Diagnosis not present

## 2018-08-24 DIAGNOSIS — H9312 Tinnitus, left ear: Secondary | ICD-10-CM | POA: Diagnosis not present

## 2018-08-26 ENCOUNTER — Encounter (HOSPITAL_COMMUNITY): Payer: Self-pay

## 2018-08-27 DIAGNOSIS — Z23 Encounter for immunization: Secondary | ICD-10-CM | POA: Diagnosis not present

## 2018-08-29 ENCOUNTER — Encounter (HOSPITAL_COMMUNITY): Payer: Self-pay

## 2018-08-31 ENCOUNTER — Encounter (HOSPITAL_COMMUNITY)
Admission: RE | Admit: 2018-08-31 | Discharge: 2018-08-31 | Disposition: A | Discharge status: Home / Self Care | Payer: Self-pay | Source: Ambulatory Visit | Attending: Cardiology | Admitting: Cardiology

## 2018-08-31 DIAGNOSIS — I2510 Atherosclerotic heart disease of native coronary artery without angina pectoris: Secondary | ICD-10-CM | POA: Insufficient documentation

## 2018-08-31 DIAGNOSIS — E785 Hyperlipidemia, unspecified: Secondary | ICD-10-CM | POA: Insufficient documentation

## 2018-09-02 ENCOUNTER — Encounter (HOSPITAL_COMMUNITY)
Admission: RE | Admit: 2018-09-02 | Discharge: 2018-09-02 | Disposition: A | Discharge status: Home / Self Care | Payer: Self-pay | Source: Ambulatory Visit | Attending: Cardiology | Admitting: Cardiology

## 2018-09-05 ENCOUNTER — Encounter (HOSPITAL_COMMUNITY): Payer: Self-pay

## 2018-09-07 ENCOUNTER — Encounter (HOSPITAL_COMMUNITY)
Admission: RE | Admit: 2018-09-07 | Discharge: 2018-09-07 | Disposition: A | Discharge status: Home / Self Care | Payer: Self-pay | Source: Ambulatory Visit | Attending: Cardiology | Admitting: Cardiology

## 2018-09-09 ENCOUNTER — Encounter (HOSPITAL_COMMUNITY): Payer: Self-pay

## 2018-09-12 ENCOUNTER — Encounter (HOSPITAL_COMMUNITY)
Admission: RE | Admit: 2018-09-12 | Discharge: 2018-09-12 | Disposition: A | Discharge status: Home / Self Care | Payer: Medicare Other | Source: Ambulatory Visit | Attending: Cardiology | Admitting: Cardiology

## 2018-09-14 ENCOUNTER — Encounter (HOSPITAL_COMMUNITY)
Admission: RE | Admit: 2018-09-14 | Discharge: 2018-09-14 | Disposition: A | Discharge status: Home / Self Care | Payer: Medicare Other | Source: Ambulatory Visit | Attending: Cardiology | Admitting: Cardiology

## 2018-09-16 ENCOUNTER — Encounter (HOSPITAL_COMMUNITY)
Admission: RE | Admit: 2018-09-16 | Discharge: 2018-09-16 | Disposition: A | Discharge status: Home / Self Care | Payer: Medicare Other | Source: Ambulatory Visit | Attending: Cardiology | Admitting: Cardiology

## 2018-09-19 ENCOUNTER — Encounter (HOSPITAL_COMMUNITY)
Admission: RE | Admit: 2018-09-19 | Discharge: 2018-09-19 | Disposition: A | Discharge status: Home / Self Care | Payer: Self-pay | Source: Ambulatory Visit | Attending: Cardiology | Admitting: Cardiology

## 2018-09-21 ENCOUNTER — Encounter (HOSPITAL_COMMUNITY): Payer: Self-pay

## 2018-09-23 ENCOUNTER — Encounter (HOSPITAL_COMMUNITY): Payer: Self-pay

## 2018-09-26 ENCOUNTER — Encounter (HOSPITAL_COMMUNITY)
Admission: RE | Admit: 2018-09-26 | Discharge: 2018-09-26 | Disposition: A | Discharge status: Home / Self Care | Payer: Self-pay | Source: Ambulatory Visit | Attending: Cardiology | Admitting: Cardiology

## 2018-09-28 ENCOUNTER — Encounter (HOSPITAL_COMMUNITY)
Admission: RE | Admit: 2018-09-28 | Discharge: 2018-09-28 | Disposition: A | Discharge status: Home / Self Care | Payer: Self-pay | Source: Ambulatory Visit | Attending: Cardiology | Admitting: Cardiology

## 2018-09-29 DIAGNOSIS — H9122 Sudden idiopathic hearing loss, left ear: Secondary | ICD-10-CM | POA: Diagnosis not present

## 2018-09-29 DIAGNOSIS — H903 Sensorineural hearing loss, bilateral: Secondary | ICD-10-CM | POA: Diagnosis not present

## 2018-09-29 DIAGNOSIS — H9312 Tinnitus, left ear: Secondary | ICD-10-CM | POA: Diagnosis not present

## 2018-09-30 ENCOUNTER — Encounter (HOSPITAL_COMMUNITY)
Admission: RE | Admit: 2018-09-30 | Discharge: 2018-09-30 | Disposition: A | Discharge status: Home / Self Care | Payer: Self-pay | Source: Ambulatory Visit | Attending: Cardiology | Admitting: Cardiology

## 2018-09-30 DIAGNOSIS — I2510 Atherosclerotic heart disease of native coronary artery without angina pectoris: Secondary | ICD-10-CM | POA: Insufficient documentation

## 2018-09-30 DIAGNOSIS — E785 Hyperlipidemia, unspecified: Secondary | ICD-10-CM | POA: Insufficient documentation

## 2018-10-03 ENCOUNTER — Encounter (HOSPITAL_COMMUNITY): Payer: Self-pay

## 2018-10-05 ENCOUNTER — Encounter (HOSPITAL_COMMUNITY)
Admission: RE | Admit: 2018-10-05 | Discharge: 2018-10-05 | Disposition: A | Discharge status: Home / Self Care | Payer: Self-pay | Source: Ambulatory Visit | Attending: Cardiology | Admitting: Cardiology

## 2018-10-07 ENCOUNTER — Encounter (HOSPITAL_COMMUNITY)
Admission: RE | Admit: 2018-10-07 | Discharge: 2018-10-07 | Disposition: A | Discharge status: Home / Self Care | Payer: Medicare Other | Source: Ambulatory Visit | Attending: Cardiology | Admitting: Cardiology

## 2018-10-10 ENCOUNTER — Encounter (HOSPITAL_COMMUNITY): Payer: Self-pay

## 2018-10-11 ENCOUNTER — Other Ambulatory Visit: Payer: Self-pay | Admitting: Cardiology

## 2018-10-12 ENCOUNTER — Encounter (HOSPITAL_COMMUNITY)
Admission: RE | Admit: 2018-10-12 | Discharge: 2018-10-12 | Disposition: A | Discharge status: Home / Self Care | Payer: Self-pay | Source: Ambulatory Visit | Attending: Cardiology | Admitting: Cardiology

## 2018-10-14 ENCOUNTER — Encounter (HOSPITAL_COMMUNITY): Payer: Self-pay

## 2018-10-17 ENCOUNTER — Encounter (HOSPITAL_COMMUNITY)
Admission: RE | Admit: 2018-10-17 | Discharge: 2018-10-17 | Disposition: A | Discharge status: Home / Self Care | Payer: Medicare Other | Source: Ambulatory Visit | Attending: Cardiology | Admitting: Cardiology

## 2018-10-18 NOTE — Progress Notes (Signed)
Joseph Roman Date of Birth: [birth year] Medical Record #[id number]  History of Present Illness: Mr. Luginbill is seen for yearly followup of CAD. He has a history of coronary disease and is status post inferior lateral myocardial infarction in June of 2009. He had stenting of the mid left circumflex coronary emergently with a 2.75 x 23 mm Promus stent. His last stress test in July of 2015 was without ischemia at excellent exercise level. He is s/p VATS and drainage of a left pleural effusion by Dr. Cyndia Bent in February 2018 for empyema with PNA.   On followup today he is doing very well. He is still working. Goes to Cardiac Rehab on a regular basis. He denies any chest pain, SOB, palpitations, or dizziness. No edema.   Current Outpatient Medications on File Prior to Visit  Medication Sig Dispense Refill  . aspirin 81 MG tablet Take 81 mg by mouth daily.    Marland Kitchen levothyroxine (SYNTHROID, LEVOTHROID) 112 MCG tablet Take 112 mcg by mouth daily.    . nitroGLYCERIN (NITROSTAT) 0.4 MG SL tablet Place 1 tablet (0.4 mg total) under the tongue as needed. 25 tablet 3  . pravastatin (PRAVACHOL) 10 MG tablet TAKE ONE TABLET BY MOUTH DAILY 90 tablet 1  . vitamin B-12 (CYANOCOBALAMIN) 1000 MCG tablet Take 1,000 mcg by mouth daily.     No current facility-administered medications on file prior to visit.     No Known Allergies  Past Medical History:  Diagnosis Date  . Acute inferolateral myocardial infarction ([place]) 04/2008   2.75x23 Promus Lcx  . Arthritis    "comes and goes; hits everywhere" (12/29/2016)  . Coronary artery disease   . Dyslipidemia    Mild dyslipidemia  . GERD (gastroesophageal reflux disease)   . Hypothyroidism   . Pneumonia 12/29/2016    Past Surgical History:  Procedure Laterality Date  . CATARACT EXTRACTION W/ INTRAOCULAR LENS  IMPLANT, BILATERAL Bilateral 2010-2016   "right-left"  . COLONOSCOPY W/ BIOPSIES AND POLYPECTOMY  04/21/2005  . CORONARY ANGIOPLASTY WITH STENT  PLACEMENT  05/07/2008   Ejection fraction was estimated at 60%  . PLEURAL EFFUSION DRAINAGE Left 12/31/2016   Procedure: DRAINAGE OF PLEURAL EFFUSION/empyema;  Surgeon: Gaye Pollack, MD;  Location: [place];  Service: Thoracic;  Laterality: Left;  . TONSILLECTOMY    . VIDEO ASSISTED THORACOSCOPY Left 12/31/2016   Procedure: VIDEO ASSISTED THORACOSCOPY;  Surgeon: Gaye Pollack, MD;  Location: [facility] OR;  Service: Thoracic;  Laterality: Left;    Social History   Tobacco Use  Smoking Status Never Smoker  Smokeless Tobacco Never Used    Social History   Substance and Sexual Activity  Alcohol Use Yes  . Alcohol/week: 1.0 standard drinks  . Types: 1 Cans of beer per week    Family History  Problem Relation Age of Onset  . Cancer - Lung Father     Review of Systems: As noted in history of present illness.  All other systems were reviewed and are negative.  Physical Exam: BP 136/64   Pulse (!) 53   Ht 5\' 10"  (1.778 m)   Wt 176 lb 12.8 oz (80.2 kg)   BMI 25.37 kg/m  GENERAL:  Well appearing WM in NAD HEENT:  PERRL, EOMI, sclera are clear. Oropharynx is clear. NECK:  No jugular venous distention, carotid upstroke brisk and symmetric, no bruits, no thyromegaly or adenopathy LUNGS:  Clear to auscultation bilaterally CHEST:  Unremarkable HEART:  RRR,  PMI not displaced or sustained,S1 and S2  within normal limits, no S3, no S4: no clicks, no rubs, no murmurs ABD:  Soft, nontender. BS +, no masses or bruits. No hepatomegaly, no splenomegaly EXT:  2 + pulses throughout, no edema, no cyanosis no clubbing SKIN:  Warm and dry.  No rashes NEURO:  Alert and oriented x 3. Cranial nerves II through XII intact. PSYCH:  Cognitively intact     LABORATORY DATA: ECG today shows sinus bradycardia with a rate of 53 beats per minute. It is otherwise normal. I have personally reviewed and interpreted this study.  Labs reviewed from Dr. Virgina Jock 04/12/15: Normal CBC, chemistry panel, TSH and PSA.  Cholesterol- 117, trig-53, HDL-40, LDL 66. Dated 04/22/17: cholesterol 115, triglycerides 41, HDL 38, LDL 69. Hgb, CMET and TSH normal. Dated 04/28/18: cholesterol 115, triglycerides 66, HDL 41, LDL 61. CBC, CMET, TSH all normal.    Assessment / Plan: 1. Coronary disease with remote myocardial infarction status post stenting of the left circumflex coronary in 2009 with a second generation of DES. GXT negative in July 2015. Continue ASA and statin. He may stop taking Plavix. He remains asymptomatic.  2. Dyslipidemia-well controlled on statin therapy. At target.

## 2018-10-19 ENCOUNTER — Encounter (HOSPITAL_COMMUNITY)
Admission: RE | Admit: 2018-10-19 | Discharge: 2018-10-19 | Disposition: A | Discharge status: Home / Self Care | Payer: Self-pay | Source: Ambulatory Visit | Attending: Cardiology | Admitting: Cardiology

## 2018-10-20 ENCOUNTER — Ambulatory Visit (INDEPENDENT_AMBULATORY_CARE_PROVIDER_SITE_OTHER): Payer: Medicare Other | Admitting: Cardiology

## 2018-10-20 ENCOUNTER — Encounter: Payer: Self-pay | Admitting: Cardiology

## 2018-10-20 VITALS — BP 136/64 | HR 53 | Ht 70.0 in | Wt 176.8 lb

## 2018-10-20 DIAGNOSIS — I2510 Atherosclerotic heart disease of native coronary artery without angina pectoris: Secondary | ICD-10-CM | POA: Diagnosis not present

## 2018-10-20 NOTE — Patient Instructions (Signed)
You can stop Plavix

## 2018-10-21 ENCOUNTER — Encounter (HOSPITAL_COMMUNITY)
Admission: RE | Admit: 2018-10-21 | Discharge: 2018-10-21 | Disposition: A | Discharge status: Home / Self Care | Payer: Self-pay | Source: Ambulatory Visit | Attending: Cardiology | Admitting: Cardiology

## 2018-10-24 ENCOUNTER — Encounter (HOSPITAL_COMMUNITY): Payer: Self-pay

## 2018-10-26 ENCOUNTER — Encounter (HOSPITAL_COMMUNITY): Payer: Self-pay

## 2018-10-31 ENCOUNTER — Encounter (HOSPITAL_COMMUNITY): Payer: Self-pay

## 2018-10-31 DIAGNOSIS — E785 Hyperlipidemia, unspecified: Secondary | ICD-10-CM | POA: Insufficient documentation

## 2018-10-31 DIAGNOSIS — I2510 Atherosclerotic heart disease of native coronary artery without angina pectoris: Secondary | ICD-10-CM | POA: Insufficient documentation

## 2018-11-02 ENCOUNTER — Encounter (HOSPITAL_COMMUNITY)
Admission: RE | Admit: 2018-11-02 | Discharge: 2018-11-02 | Disposition: A | Discharge status: Home / Self Care | Payer: Self-pay | Source: Ambulatory Visit | Attending: Cardiology | Admitting: Cardiology

## 2018-11-04 ENCOUNTER — Encounter (HOSPITAL_COMMUNITY): Payer: Self-pay

## 2018-11-07 ENCOUNTER — Encounter (HOSPITAL_COMMUNITY)
Admission: RE | Admit: 2018-11-07 | Discharge: 2018-11-07 | Disposition: A | Discharge status: Home / Self Care | Payer: Medicare Other | Source: Ambulatory Visit | Attending: Cardiology | Admitting: Cardiology

## 2018-11-09 ENCOUNTER — Encounter (HOSPITAL_COMMUNITY)
Admission: RE | Admit: 2018-11-09 | Discharge: 2018-11-09 | Disposition: A | Discharge status: Home / Self Care | Payer: Self-pay | Source: Ambulatory Visit | Attending: Cardiology | Admitting: Cardiology

## 2018-11-10 DIAGNOSIS — H9122 Sudden idiopathic hearing loss, left ear: Secondary | ICD-10-CM | POA: Diagnosis not present

## 2018-11-10 DIAGNOSIS — H9312 Tinnitus, left ear: Secondary | ICD-10-CM | POA: Diagnosis not present

## 2018-11-10 DIAGNOSIS — H903 Sensorineural hearing loss, bilateral: Secondary | ICD-10-CM | POA: Diagnosis not present

## 2018-11-11 ENCOUNTER — Encounter (HOSPITAL_COMMUNITY): Payer: Self-pay

## 2018-11-14 ENCOUNTER — Encounter (HOSPITAL_COMMUNITY)
Admission: RE | Admit: 2018-11-14 | Discharge: 2018-11-14 | Disposition: A | Discharge status: Home / Self Care | Payer: Self-pay | Source: Ambulatory Visit | Attending: Cardiology | Admitting: Cardiology

## 2018-11-15 DIAGNOSIS — M65261 Calcific tendinitis, right lower leg: Secondary | ICD-10-CM | POA: Diagnosis not present

## 2018-11-15 DIAGNOSIS — M1711 Unilateral primary osteoarthritis, right knee: Secondary | ICD-10-CM | POA: Diagnosis not present

## 2018-11-16 ENCOUNTER — Encounter (HOSPITAL_COMMUNITY): Payer: Self-pay

## 2018-11-16 ENCOUNTER — Other Ambulatory Visit: Payer: Self-pay | Admitting: Orthopedic Surgery

## 2018-11-16 DIAGNOSIS — R531 Weakness: Secondary | ICD-10-CM

## 2018-11-16 DIAGNOSIS — M25561 Pain in right knee: Secondary | ICD-10-CM

## 2018-11-18 ENCOUNTER — Encounter (HOSPITAL_COMMUNITY)
Admission: RE | Admit: 2018-11-18 | Discharge: 2018-11-18 | Disposition: A | Discharge status: Home / Self Care | Payer: Self-pay | Source: Ambulatory Visit | Attending: Cardiology | Admitting: Cardiology

## 2018-11-21 ENCOUNTER — Encounter (HOSPITAL_COMMUNITY)
Admission: RE | Admit: 2018-11-21 | Discharge: 2018-11-21 | Disposition: A | Discharge status: Home / Self Care | Payer: Self-pay | Source: Ambulatory Visit | Attending: Cardiology | Admitting: Cardiology

## 2018-11-25 ENCOUNTER — Encounter (HOSPITAL_COMMUNITY): Payer: Self-pay

## 2018-11-28 ENCOUNTER — Ambulatory Visit
Admission: RE | Admit: 2018-11-28 | Discharge: 2018-11-28 | Disposition: A | Discharge status: Home / Self Care | Payer: Medicare Other | Source: Ambulatory Visit | Attending: Orthopedic Surgery | Admitting: Orthopedic Surgery

## 2018-11-28 ENCOUNTER — Encounter (HOSPITAL_COMMUNITY)
Admission: RE | Admit: 2018-11-28 | Discharge: 2018-11-28 | Disposition: A | Discharge status: Home / Self Care | Payer: Self-pay | Source: Ambulatory Visit | Attending: Cardiology | Admitting: Cardiology

## 2018-11-28 DIAGNOSIS — R531 Weakness: Secondary | ICD-10-CM

## 2018-11-28 DIAGNOSIS — M25561 Pain in right knee: Secondary | ICD-10-CM

## 2018-11-28 DIAGNOSIS — M23351 Other meniscus derangements, posterior horn of lateral meniscus, right knee: Secondary | ICD-10-CM | POA: Diagnosis not present

## 2018-12-02 ENCOUNTER — Encounter (HOSPITAL_COMMUNITY): Payer: Self-pay

## 2018-12-02 DIAGNOSIS — I2510 Atherosclerotic heart disease of native coronary artery without angina pectoris: Secondary | ICD-10-CM | POA: Insufficient documentation

## 2018-12-02 DIAGNOSIS — S83281A Other tear of lateral meniscus, current injury, right knee, initial encounter: Secondary | ICD-10-CM | POA: Diagnosis not present

## 2018-12-02 DIAGNOSIS — M25561 Pain in right knee: Secondary | ICD-10-CM | POA: Insufficient documentation

## 2018-12-05 ENCOUNTER — Encounter (HOSPITAL_COMMUNITY): Payer: Self-pay

## 2018-12-05 ENCOUNTER — Other Ambulatory Visit: Payer: Medicare Other

## 2018-12-07 ENCOUNTER — Encounter (HOSPITAL_COMMUNITY): Payer: Self-pay

## 2018-12-09 ENCOUNTER — Encounter (HOSPITAL_COMMUNITY)
Admission: RE | Admit: 2018-12-09 | Discharge: 2018-12-09 | Disposition: A | Discharge status: Home / Self Care | Payer: Self-pay | Source: Ambulatory Visit | Attending: Cardiology | Admitting: Cardiology

## 2018-12-12 ENCOUNTER — Encounter (HOSPITAL_COMMUNITY): Payer: Self-pay

## 2018-12-14 ENCOUNTER — Encounter (HOSPITAL_COMMUNITY): Payer: Self-pay

## 2018-12-14 DIAGNOSIS — Y999 Unspecified external cause status: Secondary | ICD-10-CM | POA: Diagnosis not present

## 2018-12-14 DIAGNOSIS — M659 Synovitis and tenosynovitis, unspecified: Secondary | ICD-10-CM | POA: Diagnosis not present

## 2018-12-14 DIAGNOSIS — G8918 Other acute postprocedural pain: Secondary | ICD-10-CM | POA: Diagnosis not present

## 2018-12-14 DIAGNOSIS — X58XXXA Exposure to other specified factors, initial encounter: Secondary | ICD-10-CM | POA: Diagnosis not present

## 2018-12-14 DIAGNOSIS — M94261 Chondromalacia, right knee: Secondary | ICD-10-CM | POA: Diagnosis not present

## 2018-12-14 DIAGNOSIS — S83261A Peripheral tear of lateral meniscus, current injury, right knee, initial encounter: Secondary | ICD-10-CM | POA: Diagnosis not present

## 2018-12-14 DIAGNOSIS — S83271A Complex tear of lateral meniscus, current injury, right knee, initial encounter: Secondary | ICD-10-CM | POA: Diagnosis not present

## 2018-12-14 DIAGNOSIS — M6751 Plica syndrome, right knee: Secondary | ICD-10-CM | POA: Diagnosis not present

## 2018-12-16 ENCOUNTER — Encounter (HOSPITAL_COMMUNITY): Payer: Self-pay

## 2018-12-19 ENCOUNTER — Encounter (HOSPITAL_COMMUNITY): Payer: Self-pay

## 2018-12-20 DIAGNOSIS — Z7409 Other reduced mobility: Secondary | ICD-10-CM | POA: Diagnosis not present

## 2018-12-20 DIAGNOSIS — Z9889 Other specified postprocedural states: Secondary | ICD-10-CM | POA: Insufficient documentation

## 2018-12-20 DIAGNOSIS — M25661 Stiffness of right knee, not elsewhere classified: Secondary | ICD-10-CM | POA: Diagnosis not present

## 2018-12-20 DIAGNOSIS — R262 Difficulty in walking, not elsewhere classified: Secondary | ICD-10-CM | POA: Diagnosis not present

## 2018-12-20 DIAGNOSIS — S83271A Complex tear of lateral meniscus, current injury, right knee, initial encounter: Secondary | ICD-10-CM | POA: Diagnosis not present

## 2018-12-21 ENCOUNTER — Encounter (HOSPITAL_COMMUNITY): Payer: Self-pay

## 2018-12-23 ENCOUNTER — Encounter (HOSPITAL_COMMUNITY): Payer: Self-pay

## 2018-12-26 ENCOUNTER — Encounter (HOSPITAL_COMMUNITY): Payer: Self-pay

## 2018-12-28 ENCOUNTER — Encounter (HOSPITAL_COMMUNITY): Payer: Self-pay

## 2018-12-29 DIAGNOSIS — Z7409 Other reduced mobility: Secondary | ICD-10-CM | POA: Diagnosis not present

## 2018-12-29 DIAGNOSIS — M25661 Stiffness of right knee, not elsewhere classified: Secondary | ICD-10-CM | POA: Diagnosis not present

## 2018-12-29 DIAGNOSIS — S83271A Complex tear of lateral meniscus, current injury, right knee, initial encounter: Secondary | ICD-10-CM | POA: Diagnosis not present

## 2018-12-29 DIAGNOSIS — R262 Difficulty in walking, not elsewhere classified: Secondary | ICD-10-CM | POA: Diagnosis not present

## 2018-12-30 ENCOUNTER — Encounter (HOSPITAL_COMMUNITY)
Admission: RE | Admit: 2018-12-30 | Discharge: 2018-12-30 | Disposition: A | Discharge status: Home / Self Care | Payer: Self-pay | Source: Ambulatory Visit | Attending: Cardiology | Admitting: Cardiology

## 2019-01-02 ENCOUNTER — Encounter (HOSPITAL_COMMUNITY)
Admission: RE | Admit: 2019-01-02 | Discharge: 2019-01-02 | Disposition: A | Discharge status: Home / Self Care | Payer: Self-pay | Source: Ambulatory Visit | Attending: Cardiology | Admitting: Cardiology

## 2019-01-02 DIAGNOSIS — I2510 Atherosclerotic heart disease of native coronary artery without angina pectoris: Secondary | ICD-10-CM | POA: Insufficient documentation

## 2019-01-03 DIAGNOSIS — R262 Difficulty in walking, not elsewhere classified: Secondary | ICD-10-CM | POA: Diagnosis not present

## 2019-01-03 DIAGNOSIS — Z7409 Other reduced mobility: Secondary | ICD-10-CM | POA: Diagnosis not present

## 2019-01-03 DIAGNOSIS — M25661 Stiffness of right knee, not elsewhere classified: Secondary | ICD-10-CM | POA: Diagnosis not present

## 2019-01-03 DIAGNOSIS — S83271A Complex tear of lateral meniscus, current injury, right knee, initial encounter: Secondary | ICD-10-CM | POA: Diagnosis not present

## 2019-01-04 ENCOUNTER — Encounter (HOSPITAL_COMMUNITY)
Admission: RE | Admit: 2019-01-04 | Discharge: 2019-01-04 | Disposition: A | Discharge status: Home / Self Care | Payer: Self-pay | Source: Ambulatory Visit | Attending: Cardiology | Admitting: Cardiology

## 2019-01-06 ENCOUNTER — Encounter (HOSPITAL_COMMUNITY): Payer: Self-pay

## 2019-01-09 ENCOUNTER — Encounter (HOSPITAL_COMMUNITY): Payer: Self-pay

## 2019-01-09 ENCOUNTER — Other Ambulatory Visit: Payer: Self-pay | Admitting: Cardiology

## 2019-01-11 ENCOUNTER — Encounter (HOSPITAL_COMMUNITY)
Admission: RE | Admit: 2019-01-11 | Discharge: 2019-01-11 | Disposition: A | Discharge status: Home / Self Care | Payer: Self-pay | Source: Ambulatory Visit | Attending: Cardiology | Admitting: Cardiology

## 2019-01-13 ENCOUNTER — Encounter (HOSPITAL_COMMUNITY)
Admission: RE | Admit: 2019-01-13 | Discharge: 2019-01-13 | Disposition: A | Discharge status: Home / Self Care | Payer: Self-pay | Source: Ambulatory Visit | Attending: Cardiology | Admitting: Cardiology

## 2019-01-16 ENCOUNTER — Encounter (HOSPITAL_COMMUNITY)
Admission: RE | Admit: 2019-01-16 | Discharge: 2019-01-16 | Disposition: A | Discharge status: Home / Self Care | Payer: Self-pay | Source: Ambulatory Visit | Attending: Cardiology | Admitting: Cardiology

## 2019-01-18 ENCOUNTER — Encounter (HOSPITAL_COMMUNITY)
Admission: RE | Admit: 2019-01-18 | Discharge: 2019-01-18 | Disposition: A | Discharge status: Home / Self Care | Payer: Self-pay | Source: Ambulatory Visit | Attending: Cardiology | Admitting: Cardiology

## 2019-01-20 ENCOUNTER — Encounter (HOSPITAL_COMMUNITY): Payer: Self-pay

## 2019-01-23 ENCOUNTER — Encounter (HOSPITAL_COMMUNITY): Payer: Self-pay

## 2019-01-25 ENCOUNTER — Encounter (HOSPITAL_COMMUNITY): Payer: Self-pay

## 2019-01-27 ENCOUNTER — Encounter (HOSPITAL_COMMUNITY)
Admission: RE | Admit: 2019-01-27 | Discharge: 2019-01-27 | Disposition: A | Discharge status: Home / Self Care | Payer: Self-pay | Source: Ambulatory Visit | Attending: Cardiology | Admitting: Cardiology

## 2019-01-30 ENCOUNTER — Encounter (HOSPITAL_COMMUNITY)
Admission: RE | Admit: 2019-01-30 | Discharge: 2019-01-30 | Disposition: A | Discharge status: Home / Self Care | Payer: Self-pay | Source: Ambulatory Visit | Attending: Cardiology | Admitting: Cardiology

## 2019-01-30 DIAGNOSIS — I2510 Atherosclerotic heart disease of native coronary artery without angina pectoris: Secondary | ICD-10-CM | POA: Insufficient documentation

## 2019-02-01 ENCOUNTER — Encounter (HOSPITAL_COMMUNITY)
Admission: RE | Admit: 2019-02-01 | Discharge: 2019-02-01 | Disposition: A | Discharge status: Home / Self Care | Payer: Self-pay | Source: Ambulatory Visit | Attending: Cardiology | Admitting: Cardiology

## 2019-02-03 ENCOUNTER — Encounter (HOSPITAL_COMMUNITY): Payer: Self-pay

## 2019-02-06 ENCOUNTER — Encounter (HOSPITAL_COMMUNITY): Payer: Self-pay

## 2019-02-08 ENCOUNTER — Other Ambulatory Visit: Payer: Self-pay

## 2019-02-08 ENCOUNTER — Encounter (HOSPITAL_COMMUNITY)
Admission: RE | Admit: 2019-02-08 | Discharge: 2019-02-08 | Disposition: A | Discharge status: Home / Self Care | Payer: Self-pay | Source: Ambulatory Visit | Attending: Cardiology | Admitting: Cardiology

## 2019-02-10 ENCOUNTER — Encounter (HOSPITAL_COMMUNITY): Payer: Self-pay

## 2019-02-13 ENCOUNTER — Telehealth (HOSPITAL_COMMUNITY): Payer: Self-pay | Admitting: *Deleted

## 2019-02-13 ENCOUNTER — Encounter (HOSPITAL_COMMUNITY): Payer: Self-pay

## 2019-02-13 NOTE — Telephone Encounter (Signed)
Contacted patient to notify of Cardiac Rehab department closure x2 weeks. Patient not at home, message left with patient's wife who will let patient know.  Seward Carol, Exercise Physiologist Cardiac and Pulmonary Rehabilitation

## 2019-02-15 ENCOUNTER — Encounter (HOSPITAL_COMMUNITY): Payer: Self-pay

## 2019-02-17 ENCOUNTER — Encounter (HOSPITAL_COMMUNITY): Payer: Self-pay

## 2019-02-20 ENCOUNTER — Encounter (HOSPITAL_COMMUNITY): Payer: Self-pay

## 2019-02-21 ENCOUNTER — Telehealth (HOSPITAL_COMMUNITY): Payer: Self-pay | Admitting: *Deleted

## 2019-02-21 NOTE — Telephone Encounter (Signed)
Called to notify patient that the cardiac and pulmonary rehabilitation department will be closed for 4 weeks due to COVID-19 restrictions. Pt verbalized understanding.  Patient is exercising at home at this time. Sol Passer, MS, ACSM CEP

## 2019-02-22 ENCOUNTER — Encounter (HOSPITAL_COMMUNITY): Payer: Self-pay

## 2019-02-23 DIAGNOSIS — Z85820 Personal history of malignant melanoma of skin: Secondary | ICD-10-CM | POA: Diagnosis not present

## 2019-02-23 DIAGNOSIS — L812 Freckles: Secondary | ICD-10-CM | POA: Diagnosis not present

## 2019-02-23 DIAGNOSIS — L821 Other seborrheic keratosis: Secondary | ICD-10-CM | POA: Diagnosis not present

## 2019-02-23 DIAGNOSIS — D225 Melanocytic nevi of trunk: Secondary | ICD-10-CM | POA: Diagnosis not present

## 2019-02-23 DIAGNOSIS — D1801 Hemangioma of skin and subcutaneous tissue: Secondary | ICD-10-CM | POA: Diagnosis not present

## 2019-02-23 DIAGNOSIS — D2271 Melanocytic nevi of right lower limb, including hip: Secondary | ICD-10-CM | POA: Diagnosis not present

## 2019-02-23 DIAGNOSIS — L570 Actinic keratosis: Secondary | ICD-10-CM | POA: Diagnosis not present

## 2019-02-24 ENCOUNTER — Encounter (HOSPITAL_COMMUNITY): Payer: Self-pay

## 2019-02-27 ENCOUNTER — Other Ambulatory Visit: Payer: Self-pay | Admitting: Surgery

## 2019-02-27 ENCOUNTER — Encounter (HOSPITAL_COMMUNITY): Payer: Self-pay

## 2019-02-27 DIAGNOSIS — J90 Pleural effusion, not elsewhere classified: Secondary | ICD-10-CM

## 2019-03-01 ENCOUNTER — Encounter (HOSPITAL_COMMUNITY): Payer: Self-pay

## 2019-03-03 ENCOUNTER — Encounter (HOSPITAL_COMMUNITY): Payer: Self-pay

## 2019-03-06 ENCOUNTER — Encounter (HOSPITAL_COMMUNITY): Payer: Self-pay

## 2019-03-08 ENCOUNTER — Encounter (HOSPITAL_COMMUNITY): Payer: Self-pay

## 2019-03-10 ENCOUNTER — Encounter (HOSPITAL_COMMUNITY): Payer: Self-pay

## 2019-03-10 ENCOUNTER — Telehealth (HOSPITAL_COMMUNITY): Payer: Self-pay | Admitting: *Deleted

## 2019-03-10 NOTE — Telephone Encounter (Signed)
Called to notify patient that the cardiac and pulmonary rehabilitation department remains closed at this time due to COVID-19 restrictions. Pt verbalized understanding.   Sol Passer, MS, ACSM CEP 03/10/2019 1227

## 2019-03-13 ENCOUNTER — Encounter (HOSPITAL_COMMUNITY): Payer: Self-pay

## 2019-03-15 ENCOUNTER — Encounter (HOSPITAL_COMMUNITY): Payer: Self-pay

## 2019-03-17 ENCOUNTER — Encounter (HOSPITAL_COMMUNITY): Payer: Self-pay

## 2019-03-20 ENCOUNTER — Encounter (HOSPITAL_COMMUNITY): Payer: Self-pay

## 2019-03-22 ENCOUNTER — Encounter (HOSPITAL_COMMUNITY): Payer: Self-pay

## 2019-03-24 ENCOUNTER — Encounter (HOSPITAL_COMMUNITY): Payer: Self-pay

## 2019-03-27 ENCOUNTER — Encounter (HOSPITAL_COMMUNITY): Payer: Self-pay

## 2019-03-29 ENCOUNTER — Encounter (HOSPITAL_COMMUNITY): Payer: Self-pay

## 2019-03-31 ENCOUNTER — Encounter (HOSPITAL_COMMUNITY): Payer: Self-pay

## 2019-04-03 ENCOUNTER — Encounter (HOSPITAL_COMMUNITY): Payer: Self-pay

## 2019-04-05 ENCOUNTER — Encounter (HOSPITAL_COMMUNITY): Payer: Self-pay

## 2019-04-07 ENCOUNTER — Encounter (HOSPITAL_COMMUNITY): Payer: Self-pay

## 2019-04-10 ENCOUNTER — Encounter (HOSPITAL_COMMUNITY): Payer: Self-pay

## 2019-04-12 ENCOUNTER — Encounter (HOSPITAL_COMMUNITY): Payer: Self-pay

## 2019-04-14 ENCOUNTER — Encounter (HOSPITAL_COMMUNITY): Payer: Self-pay

## 2019-04-17 ENCOUNTER — Encounter (HOSPITAL_COMMUNITY): Payer: Self-pay

## 2019-04-19 ENCOUNTER — Encounter (HOSPITAL_COMMUNITY): Payer: Self-pay

## 2019-04-21 ENCOUNTER — Encounter (HOSPITAL_COMMUNITY): Payer: Self-pay

## 2019-04-26 ENCOUNTER — Encounter (HOSPITAL_COMMUNITY): Payer: Self-pay

## 2019-04-28 ENCOUNTER — Encounter (HOSPITAL_COMMUNITY): Payer: Self-pay

## 2019-05-01 ENCOUNTER — Encounter (HOSPITAL_COMMUNITY): Payer: Self-pay

## 2019-05-03 ENCOUNTER — Other Ambulatory Visit: Payer: Self-pay

## 2019-05-03 ENCOUNTER — Encounter: Payer: Self-pay | Admitting: Surgery

## 2019-05-03 ENCOUNTER — Ambulatory Visit (INDEPENDENT_AMBULATORY_CARE_PROVIDER_SITE_OTHER): Payer: Medicare Other | Admitting: Surgery

## 2019-05-03 ENCOUNTER — Encounter (HOSPITAL_COMMUNITY): Payer: Self-pay

## 2019-05-03 ENCOUNTER — Ambulatory Visit
Admission: RE | Admit: 2019-05-03 | Discharge: 2019-05-03 | Disposition: A | Discharge status: Home / Self Care | Payer: Medicare Other | Source: Ambulatory Visit | Attending: Surgery | Admitting: Surgery

## 2019-05-03 VITALS — BP 130/65 | HR 57 | Temp 97.5°F | Resp 20 | Ht 70.0 in | Wt 177.0 lb

## 2019-05-03 DIAGNOSIS — R918 Other nonspecific abnormal finding of lung field: Secondary | ICD-10-CM

## 2019-05-03 DIAGNOSIS — J984 Other disorders of lung: Secondary | ICD-10-CM | POA: Diagnosis not present

## 2019-05-03 DIAGNOSIS — J90 Pleural effusion, not elsewhere classified: Secondary | ICD-10-CM

## 2019-05-03 NOTE — Progress Notes (Signed)
     HPI:  Joseph Roman returns today for follow-up of multiple small bilateral pulmonary nodules that were seen on CT scan after left VATS for drainage of an empyema and decortication of left lung in February 2018.  I last saw him on 10/06/2017 and CT scan at that time showed these nodules to be stable.  We thought that we should repeated in 18 months to be sure that they were stable.  He has been doing well and continues to work.  The only change to his health history is he had a right knee arthroscopy for torn meniscus this year.  Current Outpatient Medications  Medication Sig Dispense Refill  . aspirin 81 MG tablet Take 81 mg by mouth daily.    Marland Kitchen levothyroxine (SYNTHROID, LEVOTHROID) 112 MCG tablet Take 112 mcg by mouth daily.    . nitroGLYCERIN (NITROSTAT) 0.4 MG SL tablet Place 1 tablet (0.4 mg total) under the tongue as needed. 25 tablet 3  . pravastatin (PRAVACHOL) 10 MG tablet TAKE ONE TABLET BY MOUTH DAILY 90 tablet 0  . vitamin B-12 (CYANOCOBALAMIN) 1000 MCG tablet Take 1,000 mcg by mouth daily.     No current facility-administered medications for this visit.      Physical Exam: BP 130/65   Pulse (!) 57   Temp (!) 97.5 F (36.4 C) (Skin)   Resp 20   Ht 5\' 10"  (1.778 m)   Wt 177 lb (80.3 kg)   SpO2 96% Comment: RA  BMI 25.40 kg/m  He looks well. Lung exam is clear.   Diagnostic Tests:  CLINICAL DATA:  Pleural effusion.  EXAM: CT CHEST WITHOUT CONTRAST  TECHNIQUE: Multidetector CT imaging of the chest was performed following the standard protocol without IV contrast.  COMPARISON:  09/29/2017, 05/26/2017.  FINDINGS: Cardiovascular: Atherosclerotic calcification of the aorta, aortic valve and coronary arteries. Heart is at the upper limits of normal in size. No pericardial effusion.  Mediastinum/Nodes: No pathologically enlarged mediastinal or axillary lymph nodes. Hilar regions are difficult to evaluate without IV contrast. Esophagus is grossly  unremarkable.  Lungs/Pleura: 6 mm apical segment right upper lobe nodule is unchanged from 05/26/2017 and considered benign. Calcified granuloma in the right middle lobe. Additional smaller bilateral pulmonary nodules are also unchanged. Pleuroparenchymal scarring in the left lower lobe, unchanged. No pleural fluid. Airway is unremarkable.  Upper Abdomen: Visualized portions of the liver, gallbladder, adrenal glands, left kidney, spleen, pancreas, stomach and bowel are grossly unremarkable. No upper abdominal adenopathy.  Musculoskeletal: Degenerative changes in the spine. No worrisome lytic or sclerotic lesions.  IMPRESSION: 1. Pleuroparenchymal scarring in the lower left hemithorax. No pleural fluid. Bilateral pulmonary nodules are unchanged and considered benign. 2. Aortic atherosclerosis (ICD10-170.0). Coronary artery calcification.   Electronically Signed   By: Lorin Picket M.D.   On: 05/03/2019 10:54   Impression:  His CT scan shows no change in the bilateral pulmonary nodules and they are considered benign at this point.  I do not think he requires any further follow-up for these nodules.    Plan:  He will continue to follow-up with Dr. Virgina Jock and I will be happy to see him back if the need arises.  I spent 10 minutes performing this established patient evaluation and > 50% of this time was spent face to face counseling and coordinating the care of this patient's small bilateral lung nodules.    Joseph Pollack, MD [facility] [phone]

## 2019-05-05 ENCOUNTER — Encounter (HOSPITAL_COMMUNITY): Payer: Self-pay

## 2019-05-08 ENCOUNTER — Encounter (HOSPITAL_COMMUNITY): Payer: Self-pay

## 2019-05-10 ENCOUNTER — Encounter (HOSPITAL_COMMUNITY): Payer: Self-pay

## 2019-05-12 ENCOUNTER — Encounter (HOSPITAL_COMMUNITY): Payer: Self-pay

## 2019-05-15 ENCOUNTER — Encounter (HOSPITAL_COMMUNITY): Payer: Self-pay

## 2019-05-15 DIAGNOSIS — E7849 Other hyperlipidemia: Secondary | ICD-10-CM | POA: Diagnosis not present

## 2019-05-15 DIAGNOSIS — Z125 Encounter for screening for malignant neoplasm of prostate: Secondary | ICD-10-CM | POA: Diagnosis not present

## 2019-05-15 DIAGNOSIS — E038 Other specified hypothyroidism: Secondary | ICD-10-CM | POA: Diagnosis not present

## 2019-05-17 ENCOUNTER — Encounter (HOSPITAL_COMMUNITY): Payer: Self-pay

## 2019-05-19 ENCOUNTER — Other Ambulatory Visit: Payer: Self-pay | Admitting: Cardiology

## 2019-05-19 ENCOUNTER — Encounter (HOSPITAL_COMMUNITY): Payer: Self-pay

## 2019-05-22 ENCOUNTER — Encounter (HOSPITAL_COMMUNITY): Payer: Self-pay

## 2019-05-22 DIAGNOSIS — R82998 Other abnormal findings in urine: Secondary | ICD-10-CM | POA: Diagnosis not present

## 2019-05-24 ENCOUNTER — Encounter (HOSPITAL_COMMUNITY): Payer: Self-pay

## 2019-05-24 DIAGNOSIS — N400 Enlarged prostate without lower urinary tract symptoms: Secondary | ICD-10-CM | POA: Diagnosis not present

## 2019-05-24 DIAGNOSIS — D539 Nutritional anemia, unspecified: Secondary | ICD-10-CM | POA: Diagnosis not present

## 2019-05-24 DIAGNOSIS — Z0000 Encounter for general adult medical examination without abnormal findings: Secondary | ICD-10-CM | POA: Diagnosis not present

## 2019-05-24 DIAGNOSIS — D692 Other nonthrombocytopenic purpura: Secondary | ICD-10-CM | POA: Diagnosis not present

## 2019-05-24 DIAGNOSIS — I252 Old myocardial infarction: Secondary | ICD-10-CM | POA: Diagnosis not present

## 2019-05-24 DIAGNOSIS — Z86006 Personal history of melanoma in-situ: Secondary | ICD-10-CM | POA: Insufficient documentation

## 2019-05-24 DIAGNOSIS — R918 Other nonspecific abnormal finding of lung field: Secondary | ICD-10-CM | POA: Diagnosis not present

## 2019-05-24 DIAGNOSIS — M1990 Unspecified osteoarthritis, unspecified site: Secondary | ICD-10-CM | POA: Diagnosis not present

## 2019-05-24 DIAGNOSIS — J90 Pleural effusion, not elsewhere classified: Secondary | ICD-10-CM | POA: Diagnosis not present

## 2019-05-24 DIAGNOSIS — E039 Hypothyroidism, unspecified: Secondary | ICD-10-CM | POA: Diagnosis not present

## 2019-05-24 DIAGNOSIS — E538 Deficiency of other specified B group vitamins: Secondary | ICD-10-CM | POA: Diagnosis not present

## 2019-05-24 DIAGNOSIS — H9192 Unspecified hearing loss, left ear: Secondary | ICD-10-CM | POA: Diagnosis not present

## 2019-05-26 ENCOUNTER — Encounter (HOSPITAL_COMMUNITY): Payer: Self-pay

## 2019-05-29 ENCOUNTER — Encounter (HOSPITAL_COMMUNITY): Payer: Self-pay

## 2019-05-31 ENCOUNTER — Telehealth (HOSPITAL_COMMUNITY): Payer: Self-pay | Admitting: *Deleted

## 2019-05-31 NOTE — Telephone Encounter (Signed)
Called patient to update on status of the maintenance Cardiac Rehab exercise classes.  At this point they remain on hold due to COVID-19 pandemic precautions.

## 2019-08-08 ENCOUNTER — Telehealth (HOSPITAL_COMMUNITY): Payer: Self-pay | Admitting: Internal Medicine

## 2019-08-09 ENCOUNTER — Other Ambulatory Visit: Payer: Self-pay | Admitting: Cardiology

## 2019-08-17 ENCOUNTER — Other Ambulatory Visit: Payer: Self-pay | Admitting: Cardiology

## 2019-09-01 DIAGNOSIS — Z23 Encounter for immunization: Secondary | ICD-10-CM | POA: Diagnosis not present

## 2019-09-04 DIAGNOSIS — L812 Freckles: Secondary | ICD-10-CM | POA: Diagnosis not present

## 2019-09-04 DIAGNOSIS — Z85820 Personal history of malignant melanoma of skin: Secondary | ICD-10-CM | POA: Diagnosis not present

## 2019-09-04 DIAGNOSIS — D225 Melanocytic nevi of trunk: Secondary | ICD-10-CM | POA: Diagnosis not present

## 2019-09-04 DIAGNOSIS — L821 Other seborrheic keratosis: Secondary | ICD-10-CM | POA: Diagnosis not present

## 2019-09-04 DIAGNOSIS — L570 Actinic keratosis: Secondary | ICD-10-CM | POA: Diagnosis not present

## 2019-09-11 DIAGNOSIS — H52203 Unspecified astigmatism, bilateral: Secondary | ICD-10-CM | POA: Diagnosis not present

## 2019-09-11 DIAGNOSIS — H02834 Dermatochalasis of left upper eyelid: Secondary | ICD-10-CM | POA: Diagnosis not present

## 2019-09-11 DIAGNOSIS — H26493 Other secondary cataract, bilateral: Secondary | ICD-10-CM | POA: Diagnosis not present

## 2019-09-11 DIAGNOSIS — H02831 Dermatochalasis of right upper eyelid: Secondary | ICD-10-CM | POA: Diagnosis not present

## 2019-09-19 ENCOUNTER — Other Ambulatory Visit (HOSPITAL_COMMUNITY): Payer: Self-pay | Admitting: *Deleted

## 2019-09-19 DIAGNOSIS — Z955 Presence of coronary angioplasty implant and graft: Secondary | ICD-10-CM

## 2019-09-29 ENCOUNTER — Other Ambulatory Visit: Payer: Self-pay

## 2019-09-29 DIAGNOSIS — Z20822 Contact with and (suspected) exposure to covid-19: Secondary | ICD-10-CM

## 2019-09-29 DIAGNOSIS — Z20828 Contact with and (suspected) exposure to other viral communicable diseases: Secondary | ICD-10-CM | POA: Diagnosis not present

## 2019-09-30 LAB — NOVEL CORONAVIRUS, NAA: SARS-CoV-2, NAA: NOT DETECTED

## 2019-10-05 NOTE — Progress Notes (Signed)
Joseph Roman will begin the 12-week, 2x/wk PREP at [facility] on 10/10/19 to run on Tu/Th 2:30-3:45.

## 2019-10-19 NOTE — Progress Notes (Signed)
[facility] PREP Weekly Session   Patient Details  Name: Joseph Roman MRN: [id number] Date of Birth: [birth year] Age: 85 y.o. PCP: Shon Baton, MD  There were no vitals filed for this visit.   Cardio:  2 min march in place: 171 30 Second Chair Stand: 14 RT arm curl, 8lbs: 16  Balance:  Double: 0 Single: 2 (assist) Tandem: 1  [name] 10/19/2019, 9:13 PM

## 2019-10-22 NOTE — Progress Notes (Signed)
Lucie Leather Date of Birth: 01-Apr-1930 Medical Record [id number]  History of Present Illness: Mr. Delfavero is seen for yearly followup of CAD. He has a history of coronary disease and is status post inferior lateral myocardial infarction in June of 2009. He had stenting of the mid left circumflex coronary emergently with a 2.75 x 23 mm Promus stent. His last stress test in July of 2015 was without ischemia at excellent exercise level. He is s/p VATS and drainage of a left pleural effusion by Dr. Cyndia Bent in February 2018 for empyema with PNA. Follow up CT in June 2020 was stable. He reports he had knee surgery for a torn meniscus last January and this went well.   On followup today he is doing very well. He is still working full time. Hasn't been exercising as much since Cardiac Rehab shut down but is starting to exercise more.  He denies any chest pain, SOB, palpitations, or dizziness. No edema. Energy level is OK.   Current Outpatient Medications on File Prior to Visit  Medication Sig Dispense Refill  . aspirin 81 MG tablet Take 81 mg by mouth daily.    Marland Kitchen levothyroxine (SYNTHROID, LEVOTHROID) 112 MCG tablet Take 112 mcg by mouth daily.    . nitroGLYCERIN (NITROSTAT) 0.4 MG SL tablet Place 1 tablet (0.4 mg total) under the tongue as needed. 25 tablet 3  . pravastatin (PRAVACHOL) 10 MG tablet TAKE ONE TABLET BY MOUTH DAILY 60 tablet 0  . vitamin B-12 (CYANOCOBALAMIN) 1000 MCG tablet Take 1,000 mcg by mouth daily.     No current facility-administered medications on file prior to visit.     No Known Allergies  Past Medical History:  Diagnosis Date  . Acute inferolateral myocardial infarction ([place]) 04/2008   2.75x23 Promus Lcx  . Arthritis    "comes and goes; hits everywhere" (12/29/2016)  . Coronary artery disease   . Dyslipidemia    Mild dyslipidemia  . GERD (gastroesophageal reflux disease)   . Hypothyroidism   . Pneumonia 12/29/2016    Past Surgical History:  Procedure  Laterality Date  . CATARACT EXTRACTION W/ INTRAOCULAR LENS  IMPLANT, BILATERAL Bilateral 2010-2016   "right-left"  . COLONOSCOPY W/ BIOPSIES AND POLYPECTOMY  04/21/2005  . CORONARY ANGIOPLASTY WITH STENT PLACEMENT  05/07/2008   Ejection fraction was estimated at 60%  . PLEURAL EFFUSION DRAINAGE Left 12/31/2016   Procedure: DRAINAGE OF PLEURAL EFFUSION/empyema;  Surgeon: Gaye Pollack, MD;  Location: [place];  Service: Thoracic;  Laterality: Left;  . TONSILLECTOMY    . VIDEO ASSISTED THORACOSCOPY Left 12/31/2016   Procedure: VIDEO ASSISTED THORACOSCOPY;  Surgeon: Gaye Pollack, MD;  Location: [facility] OR;  Service: Thoracic;  Laterality: Left;    Social History   Tobacco Use  Smoking Status Never Smoker  Smokeless Tobacco Never Used    Social History   Substance and Sexual Activity  Alcohol Use Yes  . Alcohol/week: 1.0 standard drinks  . Types: 1 Cans of beer per week    Family History  Problem Relation Age of Onset  . Cancer - Lung Father     Review of Systems: As noted in history of present illness.  All other systems were reviewed and are negative.  Physical Exam: BP (!) 146/76   Pulse 67   Ht 5\' 10"  (1.778 m)   Wt 179 lb (81.2 kg)   SpO2 97%   BMI 25.68 kg/m  GENERAL:  Well appearing WM in NAD HEENT:  PERRL, EOMI, sclera are  clear. Oropharynx is clear. NECK:  No jugular venous distention, carotid upstroke brisk and symmetric, no bruits, no thyromegaly or adenopathy LUNGS:  Clear to auscultation bilaterally CHEST:  Unremarkable HEART:  RRR,  PMI not displaced or sustained,S1 and S2 within normal limits, no S3, no S4: no clicks, no rubs, no murmurs ABD:  Soft, nontender. BS +, no masses or bruits. No hepatomegaly, no splenomegaly EXT:  2 + pulses throughout, no edema, no cyanosis no clubbing SKIN:  Warm and dry.  No rashes NEURO:  Alert and oriented x 3. Cranial nerves II through XII intact. PSYCH:  Cognitively intact     LABORATORY DATA: ECG today shows NSR rate 63  beats per minute. It is otherwise normal. I have personally reviewed and interpreted this study.  Labs reviewed from Dr. Virgina Jock 04/12/15: Normal CBC, chemistry panel, TSH and PSA. Cholesterol- 117, trig-53, HDL-40, LDL 66. Dated 04/22/17: cholesterol 115, triglycerides 41, HDL 38, LDL 69. Hgb, CMET and TSH normal. Dated 04/28/18: cholesterol 115, triglycerides 66, HDL 41, LDL 61. CBC, CMET, TSH all normal.  Dated 05/15/19: cholesterol 126, triglycerides 61, HDL 40, LDL 74. CBC, CMET and TSH normal  Assessment / Plan: 1. Coronary disease with remote myocardial infarction status post stenting of the left circumflex coronary in 2009 with a  DES. GXT negative in July 2015. Continue ASA and statin.  He remains asymptomatic.  2. Dyslipidemia-Last LDL was a little higher than before probably reflecting lack of exercise. Will continue to monitor.   Follow up in one year.

## 2019-10-25 ENCOUNTER — Other Ambulatory Visit: Payer: Self-pay

## 2019-10-25 ENCOUNTER — Ambulatory Visit (INDEPENDENT_AMBULATORY_CARE_PROVIDER_SITE_OTHER): Payer: Medicare Other | Admitting: Cardiology

## 2019-10-25 ENCOUNTER — Encounter: Payer: Self-pay | Admitting: Cardiology

## 2019-10-25 VITALS — BP 146/76 | HR 67 | Ht 70.0 in | Wt 179.0 lb

## 2019-10-25 DIAGNOSIS — I2510 Atherosclerotic heart disease of native coronary artery without angina pectoris: Secondary | ICD-10-CM

## 2019-10-25 DIAGNOSIS — E7800 Pure hypercholesterolemia, unspecified: Secondary | ICD-10-CM

## 2019-10-25 NOTE — Progress Notes (Signed)
[facility] PREP Weekly Session   Patient Details  Name: Joseph Roman MRN: [id number] Date of Birth: [birth year] Age: 85 y.o. PCP: Shon Baton, MD  Vitals:   10/24/19 1300  Weight: 178 lb (80.7 kg)    [facility] Weekly seesion - 10/25/19 1200      Weekly Session   Topic Discussed  Eating for the season    Minutes exercised this week  --   Cardio 120 min, Strength 30, Flexbility 5 min      Fun Things you did since last meeting: Finished two books Things you are grateful for: Health and family    Barnett Hatter 10/25/2019, 12:21 PM

## 2019-10-25 NOTE — Progress Notes (Signed)
[facility] PREP Weekly Session   Patient Details  Name: Joseph Roman MRN: [id number] Date of Birth: 1930-04-11 Age: 85 y.o. PCP: Shon Baton, MD  Vitals:   10/17/19 1500  Weight: 179 lb 3.2 oz (81.3 kg)    [facility] Weekly seesion - 10/25/19 1600      Weekly Session   Topic Discussed  Healthy eating tips;Health habits        Barnett Hatter 10/25/2019, 4:38 PM

## 2019-11-01 NOTE — Progress Notes (Signed)
[facility] PREP Weekly Session   Patient Details  Name: Joseph Roman MRN: [id number] Date of Birth: 06-Nov-1930 Age: 85 y.o. PCP: Shon Baton, MD  Vitals:   11/01/19 1153  Weight: 180 lb 9.6 oz (81.9 kg)    [facility] Weekly seesion - 10/31/19 1153      Weekly Session   Topic Discussed  Other ways to be active    Minutes exercised this week  70 minutes   cardio        [name] 11/01/2019, 11:53 AM

## 2019-11-07 NOTE — Progress Notes (Signed)
[facility] PREP Weekly Session   Patient Details  Name: Joseph Roman MRN: [id number] Date of Birth: 09/04/1930 Age: 85 y.o. PCP: Shon Baton, MD  Vitals:   11/07/19 1728  Weight: 179 lb 8 oz (81.4 kg)    [facility] Weekly seesion - 11/07/19 1700      Weekly Session   Topic Discussed  Restaurant Eating   Salt/Sodium amounts in common foods    Minutes exercised this week  88 minutes   48 cardio, 30 min strength, 10 min flexibility     Things to be grateful for: Family and health  Barnett Hatter 11/07/2019, 5:31 PM

## 2019-11-14 NOTE — Progress Notes (Signed)
[facility] PREP Weekly Session   Patient Details  Name: CONEY MUSSEY MRN: [id number] Date of Birth: 1930-01-05 Age: 85 y.o. PCP: Shon Baton, MD  Vitals:   11/14/19 1818  Weight: 177 lb (80.3 kg)    [facility] Weekly seesion - 11/14/19 1800      Weekly Session   Topic Discussed  Stress management and problem solving    Minutes exercised this week  125 minutes   75 cardio, strength 30, no flexibility noted      Things you are grateful for: health and family   Barnett Hatter 11/14/2019, 6:19 PM

## 2019-11-16 ENCOUNTER — Ambulatory Visit: Payer: Medicare Other | Attending: Internal Medicine

## 2019-11-16 DIAGNOSIS — Z20822 Contact with and (suspected) exposure to covid-19: Secondary | ICD-10-CM

## 2019-11-17 LAB — NOVEL CORONAVIRUS, NAA: SARS-CoV-2, NAA: NOT DETECTED

## 2019-11-22 NOTE — Progress Notes (Signed)
[facility] PREP Weekly Session   Patient Details  Name: Joseph Roman MRN: [id number] Date of Birth: May 13, 1930 Age: 85 y.o. PCP: Shon Baton, MD  Vitals:   11/21/19 1430  Weight: 177 lb 6.4 oz (80.5 kg)    [facility] Weekly seesion - 11/22/19 0600      Weekly Session   Topic Discussed  --   half cardio, half strength training    Minutes exercised this week  70 minutes   30 min of cardio, 30 min of strength, 10 min of flexibility      Fun things you did since last meeting: spent time with close relatives Things you are grateful for: health and family   Barnett Hatter 11/22/2019, 6:58 AM

## 2019-12-04 ENCOUNTER — Ambulatory Visit: Payer: Medicare Other | Attending: Internal Medicine

## 2019-12-04 DIAGNOSIS — Z20822 Contact with and (suspected) exposure to covid-19: Secondary | ICD-10-CM

## 2019-12-05 LAB — NOVEL CORONAVIRUS, NAA: SARS-CoV-2, NAA: NOT DETECTED

## 2019-12-11 ENCOUNTER — Ambulatory Visit: Payer: Medicare Other | Attending: Internal Medicine

## 2019-12-11 DIAGNOSIS — Z23 Encounter for immunization: Secondary | ICD-10-CM

## 2019-12-11 NOTE — Progress Notes (Signed)
   Covid-19 Vaccination Clinic  Name:  Joseph Roman    MRN: [id number] DOB: Oct 02, 1930  12/11/2019  Joseph Roman was observed post Covid-19 immunization for 15 minutes without incidence. He was provided with Vaccine Information Sheet and instruction to access the V-Safe system.   Joseph Roman was instructed to call 911 with any severe reactions post vaccine: Marland Kitchen Difficulty breathing  . Swelling of your face and throat  . A fast heartbeat  . A bad rash all over your body  . Dizziness and weakness    Immunizations Administered    Name Date Dose VIS Date Route   Pfizer COVID-19 Vaccine 12/11/2019  9:19 AM 0.3 mL 11/10/2019 Intramuscular   Manufacturer: [employer]   Lot: [id number]   [place]: [id number]

## 2019-12-31 ENCOUNTER — Ambulatory Visit: Payer: Medicare Other | Attending: Internal Medicine

## 2019-12-31 DIAGNOSIS — Z23 Encounter for immunization: Secondary | ICD-10-CM | POA: Insufficient documentation

## 2019-12-31 NOTE — Progress Notes (Signed)
   Covid-19 Vaccination Clinic  Name:  Joseph Roman    MRN: [id number] DOB: 1930-01-13  12/31/2019  [name] was observed post Covid-19 immunization for 15 minutes without incidence. He was provided with Vaccine Information Sheet and instruction to access the V-Safe system.   [name] was instructed to call 911 with any severe reactions post vaccine: Marland Kitchen Difficulty breathing  . Swelling of your face and throat  . A fast heartbeat  . A bad rash all over your body  . Dizziness and weakness    Immunizations Administered    Name Date Dose VIS Date Route   Pfizer COVID-19 Vaccine 12/31/2019  9:53 AM 0.3 mL 11/10/2019 Intramuscular   Manufacturer: [place]   Lot: [id number]   [place]: [id number]

## 2020-01-12 NOTE — Progress Notes (Unsigned)
[place] Report   Patient Details  Name: Joseph Roman MRN: [id number] Date of Birth: 1930/10/20 Age: 86 y.o. PCP: Shon Baton, MD  Vitals:   01/12/20 1209  BP: 120/68  Pulse: 67  Weight: 175 lb 6.4 oz (79.6 kg)  Height: 5\' 10"  (1.778 m)     [facility] Eval - 01/12/20 1200      Measurement   Neck measurement  16 Inches    Waist Circumference  40.5 inches      Information for Trainer   Goals  Maintain flexibilty, wt, improve balance       Timed Up and Go (TUGS)   Timed Up and Go  Low risk <9 seconds      Mobility and Daily Activities   I find it easy to walk up or down two or more flights of stairs.  4    I have no trouble taking out the trash.  4    I do housework such as vacuuming and dusting on my own without difficulty.  4    I can easily lift a gallon of milk (8lbs).  4    I can easily walk a mile.  4    I have no trouble reaching into high cupboards or reaching down to pick up something from the floor.  4    I do not have trouble doing out-door work such as Armed forces logistics/support/administrative officer, raking leaves, or gardening.  4      Mobility and Daily Activities   I feel younger than my age.  4    I feel independent.  4    I feel energetic.  3    I live an active life.   4    I feel strong.  4    I feel healthy.  4    I feel active as other people my age.  4      How fit and strong are you.   Fit and Strong Total Score  55      Past Medical History:  Diagnosis Date  . Acute inferolateral myocardial infarction ([place]) 04/2008   2.75x23 Promus Lcx  . Arthritis    "comes and goes; hits everywhere" (12/29/2016)  . Coronary artery disease   . Dyslipidemia    Mild dyslipidemia  . GERD (gastroesophageal reflux disease)   . Hypothyroidism   . Pneumonia 12/29/2016   Past Surgical History:  Procedure Laterality Date  . CATARACT EXTRACTION W/ INTRAOCULAR LENS  IMPLANT, BILATERAL Bilateral 2010-2016   "right-left"  . COLONOSCOPY W/ BIOPSIES AND POLYPECTOMY   04/21/2005  . CORONARY ANGIOPLASTY WITH STENT PLACEMENT  05/07/2008   Ejection fraction was estimated at 60%  . PLEURAL EFFUSION DRAINAGE Left 12/31/2016   Procedure: DRAINAGE OF PLEURAL EFFUSION/empyema;  Surgeon: Gaye Pollack, MD;  Location: [place];  Service: Thoracic;  Laterality: Left;  . TONSILLECTOMY    . VIDEO ASSISTED THORACOSCOPY Left 12/31/2016   Procedure: VIDEO ASSISTED THORACOSCOPY;  Surgeon: Gaye Pollack, MD;  [facility] OR;  Service: Thoracic;  Laterality: Left;   Social History   Tobacco Use  Smoking Status Never Smoker  Smokeless Tobacco Never Used     Final Fit test of PREP Improvements: 2 min march test  Needs continued work on balance  Plans to walk for exercise 3-4 days per week with barrier noted of work schedule.  Coached to make appts with self and honor those appts.     Barnett Hatter 01/12/2020,  12:17 PM

## 2020-02-08 ENCOUNTER — Other Ambulatory Visit: Payer: Self-pay | Admitting: Cardiology

## 2020-03-04 DIAGNOSIS — L821 Other seborrheic keratosis: Secondary | ICD-10-CM | POA: Diagnosis not present

## 2020-03-04 DIAGNOSIS — Z85820 Personal history of malignant melanoma of skin: Secondary | ICD-10-CM | POA: Diagnosis not present

## 2020-03-04 DIAGNOSIS — L812 Freckles: Secondary | ICD-10-CM | POA: Diagnosis not present

## 2020-03-04 DIAGNOSIS — L570 Actinic keratosis: Secondary | ICD-10-CM | POA: Diagnosis not present

## 2020-06-24 DIAGNOSIS — Z1152 Encounter for screening for COVID-19: Secondary | ICD-10-CM | POA: Diagnosis not present

## 2020-09-03 DIAGNOSIS — L570 Actinic keratosis: Secondary | ICD-10-CM | POA: Diagnosis not present

## 2020-09-03 DIAGNOSIS — L812 Freckles: Secondary | ICD-10-CM | POA: Diagnosis not present

## 2020-09-03 DIAGNOSIS — Z85820 Personal history of malignant melanoma of skin: Secondary | ICD-10-CM | POA: Diagnosis not present

## 2020-09-03 DIAGNOSIS — L821 Other seborrheic keratosis: Secondary | ICD-10-CM | POA: Diagnosis not present

## 2020-09-04 DIAGNOSIS — Z23 Encounter for immunization: Secondary | ICD-10-CM | POA: Diagnosis not present

## 2020-09-14 ENCOUNTER — Ambulatory Visit: Payer: Medicare Other | Attending: Internal Medicine

## 2020-09-14 DIAGNOSIS — Z23 Encounter for immunization: Secondary | ICD-10-CM

## 2020-09-14 NOTE — Progress Notes (Signed)
   Covid-19 Vaccination Clinic  Name:  Joseph Roman    MRN: [id number] DOB: 1930/02/07  09/14/2020  [name] was observed post Covid-19 immunization for 15 minutes without incident. He was provided with Vaccine Information Sheet and instruction to access the V-Safe system.   [name] was instructed to call 911 with any severe reactions post vaccine: Marland Kitchen Difficulty breathing  . Swelling of face and throat  . A fast heartbeat  . A bad rash all over body  . Dizziness and weakness

## 2020-09-16 DIAGNOSIS — H43813 Vitreous degeneration, bilateral: Secondary | ICD-10-CM | POA: Diagnosis not present

## 2020-09-16 DIAGNOSIS — Z961 Presence of intraocular lens: Secondary | ICD-10-CM | POA: Diagnosis not present

## 2020-09-16 DIAGNOSIS — H26493 Other secondary cataract, bilateral: Secondary | ICD-10-CM | POA: Diagnosis not present

## 2020-09-16 DIAGNOSIS — Z20822 Contact with and (suspected) exposure to covid-19: Secondary | ICD-10-CM | POA: Diagnosis not present

## 2020-09-16 DIAGNOSIS — Z1152 Encounter for screening for COVID-19: Secondary | ICD-10-CM | POA: Diagnosis not present

## 2020-09-16 DIAGNOSIS — H52203 Unspecified astigmatism, bilateral: Secondary | ICD-10-CM | POA: Diagnosis not present

## 2020-10-28 DIAGNOSIS — Z20822 Contact with and (suspected) exposure to covid-19: Secondary | ICD-10-CM | POA: Diagnosis not present

## 2020-10-28 NOTE — Progress Notes (Signed)
Lucie Leather Date of Birth: 11/05/1930 Medical Record #[id number]  History of Present Illness: Mr. Pickeral is seen for yearly followup of CAD. He has a history of coronary disease and is status post inferior lateral myocardial infarction in June of 2009. He had stenting of the mid left circumflex coronary emergently with a 2.75 x 23 mm Promus stent. His last stress test in July of 2015 was without ischemia at excellent exercise level. He is s/p VATS and drainage of a left pleural effusion by Dr. Cyndia Bent in February 2018 for empyema with PNA. Follow up CT in June 2020 was stable. He reports he had knee surgery for a torn meniscus last January and this went well.   On followup today he is doing very well. He is still working full time. Hasn't been exercising as much.  He denies any chest pain, SOB, palpitations, or dizziness. No edema. Energy level is good.  No complaints.  Current Outpatient Medications on File Prior to Visit  Medication Sig Dispense Refill  . aspirin 81 MG tablet Take 81 mg by mouth daily.    Marland Kitchen levothyroxine (SYNTHROID, LEVOTHROID) 112 MCG tablet Take 112 mcg by mouth daily.    . nitroGLYCERIN (NITROSTAT) 0.4 MG SL tablet Place 1 tablet (0.4 mg total) under the tongue as needed. 25 tablet 3  . pravastatin (PRAVACHOL) 10 MG tablet TAKE ONE TABLET BY MOUTH DAILY 90 tablet 2  . vitamin B-12 (CYANOCOBALAMIN) 1000 MCG tablet Take 1,000 mcg by mouth daily.     No current facility-administered medications on file prior to visit.    No Known Allergies  Past Medical History:  Diagnosis Date  . Acute inferolateral myocardial infarction ([place]) 04/2008   2.75x23 Promus Lcx  . Arthritis    "comes and goes; hits everywhere" (12/29/2016)  . Coronary artery disease   . Dyslipidemia    Mild dyslipidemia  . GERD (gastroesophageal reflux disease)   . Hypothyroidism   . Pneumonia 12/29/2016    Past Surgical History:  Procedure Laterality Date  . CATARACT EXTRACTION W/  INTRAOCULAR LENS  IMPLANT, BILATERAL Bilateral 2010-2016   "right-left"  . COLONOSCOPY W/ BIOPSIES AND POLYPECTOMY  04/21/2005  . CORONARY ANGIOPLASTY WITH STENT PLACEMENT  05/07/2008   Ejection fraction was estimated at 60%  . PLEURAL EFFUSION DRAINAGE Left 12/31/2016   Procedure: DRAINAGE OF PLEURAL EFFUSION/empyema;  Surgeon: Gaye Pollack, MD;  Location: [place];  Service: Thoracic;  Laterality: Left;  . TONSILLECTOMY    . VIDEO ASSISTED THORACOSCOPY Left 12/31/2016   Procedure: VIDEO ASSISTED THORACOSCOPY;  Surgeon: Gaye Pollack, MD;  Location: [facility] OR;  Service: Thoracic;  Laterality: Left;    Social History   Tobacco Use  Smoking Status Never Smoker  Smokeless Tobacco Never Used    Social History   Substance and Sexual Activity  Alcohol Use Yes  . Alcohol/week: 1.0 standard drink  . Types: 1 Cans of beer per week    Family History  Problem Relation Age of Onset  . Cancer - Lung Father     Review of Systems: As noted in history of present illness.  All other systems were reviewed and are negative.  Physical Exam: BP (!) 150/72   Pulse (!) 58   Temp (!) 97 F (36.1 C)   Ht 5\' 10"  (1.778 m)   Wt 178 lb 6.4 oz (80.9 kg)   SpO2 96%   BMI 25.60 kg/m  GENERAL:  Well appearing WM in NAD HEENT:  PERRL, EOMI, sclera  are clear. Oropharynx is clear. NECK:  No jugular venous distention, carotid upstroke brisk and symmetric, no bruits, no thyromegaly or adenopathy LUNGS:  Clear to auscultation bilaterally CHEST:  Unremarkable HEART:  RRR,  PMI not displaced or sustained,S1 and S2 within normal limits, no S3, no S4: no clicks, no rubs, no murmurs ABD:  Soft, nontender. BS +, no masses or bruits. No hepatomegaly, no splenomegaly EXT:  2 + pulses throughout, no edema, no cyanosis no clubbing SKIN:  Warm and dry.  No rashes NEURO:  Alert and oriented x 3. Cranial nerves II through XII intact. PSYCH:  Cognitively intact     LABORATORY DATA: ECG today shows NSR rate 58  beats per minute. It is otherwise normal. I have personally reviewed and interpreted this study.  Labs reviewed from Dr. Virgina Jock 04/12/15: Normal CBC, chemistry panel, TSH and PSA. Cholesterol- 117, trig-53, HDL-40, LDL 66. Dated 04/22/17: cholesterol 115, triglycerides 41, HDL 38, LDL 69. Hgb, CMET and TSH normal. Dated 04/28/18: cholesterol 115, triglycerides 66, HDL 41, LDL 61. CBC, CMET, TSH all normal.  Dated 05/15/19: cholesterol 126, triglycerides 61, HDL 40, LDL 74. CBC, CMET and TSH normal Dated 05/17/20: cholesterol 108, triglycerides 35, HDL 38, LDL 63, creatinine 1.1. plts 618K, TSH 7.4. free T4 110. Otherwise CMET and CBC normal.   Assessment / Plan: 1. Coronary disease with remote myocardial infarction status post stenting of the left circumflex coronary in 2009 with a  DES. GXT negative in July 2015. Continue ASA and statin.  He remains asymptomatic.  2. Dyslipidemia-Last LDL was at goal.   Follow up in one year.

## 2020-11-01 ENCOUNTER — Other Ambulatory Visit: Payer: Self-pay

## 2020-11-01 ENCOUNTER — Ambulatory Visit (INDEPENDENT_AMBULATORY_CARE_PROVIDER_SITE_OTHER): Payer: Medicare Other | Admitting: Cardiology

## 2020-11-01 ENCOUNTER — Encounter: Payer: Self-pay | Admitting: Cardiology

## 2020-11-01 VITALS — BP 150/72 | HR 58 | Temp 97.0°F | Ht 70.0 in | Wt 178.4 lb

## 2020-11-01 DIAGNOSIS — I2510 Atherosclerotic heart disease of native coronary artery without angina pectoris: Secondary | ICD-10-CM

## 2020-11-14 DIAGNOSIS — H02831 Dermatochalasis of right upper eyelid: Secondary | ICD-10-CM | POA: Diagnosis not present

## 2020-11-14 DIAGNOSIS — H53483 Generalized contraction of visual field, bilateral: Secondary | ICD-10-CM | POA: Diagnosis not present

## 2020-11-14 DIAGNOSIS — H57813 Brow ptosis, bilateral: Secondary | ICD-10-CM | POA: Diagnosis not present

## 2020-11-14 DIAGNOSIS — H02834 Dermatochalasis of left upper eyelid: Secondary | ICD-10-CM | POA: Diagnosis not present

## 2020-11-14 DIAGNOSIS — H0279 Other degenerative disorders of eyelid and periocular area: Secondary | ICD-10-CM | POA: Diagnosis not present

## 2020-11-14 DIAGNOSIS — H02423 Myogenic ptosis of bilateral eyelids: Secondary | ICD-10-CM | POA: Diagnosis not present

## 2020-11-19 DIAGNOSIS — H53483 Generalized contraction of visual field, bilateral: Secondary | ICD-10-CM | POA: Diagnosis not present

## 2020-11-19 DIAGNOSIS — H53482 Generalized contraction of visual field, left eye: Secondary | ICD-10-CM | POA: Diagnosis not present

## 2020-11-19 DIAGNOSIS — H53481 Generalized contraction of visual field, right eye: Secondary | ICD-10-CM | POA: Diagnosis not present

## 2020-12-05 ENCOUNTER — Telehealth: Payer: Self-pay

## 2020-12-05 NOTE — Telephone Encounter (Signed)
   [facility] Pre-operative Risk Assessment    HEARTCARE STAFF: - Please ensure there is not already an duplicate clearance open for this procedure. - Under Visit Info/Reason for Call, type in Other and utilize the format Clearance MM/DD/YY or Clearance TBD. Do not use dashes or single digits. - If request is for dental extraction, please clarify the # of teeth to be extracted.  Request for surgical clearance:  1. What type of surgery is being performed? Bilateral Upper Eyelid Blepharoplasty, Bilateral Direct Brow   2. When is this surgery scheduled? 12/17/2020   3. What type of clearance is required (medical clearance vs. Pharmacy clearance to hold med vs. Both)? Both  4. Are there any medications that need to be held prior to surgery and how long? ASA, When to stop and restart   5. Practice name and name of physician performing surgery? [facility], Dr. Sarajane Marek  6. What is the office phone number? [phone]   7.   What is the office fax number? [phone]  8.   Anesthesia type (None, local, MAC, general) ? MAC   Jacqulynn Cadet 12/05/2020, 11:19 AM  _________________________________________________________________   (provider comments below)

## 2020-12-05 NOTE — Telephone Encounter (Signed)
   Primary Cardiologist: No primary care provider on file.  Chart reviewed as part of pre-operative protocol coverage. Patient was contacted 12/05/2020 in reference to pre-operative risk assessment for pending surgery as outlined below.  Joseph Roman was last seen on 11/01/20 by Dr. [place]. At that time, he was doing very well from a CV standpoint with no c/o of angina. He can achieve well over 4 METS of activity without cardiac symptoms.   Therefore, based on ACC/AHA guidelines, the patient would be at acceptable risk for the planned procedure without further cardiovascular testing. He may hold his ASA 3-5 days prior to procedure and restart once stable from a bleeding standpoint per the surgical team.   The patient was advised that if he develops new symptoms prior to surgery to contact our office to arrange for a follow-up visit, and he verbalized understanding.  I will route this recommendation to the requesting party via Epic fax function and remove from pre-op pool. Please call with questions.  Georgie Chard, NP 12/05/2020, 1:48 PM

## 2020-12-18 DIAGNOSIS — H53453 Other localized visual field defect, bilateral: Secondary | ICD-10-CM | POA: Diagnosis not present

## 2020-12-18 DIAGNOSIS — H02831 Dermatochalasis of right upper eyelid: Secondary | ICD-10-CM | POA: Diagnosis not present

## 2020-12-18 DIAGNOSIS — H02413 Mechanical ptosis of bilateral eyelids: Secondary | ICD-10-CM | POA: Diagnosis not present

## 2020-12-18 DIAGNOSIS — H02423 Myogenic ptosis of bilateral eyelids: Secondary | ICD-10-CM | POA: Diagnosis not present

## 2020-12-18 DIAGNOSIS — H57813 Brow ptosis, bilateral: Secondary | ICD-10-CM | POA: Diagnosis not present

## 2020-12-18 DIAGNOSIS — H02834 Dermatochalasis of left upper eyelid: Secondary | ICD-10-CM | POA: Diagnosis not present

## 2020-12-18 DIAGNOSIS — H53483 Generalized contraction of visual field, bilateral: Secondary | ICD-10-CM | POA: Diagnosis not present

## 2020-12-31 ENCOUNTER — Other Ambulatory Visit: Payer: Self-pay | Admitting: Cardiology

## 2021-03-04 DIAGNOSIS — D2272 Melanocytic nevi of left lower limb, including hip: Secondary | ICD-10-CM | POA: Diagnosis not present

## 2021-03-04 DIAGNOSIS — L821 Other seborrheic keratosis: Secondary | ICD-10-CM | POA: Diagnosis not present

## 2021-03-04 DIAGNOSIS — L812 Freckles: Secondary | ICD-10-CM | POA: Diagnosis not present

## 2021-03-04 DIAGNOSIS — D2271 Melanocytic nevi of right lower limb, including hip: Secondary | ICD-10-CM | POA: Diagnosis not present

## 2021-03-04 DIAGNOSIS — D225 Melanocytic nevi of trunk: Secondary | ICD-10-CM | POA: Diagnosis not present

## 2021-03-04 DIAGNOSIS — D485 Neoplasm of uncertain behavior of skin: Secondary | ICD-10-CM | POA: Diagnosis not present

## 2021-03-04 DIAGNOSIS — Z85820 Personal history of malignant melanoma of skin: Secondary | ICD-10-CM | POA: Diagnosis not present

## 2021-03-04 DIAGNOSIS — L570 Actinic keratosis: Secondary | ICD-10-CM | POA: Diagnosis not present

## 2021-04-03 DIAGNOSIS — Z09 Encounter for follow-up examination after completed treatment for conditions other than malignant neoplasm: Secondary | ICD-10-CM | POA: Diagnosis not present

## 2021-04-03 DIAGNOSIS — H57813 Brow ptosis, bilateral: Secondary | ICD-10-CM | POA: Diagnosis not present

## 2021-05-27 DIAGNOSIS — E538 Deficiency of other specified B group vitamins: Secondary | ICD-10-CM | POA: Diagnosis not present

## 2021-05-27 DIAGNOSIS — E039 Hypothyroidism, unspecified: Secondary | ICD-10-CM | POA: Diagnosis not present

## 2021-05-27 DIAGNOSIS — E785 Hyperlipidemia, unspecified: Secondary | ICD-10-CM | POA: Diagnosis not present

## 2021-05-27 DIAGNOSIS — Z125 Encounter for screening for malignant neoplasm of prostate: Secondary | ICD-10-CM | POA: Diagnosis not present

## 2021-06-03 DIAGNOSIS — I2510 Atherosclerotic heart disease of native coronary artery without angina pectoris: Secondary | ICD-10-CM | POA: Diagnosis not present

## 2021-06-03 DIAGNOSIS — R82998 Other abnormal findings in urine: Secondary | ICD-10-CM | POA: Diagnosis not present

## 2021-06-03 DIAGNOSIS — Z1331 Encounter for screening for depression: Secondary | ICD-10-CM | POA: Diagnosis not present

## 2021-06-03 DIAGNOSIS — I252 Old myocardial infarction: Secondary | ICD-10-CM | POA: Diagnosis not present

## 2021-06-03 DIAGNOSIS — E785 Hyperlipidemia, unspecified: Secondary | ICD-10-CM | POA: Diagnosis not present

## 2021-06-03 DIAGNOSIS — R040 Epistaxis: Secondary | ICD-10-CM | POA: Diagnosis not present

## 2021-06-03 DIAGNOSIS — Z1339 Encounter for screening examination for other mental health and behavioral disorders: Secondary | ICD-10-CM | POA: Diagnosis not present

## 2021-06-03 DIAGNOSIS — E039 Hypothyroidism, unspecified: Secondary | ICD-10-CM | POA: Diagnosis not present

## 2021-06-03 DIAGNOSIS — R918 Other nonspecific abnormal finding of lung field: Secondary | ICD-10-CM | POA: Diagnosis not present

## 2021-06-03 DIAGNOSIS — N400 Enlarged prostate without lower urinary tract symptoms: Secondary | ICD-10-CM | POA: Diagnosis not present

## 2021-06-03 DIAGNOSIS — Z0000 Encounter for general adult medical examination without abnormal findings: Secondary | ICD-10-CM | POA: Diagnosis not present

## 2021-06-03 DIAGNOSIS — D692 Other nonthrombocytopenic purpura: Secondary | ICD-10-CM | POA: Diagnosis not present

## 2021-06-03 DIAGNOSIS — I700 Atherosclerosis of aorta: Secondary | ICD-10-CM | POA: Diagnosis not present

## 2021-07-04 DIAGNOSIS — S8002XA Contusion of left knee, initial encounter: Secondary | ICD-10-CM | POA: Diagnosis not present

## 2021-07-04 DIAGNOSIS — M1712 Unilateral primary osteoarthritis, left knee: Secondary | ICD-10-CM | POA: Diagnosis not present

## 2021-07-24 DIAGNOSIS — E039 Hypothyroidism, unspecified: Secondary | ICD-10-CM | POA: Diagnosis not present

## 2021-09-09 DIAGNOSIS — L812 Freckles: Secondary | ICD-10-CM | POA: Diagnosis not present

## 2021-09-09 DIAGNOSIS — L570 Actinic keratosis: Secondary | ICD-10-CM | POA: Diagnosis not present

## 2021-09-09 DIAGNOSIS — L853 Xerosis cutis: Secondary | ICD-10-CM | POA: Diagnosis not present

## 2021-09-09 DIAGNOSIS — L821 Other seborrheic keratosis: Secondary | ICD-10-CM | POA: Diagnosis not present

## 2021-09-09 DIAGNOSIS — Z85820 Personal history of malignant melanoma of skin: Secondary | ICD-10-CM | POA: Diagnosis not present

## 2021-09-23 ENCOUNTER — Other Ambulatory Visit: Payer: Self-pay | Admitting: Cardiology

## 2021-09-25 DIAGNOSIS — H524 Presbyopia: Secondary | ICD-10-CM | POA: Diagnosis not present

## 2021-09-25 DIAGNOSIS — H26493 Other secondary cataract, bilateral: Secondary | ICD-10-CM | POA: Diagnosis not present

## 2021-09-27 DIAGNOSIS — Z23 Encounter for immunization: Secondary | ICD-10-CM | POA: Diagnosis not present

## 2021-11-07 NOTE — Progress Notes (Signed)
Joseph Roman Date of Birth: 09-Apr-1930 Medical Record #[id number]  History of Present Illness: Joseph Roman is seen for yearly followup of CAD. He has a history of coronary disease and is status post inferior lateral myocardial infarction in June of 2009. He had stenting of the mid left circumflex coronary emergently with a 2.75 x 23 mm Promus stent. His last stress test in July of 2015 was without ischemia at excellent exercise level. He is s/p VATS and drainage of a left pleural effusion by Dr. Cyndia Bent in February 2018 for empyema with PNA. Follow up CT in June 2020 was stable.   On followup today he is doing very well. He is still working full time. Is now in an exercise program at [facility].   He denies any chest pain, SOB, palpitations, or dizziness. No edema. Energy level is good.  Did have Covid 53 a couple of weeks ago but states it was mild.   Current Outpatient Medications on File Prior to Visit  Medication Sig Dispense Refill   aspirin 81 MG tablet Take 81 mg by mouth daily.     nitroGLYCERIN (NITROSTAT) 0.4 MG SL tablet Place 1 tablet (0.4 mg total) under the tongue as needed. 25 tablet 3   pravastatin (PRAVACHOL) 10 MG tablet TAKE ONE TABLET BY MOUTH DAILY 90 tablet 2   vitamin B-12 (CYANOCOBALAMIN) 1000 MCG tablet Take 1,000 mcg by mouth daily.     SYNTHROID 125 MCG tablet Take 125 mcg by mouth daily.     No current facility-administered medications on file prior to visit.    No Known Allergies  Past Medical History:  Diagnosis Date   Acute inferolateral myocardial infarction [facility]) 04/2008   2.75x23 Promus Lcx   Arthritis    "comes and goes; hits everywhere" (12/29/2016)   Coronary artery disease    Dyslipidemia    Mild dyslipidemia   GERD (gastroesophageal reflux disease)    Hypothyroidism    Pneumonia 12/29/2016    Past Surgical History:  Procedure Laterality Date   CATARACT EXTRACTION W/ INTRAOCULAR LENS  IMPLANT, BILATERAL Bilateral 2010-2016    "right-left"   COLONOSCOPY W/ BIOPSIES AND POLYPECTOMY  04/21/2005   CORONARY ANGIOPLASTY WITH STENT PLACEMENT  05/07/2008   Ejection fraction was estimated at 60%   PLEURAL EFFUSION DRAINAGE Left 12/31/2016   Procedure: DRAINAGE OF PLEURAL EFFUSION/empyema;  Surgeon: Gaye Pollack, MD;  Location: [facility] OR;  Service: Thoracic;  Laterality: Left;   TONSILLECTOMY     VIDEO ASSISTED THORACOSCOPY Left 12/31/2016   Procedure: VIDEO ASSISTED THORACOSCOPY;  Surgeon: Gaye Pollack, MD;  Location: [facility] OR;  Service: Thoracic;  Laterality: Left;    Social History   Tobacco Use  Smoking Status Never  Smokeless Tobacco Never    Social History   Substance and Sexual Activity  Alcohol Use Yes   Alcohol/week: 1.0 standard drink   Types: 1 Cans of beer per week    Family History  Problem Relation Age of Onset   Cancer - Lung Father     Review of Systems: As noted in history of present illness.  All other systems were reviewed and are negative.  Physical Exam: BP 128/72 (BP Location: Left Arm, Patient Position: Sitting, Cuff Size: Normal)   Pulse (!) 51   Resp 20   Ht 5\' 10"  (1.778 m)   Wt 176 lb 9.6 oz (80.1 kg)   SpO2 97%   BMI 25.34 kg/m  GENERAL:  Well appearing WM in NAD HEENT:  PERRL,  EOMI, sclera are clear. Oropharynx is clear. NECK:  No jugular venous distention, carotid upstroke brisk and symmetric, no bruits, no thyromegaly or adenopathy LUNGS:  Clear to auscultation bilaterally CHEST:  Unremarkable HEART:  RRR,  PMI not displaced or sustained,S1 and S2 within normal limits, no S3, no S4: no clicks, no rubs, no murmurs ABD:  Soft, nontender. BS +, no masses or bruits. No hepatomegaly, no splenomegaly EXT:  2 + pulses throughout, no edema, no cyanosis no clubbing SKIN:  Warm and dry.  No rashes NEURO:  Alert and oriented x 3. Cranial nerves II through XII intact. PSYCH:  Cognitively intact   LABORATORY DATA: ECG today shows NSR rate 51 beats per minute. It is otherwise  normal. I have personally reviewed and interpreted this study.  Labs reviewed from Dr. Virgina Jock 04/12/15: Normal CBC, chemistry panel, TSH and PSA. Cholesterol- 117, trig-53, HDL-40, LDL 66. Dated 04/22/17: cholesterol 115, triglycerides 41, HDL 38, LDL 69. Hgb, CMET and TSH normal. Dated 04/28/18: cholesterol 115, triglycerides 66, HDL 41, LDL 61. CBC, CMET, TSH all normal.  Dated 05/15/19: cholesterol 126, triglycerides 61, HDL 40, LDL 74. CBC, CMET and TSH normal Dated 05/17/20: cholesterol 108, triglycerides 35, HDL 38, LDL 63, creatinine 1.1. plts 618K, TSH 7.4. free T4 110. Otherwise CMET and CBC normal.  Dated 05/27/21: cholesterol 119, triglycerides 51, HDL 39, LDL 70. Chemistries and TSH normal.  Assessment / Plan: 1. Coronary disease with remote myocardial infarction status post stenting of the left circumflex coronary in 2009 with a  DES. GXT negative in July 2015. Continue ASA and statin.  He remains asymptomatic.  2. Dyslipidemia-Last LDL was at goal.   Follow up in one year.

## 2021-11-11 ENCOUNTER — Other Ambulatory Visit: Payer: Self-pay

## 2021-11-11 ENCOUNTER — Encounter: Payer: Self-pay | Admitting: Cardiology

## 2021-11-11 ENCOUNTER — Ambulatory Visit (INDEPENDENT_AMBULATORY_CARE_PROVIDER_SITE_OTHER): Payer: Medicare Other | Admitting: Cardiology

## 2021-11-11 VITALS — BP 128/72 | HR 51 | Resp 20 | Ht 70.0 in | Wt 176.6 lb

## 2021-11-11 DIAGNOSIS — I2510 Atherosclerotic heart disease of native coronary artery without angina pectoris: Secondary | ICD-10-CM

## 2021-11-11 DIAGNOSIS — E7800 Pure hypercholesterolemia, unspecified: Secondary | ICD-10-CM

## 2021-12-02 ENCOUNTER — Ambulatory Visit: Payer: Medicare Other | Admitting: Cardiology

## 2022-03-10 DIAGNOSIS — D225 Melanocytic nevi of trunk: Secondary | ICD-10-CM | POA: Diagnosis not present

## 2022-03-10 DIAGNOSIS — L812 Freckles: Secondary | ICD-10-CM | POA: Diagnosis not present

## 2022-03-10 DIAGNOSIS — L570 Actinic keratosis: Secondary | ICD-10-CM | POA: Diagnosis not present

## 2022-03-10 DIAGNOSIS — Z85820 Personal history of malignant melanoma of skin: Secondary | ICD-10-CM | POA: Diagnosis not present

## 2022-03-10 DIAGNOSIS — L821 Other seborrheic keratosis: Secondary | ICD-10-CM | POA: Diagnosis not present

## 2022-06-18 ENCOUNTER — Other Ambulatory Visit: Payer: Self-pay | Admitting: Cardiology

## 2022-06-18 DIAGNOSIS — E785 Hyperlipidemia, unspecified: Secondary | ICD-10-CM | POA: Diagnosis not present

## 2022-06-18 DIAGNOSIS — E039 Hypothyroidism, unspecified: Secondary | ICD-10-CM | POA: Diagnosis not present

## 2022-06-18 DIAGNOSIS — E538 Deficiency of other specified B group vitamins: Secondary | ICD-10-CM | POA: Diagnosis not present

## 2022-06-18 DIAGNOSIS — R5383 Other fatigue: Secondary | ICD-10-CM | POA: Diagnosis not present

## 2022-06-18 DIAGNOSIS — R7989 Other specified abnormal findings of blood chemistry: Secondary | ICD-10-CM | POA: Diagnosis not present

## 2022-06-18 DIAGNOSIS — Z125 Encounter for screening for malignant neoplasm of prostate: Secondary | ICD-10-CM | POA: Diagnosis not present

## 2022-06-25 DIAGNOSIS — N400 Enlarged prostate without lower urinary tract symptoms: Secondary | ICD-10-CM | POA: Diagnosis not present

## 2022-06-25 DIAGNOSIS — E785 Hyperlipidemia, unspecified: Secondary | ICD-10-CM | POA: Diagnosis not present

## 2022-06-25 DIAGNOSIS — E538 Deficiency of other specified B group vitamins: Secondary | ICD-10-CM | POA: Diagnosis not present

## 2022-06-25 DIAGNOSIS — R82998 Other abnormal findings in urine: Secondary | ICD-10-CM | POA: Diagnosis not present

## 2022-06-25 DIAGNOSIS — Z0000 Encounter for general adult medical examination without abnormal findings: Secondary | ICD-10-CM | POA: Diagnosis not present

## 2022-06-25 DIAGNOSIS — D692 Other nonthrombocytopenic purpura: Secondary | ICD-10-CM | POA: Diagnosis not present

## 2022-06-25 DIAGNOSIS — R040 Epistaxis: Secondary | ICD-10-CM | POA: Diagnosis not present

## 2022-06-25 DIAGNOSIS — R918 Other nonspecific abnormal finding of lung field: Secondary | ICD-10-CM | POA: Diagnosis not present

## 2022-06-25 DIAGNOSIS — E039 Hypothyroidism, unspecified: Secondary | ICD-10-CM | POA: Diagnosis not present

## 2022-06-25 DIAGNOSIS — Z1331 Encounter for screening for depression: Secondary | ICD-10-CM | POA: Diagnosis not present

## 2022-06-25 DIAGNOSIS — D539 Nutritional anemia, unspecified: Secondary | ICD-10-CM | POA: Diagnosis not present

## 2022-09-08 DIAGNOSIS — Z85820 Personal history of malignant melanoma of skin: Secondary | ICD-10-CM | POA: Diagnosis not present

## 2022-09-08 DIAGNOSIS — L812 Freckles: Secondary | ICD-10-CM | POA: Diagnosis not present

## 2022-09-08 DIAGNOSIS — D1801 Hemangioma of skin and subcutaneous tissue: Secondary | ICD-10-CM | POA: Diagnosis not present

## 2022-09-08 DIAGNOSIS — L570 Actinic keratosis: Secondary | ICD-10-CM | POA: Diagnosis not present

## 2022-09-08 DIAGNOSIS — L821 Other seborrheic keratosis: Secondary | ICD-10-CM | POA: Diagnosis not present

## 2022-09-14 DIAGNOSIS — Z23 Encounter for immunization: Secondary | ICD-10-CM | POA: Diagnosis not present

## 2022-10-06 DIAGNOSIS — H5203 Hypermetropia, bilateral: Secondary | ICD-10-CM | POA: Diagnosis not present

## 2022-10-06 DIAGNOSIS — H40053 Ocular hypertension, bilateral: Secondary | ICD-10-CM | POA: Diagnosis not present

## 2023-01-15 DIAGNOSIS — H40051 Ocular hypertension, right eye: Secondary | ICD-10-CM | POA: Diagnosis not present

## 2023-01-19 NOTE — Progress Notes (Unsigned)
Joseph Roman Date of Birth: 14-Feb-1930 Medical Record [id number]  History of Present Illness: Joseph Roman is seen for yearly followup of CAD. He has a history of coronary disease and is status post inferior lateral myocardial infarction in June of 2009. He had stenting of the mid left circumflex coronary emergently with a 2.75 x 23 mm Promus stent. His last stress test in July of 2015 was without ischemia at excellent exercise level. He is s/p VATS and drainage of a left pleural effusion by Dr. Cyndia Bent in February 2018 for empyema with PNA. Follow up CT in June 2020 was stable.   On followup today he is doing very well. He is still working full time. Is now in an exercise program at [facility].   He denies any chest pain, SOB, palpitations, or dizziness. No edema. Energy level is good.  Did have Covid 51 a couple of weeks ago but states it was mild.   Current Outpatient Medications on File Prior to Visit  Medication Sig Dispense Refill   aspirin 81 MG tablet Take 81 mg by mouth daily.     nitroGLYCERIN (NITROSTAT) 0.4 MG SL tablet Place 1 tablet (0.4 mg total) under the tongue as needed. 25 tablet 3   pravastatin (PRAVACHOL) 10 MG tablet TAKE ONE TABLET BY MOUTH DAILY 90 tablet 2   SYNTHROID 125 MCG tablet Take 125 mcg by mouth daily.     vitamin B-12 (CYANOCOBALAMIN) 1000 MCG tablet Take 1,000 mcg by mouth daily.     No current facility-administered medications on file prior to visit.    No Known Allergies  Past Medical History:  Diagnosis Date   Acute inferolateral myocardial infarction [facility]) 04/2008   2.75x23 Promus Lcx   Arthritis    "comes and goes; hits everywhere" (12/29/2016)   Coronary artery disease    Dyslipidemia    Mild dyslipidemia   GERD (gastroesophageal reflux disease)    Hypothyroidism    Pneumonia 12/29/2016    Past Surgical History:  Procedure Laterality Date   CATARACT EXTRACTION W/ INTRAOCULAR LENS  IMPLANT, BILATERAL Bilateral 2010-2016    "right-left"   COLONOSCOPY W/ BIOPSIES AND POLYPECTOMY  04/21/2005   CORONARY ANGIOPLASTY WITH STENT PLACEMENT  05/07/2008   Ejection fraction was estimated at 60%   PLEURAL EFFUSION DRAINAGE Left 12/31/2016   Procedure: DRAINAGE OF PLEURAL EFFUSION/empyema;  Surgeon: Gaye Pollack, MD;  [facility];  Service: Thoracic;  Laterality: Left;   TONSILLECTOMY     VIDEO ASSISTED THORACOSCOPY Left 12/31/2016   Procedure: VIDEO ASSISTED THORACOSCOPY;  Surgeon: Gaye Pollack, MD;  Location: [facility] OR;  Service: Thoracic;  Laterality: Left;    Social History   Tobacco Use  Smoking Status Never  Smokeless Tobacco Never    Social History   Substance and Sexual Activity  Alcohol Use Yes   Alcohol/week: 1.0 standard drink of alcohol   Types: 1 Cans of beer per week    Family History  Problem Relation Age of Onset   Cancer - Lung Father     Review of Systems: As noted in history of present illness.  All other systems were reviewed and are negative.  Physical Exam: There were no vitals taken for this visit. GENERAL:  Well appearing WM in NAD HEENT:  PERRL, EOMI, sclera are clear. Oropharynx is clear. NECK:  No jugular venous distention, carotid upstroke brisk and symmetric, no bruits, no thyromegaly or adenopathy LUNGS:  Clear to auscultation bilaterally CHEST:  Unremarkable HEART:  RRR,  PMI not displaced or sustained,S1 and S2 within normal limits, no S3, no S4: no clicks, no rubs, no murmurs ABD:  Soft, nontender. BS +, no masses or bruits. No hepatomegaly, no splenomegaly EXT:  2 + pulses throughout, no edema, no cyanosis no clubbing SKIN:  Warm and dry.  No rashes NEURO:  Alert and oriented x 3. Cranial nerves II through XII intact. PSYCH:  Cognitively intact   LABORATORY DATA: ECG today shows NSR rate 51 beats per minute. It is otherwise normal. I have personally reviewed and interpreted this study.  Labs reviewed from Dr. Virgina Jock 04/12/15: Normal CBC, chemistry panel, TSH and  PSA. Cholesterol- 117, trig-53, HDL-40, LDL 66. Dated 04/22/17: cholesterol 115, triglycerides 41, HDL 38, LDL 69. Hgb, CMET and TSH normal. Dated 04/28/18: cholesterol 115, triglycerides 66, HDL 41, LDL 61. CBC, CMET, TSH all normal.  Dated 05/15/19: cholesterol 126, triglycerides 61, HDL 40, LDL 74. CBC, CMET and TSH normal Dated 05/17/20: cholesterol 108, triglycerides 35, HDL 38, LDL 63, creatinine 1.1. plts 618K, TSH 7.4. free T4 110. Otherwise CMET and CBC normal.  Dated 05/27/21: cholesterol 119, triglycerides 51, HDL 39, LDL 70. Chemistries and TSH normal. Dated 06/18/22: cholesterol 103, triglycerides 37, HDL 47, LDL 49. CMET and TSH normal.  Assessment / Plan: 1. Coronary disease with remote myocardial infarction status post stenting of the left circumflex coronary in 2009 with a  DES. GXT negative in July 2015. Continue ASA and statin.  He remains asymptomatic.  2. Dyslipidemia-Last LDL was at goal.   Follow up in one year.

## 2023-01-27 ENCOUNTER — Encounter: Payer: Self-pay | Admitting: Cardiology

## 2023-01-27 ENCOUNTER — Ambulatory Visit: Payer: Medicare Other | Attending: Cardiology | Admitting: Cardiology

## 2023-01-27 VITALS — BP 130/72 | HR 55 | Ht 70.5 in | Wt 179.0 lb

## 2023-01-27 DIAGNOSIS — I2510 Atherosclerotic heart disease of native coronary artery without angina pectoris: Secondary | ICD-10-CM | POA: Diagnosis not present

## 2023-01-27 DIAGNOSIS — E7800 Pure hypercholesterolemia, unspecified: Secondary | ICD-10-CM | POA: Insufficient documentation

## 2023-01-27 NOTE — Patient Instructions (Signed)
    Follow-Up: At [facility], you and your health needs are our priority.  As part of our continuing mission to provide you with exceptional heart care, we have created designated Provider Care Teams.  These Care Teams include your primary Cardiologist (physician) and Advanced Practice Providers (APPs -  Physician Assistants and Nurse Practitioners) who all work together to provide you with the care you need, when you need it.  We recommend signing up for the patient portal called "MyChart".  Sign up information is provided on this After Visit Summary.  MyChart is used to connect with patients for Virtual Visits (Telemedicine).  Patients are able to view lab/test results, encounter notes, upcoming appointments, etc.  Non-urgent messages can be sent to your provider as well.   To learn more about what you can do with MyChart, go to [email or web address].    Your next appointment:   12 month(s)  Provider:   Peter Martinique MD

## 2023-03-09 DIAGNOSIS — L821 Other seborrheic keratosis: Secondary | ICD-10-CM | POA: Diagnosis not present

## 2023-03-09 DIAGNOSIS — Z85820 Personal history of malignant melanoma of skin: Secondary | ICD-10-CM | POA: Diagnosis not present

## 2023-03-09 DIAGNOSIS — L570 Actinic keratosis: Secondary | ICD-10-CM | POA: Diagnosis not present

## 2023-03-09 DIAGNOSIS — D225 Melanocytic nevi of trunk: Secondary | ICD-10-CM | POA: Diagnosis not present

## 2023-03-09 DIAGNOSIS — L812 Freckles: Secondary | ICD-10-CM | POA: Diagnosis not present

## 2023-03-14 ENCOUNTER — Other Ambulatory Visit: Payer: Self-pay | Admitting: Cardiology

## 2023-06-22 DIAGNOSIS — I2510 Atherosclerotic heart disease of native coronary artery without angina pectoris: Secondary | ICD-10-CM | POA: Diagnosis not present

## 2023-06-22 DIAGNOSIS — E785 Hyperlipidemia, unspecified: Secondary | ICD-10-CM | POA: Diagnosis not present

## 2023-06-22 DIAGNOSIS — N400 Enlarged prostate without lower urinary tract symptoms: Secondary | ICD-10-CM | POA: Diagnosis not present

## 2023-06-22 DIAGNOSIS — D539 Nutritional anemia, unspecified: Secondary | ICD-10-CM | POA: Diagnosis not present

## 2023-06-22 DIAGNOSIS — E039 Hypothyroidism, unspecified: Secondary | ICD-10-CM | POA: Diagnosis not present

## 2023-06-22 DIAGNOSIS — D538 Other specified nutritional anemias: Secondary | ICD-10-CM | POA: Diagnosis not present

## 2023-06-29 DIAGNOSIS — I252 Old myocardial infarction: Secondary | ICD-10-CM | POA: Diagnosis not present

## 2023-06-29 DIAGNOSIS — D692 Other nonthrombocytopenic purpura: Secondary | ICD-10-CM | POA: Diagnosis not present

## 2023-06-29 DIAGNOSIS — E039 Hypothyroidism, unspecified: Secondary | ICD-10-CM | POA: Diagnosis not present

## 2023-06-29 DIAGNOSIS — Z23 Encounter for immunization: Secondary | ICD-10-CM | POA: Diagnosis not present

## 2023-06-29 DIAGNOSIS — Z1331 Encounter for screening for depression: Secondary | ICD-10-CM | POA: Diagnosis not present

## 2023-06-29 DIAGNOSIS — R82998 Other abnormal findings in urine: Secondary | ICD-10-CM | POA: Diagnosis not present

## 2023-06-29 DIAGNOSIS — I700 Atherosclerosis of aorta: Secondary | ICD-10-CM | POA: Diagnosis not present

## 2023-06-29 DIAGNOSIS — E785 Hyperlipidemia, unspecified: Secondary | ICD-10-CM | POA: Diagnosis not present

## 2023-06-29 DIAGNOSIS — Z0000 Encounter for general adult medical examination without abnormal findings: Secondary | ICD-10-CM | POA: Diagnosis not present

## 2023-06-29 DIAGNOSIS — E538 Deficiency of other specified B group vitamins: Secondary | ICD-10-CM | POA: Diagnosis not present

## 2023-06-29 DIAGNOSIS — R5383 Other fatigue: Secondary | ICD-10-CM | POA: Diagnosis not present

## 2023-06-29 DIAGNOSIS — I2510 Atherosclerotic heart disease of native coronary artery without angina pectoris: Secondary | ICD-10-CM | POA: Diagnosis not present

## 2023-06-29 DIAGNOSIS — Z1339 Encounter for screening examination for other mental health and behavioral disorders: Secondary | ICD-10-CM | POA: Diagnosis not present

## 2023-06-29 DIAGNOSIS — N400 Enlarged prostate without lower urinary tract symptoms: Secondary | ICD-10-CM | POA: Diagnosis not present

## 2023-07-09 DIAGNOSIS — H401411 Capsular glaucoma with pseudoexfoliation of lens, right eye, mild stage: Secondary | ICD-10-CM | POA: Diagnosis not present

## 2023-07-13 DIAGNOSIS — H401411 Capsular glaucoma with pseudoexfoliation of lens, right eye, mild stage: Secondary | ICD-10-CM | POA: Diagnosis not present

## 2023-07-20 DIAGNOSIS — H401411 Capsular glaucoma with pseudoexfoliation of lens, right eye, mild stage: Secondary | ICD-10-CM | POA: Diagnosis not present

## 2023-09-04 DIAGNOSIS — Z23 Encounter for immunization: Secondary | ICD-10-CM | POA: Diagnosis not present

## 2023-09-06 DIAGNOSIS — L570 Actinic keratosis: Secondary | ICD-10-CM | POA: Diagnosis not present

## 2023-09-06 DIAGNOSIS — L821 Other seborrheic keratosis: Secondary | ICD-10-CM | POA: Diagnosis not present

## 2023-09-06 DIAGNOSIS — Z85820 Personal history of malignant melanoma of skin: Secondary | ICD-10-CM | POA: Diagnosis not present

## 2023-09-06 DIAGNOSIS — L249 Irritant contact dermatitis, unspecified cause: Secondary | ICD-10-CM | POA: Diagnosis not present

## 2023-09-22 DIAGNOSIS — Z23 Encounter for immunization: Secondary | ICD-10-CM | POA: Diagnosis not present

## 2023-09-23 DIAGNOSIS — H02005 Unspecified entropion of left lower eyelid: Secondary | ICD-10-CM | POA: Diagnosis not present

## 2023-09-23 DIAGNOSIS — H401411 Capsular glaucoma with pseudoexfoliation of lens, right eye, mild stage: Secondary | ICD-10-CM | POA: Diagnosis not present

## 2023-10-04 DIAGNOSIS — H02005 Unspecified entropion of left lower eyelid: Secondary | ICD-10-CM | POA: Diagnosis not present

## 2023-11-11 DIAGNOSIS — H401431 Capsular glaucoma with pseudoexfoliation of lens, bilateral, mild stage: Secondary | ICD-10-CM | POA: Diagnosis not present

## 2024-01-21 NOTE — Progress Notes (Unsigned)
 Joseph Roman Date of Birth: [birth year] Medical Record #[id number]  History of Present Illness: Joseph Roman is seen for yearly followup of CAD. He has a history of coronary disease and is status post inferior lateral myocardial infarction in June of 2009. He had stenting of the mid left circumflex coronary emergently with a 2.75 x 23 mm Promus stent. His last stress test in July of 2015 was without ischemia at excellent exercise level. He is s/p VATS and drainage of a left pleural effusion by Dr. Laneta Simmers in February 2018 for empyema with PNA. Follow up CT in June 2020 was stable.   On followup today he is doing very well. He is still working full time.  He denies any chest pain, SOB, palpitations, or dizziness. No edema. Energy level is good.    Current Outpatient Medications on File Prior to Visit  Medication Sig Dispense Refill   aspirin 81 MG tablet Take 81 mg by mouth daily.     nitroGLYCERIN (NITROSTAT) 0.4 MG SL tablet Place 1 tablet (0.4 mg total) under the tongue as needed. 25 tablet 3   pravastatin (PRAVACHOL) 10 MG tablet TAKE 1 TABLET BY MOUTH DAILY 90 tablet 3   SYNTHROID 125 MCG tablet Take 125 mcg by mouth daily.     vitamin B-12 (CYANOCOBALAMIN) 1000 MCG tablet Take 1,000 mcg by mouth daily.     VITAMIN D PO Take 1 tablet by mouth daily.     No current facility-administered medications on file prior to visit.    No Known Allergies  Past Medical History:  Diagnosis Date   Acute inferolateral myocardial infarction [facility]) 04/2008   2.75x23 Promus Lcx   Arthritis    "comes and goes; hits everywhere" (12/29/2016)   Coronary artery disease    Dyslipidemia    Mild dyslipidemia   GERD (gastroesophageal reflux disease)    Hypothyroidism    Pneumonia 12/29/2016    Past Surgical History:  Procedure Laterality Date   CATARACT EXTRACTION W/ INTRAOCULAR LENS  IMPLANT, BILATERAL Bilateral 2010-2016   "right-left"   COLONOSCOPY W/ BIOPSIES AND POLYPECTOMY  04/21/2005    CORONARY ANGIOPLASTY WITH STENT PLACEMENT  05/07/2008   Ejection fraction was estimated at 60%   PLEURAL EFFUSION DRAINAGE Left 12/31/2016   Procedure: DRAINAGE OF PLEURAL EFFUSION/empyema;  Surgeon: Alleen Borne, MD;  Location: [facility] OR;  Service: Thoracic;  Laterality: Left;   TONSILLECTOMY     VIDEO ASSISTED THORACOSCOPY Left 12/31/2016   Procedure: VIDEO ASSISTED THORACOSCOPY;  Surgeon: Alleen Borne, MD;  Location: [facility] OR;  Service: Thoracic;  Laterality: Left;    Social History   Tobacco Use  Smoking Status Never  Smokeless Tobacco Never    Social History   Substance and Sexual Activity  Alcohol Use Yes   Alcohol/week: 1.0 standard drink of alcohol   Types: 1 Cans of beer per week    Family History  Problem Relation Age of Onset   Cancer - Lung Father     Review of Systems: As noted in history of present illness.  All other systems were reviewed and are negative.  Physical Exam: There were no vitals taken for this visit. GENERAL:  Well appearing WM in NAD HEENT:  PERRL, EOMI, sclera are clear. Oropharynx is clear. NECK:  No jugular venous distention, carotid upstroke brisk and symmetric, no bruits, no thyromegaly or adenopathy LUNGS:  Clear to auscultation bilaterally CHEST:  Unremarkable HEART:  RRR,  PMI not displaced or sustained,S1 and S2 within normal limits,  no S3, no S4: no clicks, no rubs, no murmurs ABD:  Soft, nontender. BS +, no masses or bruits. No hepatomegaly, no splenomegaly EXT:  2 + pulses throughout, no edema, no cyanosis no clubbing SKIN:  Warm and dry.  No rashes NEURO:  Alert and oriented x 3. Cranial nerves II through XII intact. PSYCH:  Cognitively intact   LABORATORY DATA: ECG today shows NSR rate 55 beats per minute. Occ. PACs. It is otherwise normal. I have personally reviewed and interpreted this study.  Labs reviewed from Dr. Timothy Lasso 04/12/15: Normal CBC, chemistry panel, TSH and PSA. Cholesterol- 117, trig-53, HDL-40, LDL 66. Dated  04/22/17: cholesterol 115, triglycerides 41, HDL 38, LDL 69. Hgb, CMET and TSH normal. Dated 04/28/18: cholesterol 115, triglycerides 66, HDL 41, LDL 61. CBC, CMET, TSH all normal.  Dated 05/15/19: cholesterol 126, triglycerides 61, HDL 40, LDL 74. CBC, CMET and TSH normal Dated 05/17/20: cholesterol 108, triglycerides 35, HDL 38, LDL 63, creatinine 1.1. plts 618K, TSH 7.4. free T4 110. Otherwise CMET and CBC normal.  Dated 05/27/21: cholesterol 119, triglycerides 51, HDL 39, LDL 70. Chemistries and TSH normal. Dated 06/18/22: cholesterol 103, triglycerides 37, HDL 47, LDL 49. CMET and TSH normal.  Dated 06/22/23: cholesterol 126, triglycerides 73, HDL 33, LDL 78, CMET normal  Assessment / Plan: 1. Coronary disease with remote myocardial infarction status post stenting of the left circumflex coronary in 2009 with a  DES. GXT negative in July 2015. Continue ASA and statin.  He remains asymptomatic.  2. Dyslipidemia-Last LDL was at goal.   Follow up in one year.

## 2024-01-26 ENCOUNTER — Encounter: Payer: Self-pay | Admitting: Cardiology

## 2024-01-26 ENCOUNTER — Ambulatory Visit: Payer: Medicare Other | Attending: Cardiology | Admitting: Cardiology

## 2024-01-26 VITALS — BP 144/70 | HR 48 | Ht 70.0 in | Wt 178.4 lb

## 2024-01-26 DIAGNOSIS — E7800 Pure hypercholesterolemia, unspecified: Secondary | ICD-10-CM

## 2024-01-26 DIAGNOSIS — R001 Bradycardia, unspecified: Secondary | ICD-10-CM

## 2024-01-26 DIAGNOSIS — Z136 Encounter for screening for cardiovascular disorders: Secondary | ICD-10-CM | POA: Diagnosis not present

## 2024-01-26 DIAGNOSIS — I2510 Atherosclerotic heart disease of native coronary artery without angina pectoris: Secondary | ICD-10-CM

## 2024-01-26 NOTE — Patient Instructions (Signed)
 Medication Instructions:  Continue same medications *If you need a refill on your cardiac medications before your next appointment, please call your pharmacy*   Lab Work: None ordered   Testing/Procedures: None ordered   Follow-Up: At [facility], you and your health needs are our priority.  As part of our continuing mission to provide you with exceptional heart care, we have created designated Provider Care Teams.  These Care Teams include your primary Cardiologist (physician) and Advanced Practice Providers (APPs -  Physician Assistants and Nurse Practitioners) who all work together to provide you with the care you need, when you need it.  We recommend signing up for the patient portal called "MyChart".  Sign up information is provided on this After Visit Summary.  MyChart is used to connect with patients for Virtual Visits (Telemedicine).  Patients are able to view lab/test results, encounter notes, upcoming appointments, etc.  Non-urgent messages can be sent to your provider as well.   To learn more about what you can do with MyChart, go to [email or web address].    Your next appointment:  1 year   Call in Nov to schedule Feb appointment     Provider:  Dr.Jordan

## 2024-03-07 DIAGNOSIS — H04123 Dry eye syndrome of bilateral lacrimal glands: Secondary | ICD-10-CM | POA: Diagnosis not present

## 2024-03-07 DIAGNOSIS — L812 Freckles: Secondary | ICD-10-CM | POA: Diagnosis not present

## 2024-03-07 DIAGNOSIS — D2271 Melanocytic nevi of right lower limb, including hip: Secondary | ICD-10-CM | POA: Diagnosis not present

## 2024-03-07 DIAGNOSIS — H401411 Capsular glaucoma with pseudoexfoliation of lens, right eye, mild stage: Secondary | ICD-10-CM | POA: Diagnosis not present

## 2024-03-07 DIAGNOSIS — L570 Actinic keratosis: Secondary | ICD-10-CM | POA: Diagnosis not present

## 2024-03-07 DIAGNOSIS — L821 Other seborrheic keratosis: Secondary | ICD-10-CM | POA: Diagnosis not present

## 2024-03-07 DIAGNOSIS — Z85820 Personal history of malignant melanoma of skin: Secondary | ICD-10-CM | POA: Diagnosis not present

## 2024-03-08 ENCOUNTER — Other Ambulatory Visit: Payer: Self-pay | Admitting: Cardiology

## 2024-06-12 ENCOUNTER — Other Ambulatory Visit: Payer: Self-pay | Admitting: Nurse Practitioner

## 2024-06-12 DIAGNOSIS — J189 Pneumonia, unspecified organism: Secondary | ICD-10-CM | POA: Diagnosis not present

## 2024-06-12 DIAGNOSIS — J90 Pleural effusion, not elsewhere classified: Secondary | ICD-10-CM

## 2024-06-12 DIAGNOSIS — Z86006 Personal history of melanoma in-situ: Secondary | ICD-10-CM | POA: Diagnosis not present

## 2024-06-12 DIAGNOSIS — R030 Elevated blood-pressure reading, without diagnosis of hypertension: Secondary | ICD-10-CM | POA: Diagnosis not present

## 2024-06-12 DIAGNOSIS — I252 Old myocardial infarction: Secondary | ICD-10-CM | POA: Diagnosis not present

## 2024-06-12 DIAGNOSIS — D239 Other benign neoplasm of skin, unspecified: Secondary | ICD-10-CM | POA: Diagnosis not present

## 2024-06-12 DIAGNOSIS — R0600 Dyspnea, unspecified: Secondary | ICD-10-CM | POA: Diagnosis not present

## 2024-06-12 DIAGNOSIS — R5383 Other fatigue: Secondary | ICD-10-CM | POA: Diagnosis not present

## 2024-06-13 ENCOUNTER — Inpatient Hospital Stay
Admission: RE | Admit: 2024-06-13 | Discharge: 2024-06-13 | Discharge status: Home / Self Care | Source: Ambulatory Visit | Attending: Nurse Practitioner

## 2024-06-13 DIAGNOSIS — I517 Cardiomegaly: Secondary | ICD-10-CM | POA: Diagnosis not present

## 2024-06-13 DIAGNOSIS — J90 Pleural effusion, not elsewhere classified: Secondary | ICD-10-CM | POA: Diagnosis not present

## 2024-06-13 DIAGNOSIS — R918 Other nonspecific abnormal finding of lung field: Secondary | ICD-10-CM | POA: Diagnosis not present

## 2024-06-13 DIAGNOSIS — R0600 Dyspnea, unspecified: Secondary | ICD-10-CM

## 2024-06-13 DIAGNOSIS — J189 Pneumonia, unspecified organism: Secondary | ICD-10-CM

## 2024-06-13 MED ORDER — IOPAMIDOL (ISOVUE-300) INJECTION 61%
75.0000 mL | Freq: Once | INTRAVENOUS | Status: AC | PRN
  Administered 2024-06-13: 75 mL via INTRAVENOUS

## 2024-06-22 DIAGNOSIS — R5383 Other fatigue: Secondary | ICD-10-CM | POA: Diagnosis not present

## 2024-06-22 DIAGNOSIS — J189 Pneumonia, unspecified organism: Secondary | ICD-10-CM | POA: Diagnosis not present

## 2024-06-22 DIAGNOSIS — R0600 Dyspnea, unspecified: Secondary | ICD-10-CM | POA: Diagnosis not present

## 2024-06-22 DIAGNOSIS — R748 Abnormal levels of other serum enzymes: Secondary | ICD-10-CM | POA: Diagnosis not present

## 2024-06-22 DIAGNOSIS — I4891 Unspecified atrial fibrillation: Secondary | ICD-10-CM | POA: Diagnosis not present

## 2024-06-22 DIAGNOSIS — J90 Pleural effusion, not elsewhere classified: Secondary | ICD-10-CM | POA: Diagnosis not present

## 2024-06-28 ENCOUNTER — Ambulatory Visit (HOSPITAL_COMMUNITY)
Admission: RE | Admit: 2024-06-28 | Discharge: 2024-06-28 | Disposition: A | Discharge status: Home / Self Care | Source: Ambulatory Visit | Attending: Internal Medicine | Admitting: Internal Medicine

## 2024-06-28 ENCOUNTER — Other Ambulatory Visit (HOSPITAL_COMMUNITY): Payer: Self-pay

## 2024-06-28 VITALS — BP 110/60 | HR 69 | Ht 70.0 in | Wt 163.2 lb

## 2024-06-28 DIAGNOSIS — I4891 Unspecified atrial fibrillation: Secondary | ICD-10-CM | POA: Diagnosis not present

## 2024-06-28 DIAGNOSIS — D6869 Other thrombophilia: Secondary | ICD-10-CM | POA: Diagnosis not present

## 2024-06-28 DIAGNOSIS — I4819 Other persistent atrial fibrillation: Secondary | ICD-10-CM

## 2024-06-28 DIAGNOSIS — I480 Paroxysmal atrial fibrillation: Secondary | ICD-10-CM | POA: Diagnosis not present

## 2024-06-28 NOTE — Patient Instructions (Signed)
 [facility] mobile 1-lead  Echocardiogram -- scheduling will call once insurance authorization received.

## 2024-06-28 NOTE — Progress Notes (Signed)
 Primary Care Physician: Onita Rush, MD Primary Cardiologist: Peter Swaziland, MD Electrophysiologist: None     Referring Physician: Sammi Laymon Braver, NP     Joseph Roman is a 89 y.o. male with a history of CAD, MI 04/2008 s/p stent, s/p VATS due to left pleural effusion 12/2016 secondary to CAP, dyslipidemia, and atrial fibrillation who presents for consultation in the [facility]. Patient seen by PCP on 06/22/24 for pneumonia not improving and found to be in rate controlled Afib. CT chest on 06/13/24 showed small bilateral pleural effusions and multifocal pneumonia. Patient is on Eliquis 5 mg BID for a CHADS2VASC score of 3.  On evaluation today, he is currently in Afib. He feels tired and SOB currently. He does note to feel improvement overall since pneumonia diagnosis. He stopped ASA and began Eliquis 5 mg BID on 7/25.   Today, he denies symptoms of palpitations, chest pain, orthopnea, PND, lower extremity edema, dizziness, presyncope, syncope, snoring, daytime somnolence, bleeding, or neurologic sequela. The patient is tolerating medications without difficulties and is otherwise without complaint today.   he has a BMI of Body mass index is 23.42 kg/m.SABRA Filed Weights   06/28/24 1449  Weight: 74 kg    Current Outpatient Medications  Medication Sig Dispense Refill   apixaban (ELIQUIS) 5 MG TABS tablet Take 5 mg by mouth 2 (two) times daily.     dorzolamide-timolol (COSOPT) 2-0.5 % ophthalmic solution Place 1 drop into the right eye in the morning and at bedtime.     furosemide (LASIX) 40 MG tablet Take 40 mg by mouth daily.     nitroGLYCERIN  (NITROSTAT ) 0.4 MG SL tablet Place 1 tablet (0.4 mg total) under the tongue as needed. 25 tablet 3   pravastatin  (PRAVACHOL ) 10 MG tablet TAKE 1 TABLET BY MOUTH DAILY 90 tablet 3   SYNTHROID  125 MCG tablet Take 125 mcg by mouth daily.     vitamin B-12 (CYANOCOBALAMIN ) 1000 MCG tablet Take 1,000 mcg by  mouth daily.     VITAMIN D PO Take 1 tablet by mouth daily.     No current facility-administered medications for this encounter.    Atrial Fibrillation Management history:  Previous antiarrhythmic drugs: none Previous cardioversions: none Previous ablations: none Anticoagulation history: Eliquis   ROS- All systems are reviewed and negative except as per the HPI above.  Physical Exam: BP 110/60   Pulse 69   Ht 5' 10 (1.778 m)   Wt 74 kg   BMI 23.42 kg/m   GEN: Well nourished, well developed in no acute distress NECK: No JVD; No carotid bruits CARDIAC: Irregularly irregular rate and rhythm, no murmurs, rubs, gallops RESPIRATORY:  Clear to auscultation without rales, wheezing or rhonchi  ABDOMEN: Soft, non-tender, non-distended EXTREMITIES:  No edema; No deformity   EKG today demonstrates  Vent. rate 69 BPM PR interval * ms QRS duration 74 ms QT/QTcB 372/398 ms P-R-T axes * 30 57 Atrial fibrillation Abnormal ECG When compared with ECG of 26-Jan-2024 09:19, No significant change was found  Echo N/A  ASSESSMENT & PLAN CHA2DS2-VASc Score = 3  The patient's score is based upon: CHF History: 0 HTN History: 0 Diabetes History: 0 Stroke History: 0 Vascular Disease History: 1 Age Score: 2 Gender Score: 0       ASSESSMENT AND PLAN: Persistent Atrial Fibrillation (ICD10:  I48.19) The patient's CHA2DS2-VASc score is 3, indicating a 3.2% annual risk of stroke.    He is currently in Afib. He  does not feel well and would like to go back into normal rhythm. Education provided about Afib. We discussed the cardioversion procedure. Discussion about medication treatments going forward if indicated. After discussion, we will proceed with follow up in 3 weeks and if still in Afib proceed with scheduling cardioversion. Will order baseline echocardiogram. Rhythm monitoring device recommended.    Secondary Hypercoagulable State (ICD10:  D68.69) The patient is at significant  risk for stroke/thromboembolism based upon his CHA2DS2-VASc Score of 3.  Continue Apixaban (Eliquis).  Continue Eliquis 5 mg BID.    Follow up 3 weeks.    Terra Pac, PA-C  Afib Clinic [facility] [address] [place], [place] [address] [phone]

## 2024-07-04 DIAGNOSIS — J90 Pleural effusion, not elsewhere classified: Secondary | ICD-10-CM | POA: Diagnosis not present

## 2024-07-04 DIAGNOSIS — Z7901 Long term (current) use of anticoagulants: Secondary | ICD-10-CM | POA: Diagnosis not present

## 2024-07-04 DIAGNOSIS — R5383 Other fatigue: Secondary | ICD-10-CM | POA: Diagnosis not present

## 2024-07-04 DIAGNOSIS — Z1331 Encounter for screening for depression: Secondary | ICD-10-CM | POA: Diagnosis not present

## 2024-07-04 DIAGNOSIS — R0600 Dyspnea, unspecified: Secondary | ICD-10-CM | POA: Diagnosis not present

## 2024-07-04 DIAGNOSIS — Z1339 Encounter for screening examination for other mental health and behavioral disorders: Secondary | ICD-10-CM | POA: Diagnosis not present

## 2024-07-04 DIAGNOSIS — I517 Cardiomegaly: Secondary | ICD-10-CM | POA: Diagnosis not present

## 2024-07-04 DIAGNOSIS — I4891 Unspecified atrial fibrillation: Secondary | ICD-10-CM | POA: Diagnosis not present

## 2024-07-04 DIAGNOSIS — Z0000 Encounter for general adult medical examination without abnormal findings: Secondary | ICD-10-CM | POA: Diagnosis not present

## 2024-07-04 DIAGNOSIS — R911 Solitary pulmonary nodule: Secondary | ICD-10-CM | POA: Diagnosis not present

## 2024-07-04 DIAGNOSIS — J189 Pneumonia, unspecified organism: Secondary | ICD-10-CM | POA: Diagnosis not present

## 2024-07-05 DIAGNOSIS — D692 Other nonthrombocytopenic purpura: Secondary | ICD-10-CM | POA: Diagnosis not present

## 2024-07-05 DIAGNOSIS — I4891 Unspecified atrial fibrillation: Secondary | ICD-10-CM | POA: Diagnosis not present

## 2024-07-05 DIAGNOSIS — J189 Pneumonia, unspecified organism: Secondary | ICD-10-CM | POA: Diagnosis not present

## 2024-07-06 ENCOUNTER — Other Ambulatory Visit: Payer: Self-pay

## 2024-07-06 ENCOUNTER — Inpatient Hospital Stay: Attending: Hematology & Oncology

## 2024-07-06 ENCOUNTER — Inpatient Hospital Stay: Admitting: Hematology & Oncology

## 2024-07-06 ENCOUNTER — Inpatient Hospital Stay

## 2024-07-06 ENCOUNTER — Encounter: Payer: Self-pay | Admitting: Hematology & Oncology

## 2024-07-06 VITALS — BP 116/67 | HR 69 | Temp 97.7°F | Resp 18 | Ht 70.0 in | Wt 162.0 lb

## 2024-07-06 DIAGNOSIS — Z8701 Personal history of pneumonia (recurrent): Secondary | ICD-10-CM | POA: Insufficient documentation

## 2024-07-06 DIAGNOSIS — Z79899 Other long term (current) drug therapy: Secondary | ICD-10-CM | POA: Diagnosis not present

## 2024-07-06 DIAGNOSIS — Z0000 Encounter for general adult medical examination without abnormal findings: Secondary | ICD-10-CM | POA: Insufficient documentation

## 2024-07-06 DIAGNOSIS — D696 Thrombocytopenia, unspecified: Secondary | ICD-10-CM

## 2024-07-06 DIAGNOSIS — D693 Immune thrombocytopenic purpura: Secondary | ICD-10-CM | POA: Insufficient documentation

## 2024-07-06 DIAGNOSIS — H40003 Preglaucoma, unspecified, bilateral: Secondary | ICD-10-CM | POA: Insufficient documentation

## 2024-07-06 DIAGNOSIS — I4891 Unspecified atrial fibrillation: Secondary | ICD-10-CM

## 2024-07-06 DIAGNOSIS — D75839 Thrombocytosis, unspecified: Secondary | ICD-10-CM | POA: Diagnosis not present

## 2024-07-06 DIAGNOSIS — Z7901 Long term (current) use of anticoagulants: Secondary | ICD-10-CM

## 2024-07-06 DIAGNOSIS — Z9189 Other specified personal risk factors, not elsewhere classified: Secondary | ICD-10-CM | POA: Insufficient documentation

## 2024-07-06 DIAGNOSIS — Z7952 Long term (current) use of systemic steroids: Secondary | ICD-10-CM | POA: Insufficient documentation

## 2024-07-06 DIAGNOSIS — Z801 Family history of malignant neoplasm of trachea, bronchus and lung: Secondary | ICD-10-CM | POA: Insufficient documentation

## 2024-07-06 DIAGNOSIS — Z8616 Personal history of COVID-19: Secondary | ICD-10-CM | POA: Insufficient documentation

## 2024-07-06 DIAGNOSIS — R911 Solitary pulmonary nodule: Secondary | ICD-10-CM | POA: Insufficient documentation

## 2024-07-06 DIAGNOSIS — R918 Other nonspecific abnormal finding of lung field: Secondary | ICD-10-CM | POA: Insufficient documentation

## 2024-07-06 DIAGNOSIS — R627 Adult failure to thrive: Secondary | ICD-10-CM | POA: Insufficient documentation

## 2024-07-06 DIAGNOSIS — R040 Epistaxis: Secondary | ICD-10-CM | POA: Insufficient documentation

## 2024-07-06 LAB — CMP (CANCER CENTER ONLY)
ALT: 45 U/L — ABNORMAL HIGH (ref 0–44)
AST: 34 U/L (ref 15–41)
Albumin: 3.5 g/dL (ref 3.5–5.0)
Alkaline Phosphatase: 90 U/L (ref 38–126)
Anion gap: 10 (ref 5–15)
BUN: 19 mg/dL (ref 8–23)
CO2: 26 mmol/L (ref 22–32)
Calcium: 9 mg/dL (ref 8.9–10.3)
Chloride: 99 mmol/L (ref 98–111)
Creatinine: 1 mg/dL (ref 0.61–1.24)
GFR, Estimated: >60 mL/min (ref >60)
Glucose, Bld: 127 mg/dL — ABNORMAL HIGH (ref 70–99)
Potassium: 4.5 mmol/L (ref 3.5–5.1)
Sodium: 136 mmol/L (ref 135–145)
Total Bilirubin: 0.9 mg/dL (ref 0.0–1.2)
Total Protein: 6.8 g/dL (ref 6.5–8.1)

## 2024-07-06 LAB — LACTATE DEHYDROGENASE: LDH: 208 U/L — ABNORMAL HIGH (ref 98–192)

## 2024-07-06 LAB — CBC WITH DIFFERENTIAL (CANCER CENTER ONLY)
Abs Immature Granulocytes: 0.06 K/uL (ref 0.00–0.07)
Basophils Absolute: 0.1 K/uL (ref 0.0–0.1)
Basophils Relative: 1 %
Eosinophils Absolute: 0.4 K/uL (ref 0.0–0.5)
Eosinophils Relative: 4 %
HCT: 34.8 % — ABNORMAL LOW (ref 39.0–52.0)
Hemoglobin: 11.4 g/dL — ABNORMAL LOW (ref 13.0–17.0)
Immature Granulocytes: 1 %
Lymphocytes Relative: 13 %
Lymphs Abs: 1.2 K/uL (ref 0.7–4.0)
MCH: 31.7 pg (ref 26.0–34.0)
MCHC: 32.8 g/dL (ref 30.0–36.0)
MCV: 96.7 fL (ref 80.0–100.0)
Monocytes Absolute: 1 K/uL (ref 0.1–1.0)
Monocytes Relative: 10 %
Neutro Abs: 6.9 K/uL (ref 1.7–7.7)
Neutrophils Relative %: 71 %
Platelet Count: 11 K/uL — ABNORMAL LOW (ref 150–400)
RBC: 3.6 MIL/uL — ABNORMAL LOW (ref 4.22–5.81)
RDW: 15.3 % (ref 11.5–15.5)
WBC Count: 9.6 K/uL (ref 4.0–10.5)
nRBC: 0 % (ref 0.0–0.2)

## 2024-07-06 LAB — VITAMIN B12: Vitamin B-12: 1392 pg/mL — ABNORMAL HIGH (ref 180–914)

## 2024-07-06 LAB — SAVE SMEAR(SSMR), FOR PROVIDER SLIDE REVIEW

## 2024-07-06 MED ORDER — PANTOPRAZOLE SODIUM 40 MG PO TBEC: 40.0000 mg | DELAYED_RELEASE_TABLET | Freq: Two times a day (BID) | ORAL | 8 refills | Status: DC

## 2024-07-06 MED ORDER — PREDNISONE 20 MG PO TABS: 80.0000 mg | ORAL_TABLET | Freq: Every day | ORAL | 4 refills | Status: DC

## 2024-07-06 MED ORDER — FAMCICLOVIR 250 MG PO TABS: 250.0000 mg | ORAL_TABLET | Freq: Every day | ORAL | 6 refills | Status: DC

## 2024-07-06 MED ORDER — NYSTATIN 100000 UNIT/ML MT SUSP: 5.0000 mL | Freq: Four times a day (QID) | OROMUCOSAL | 8 refills | Status: DC

## 2024-07-06 MED ORDER — ROMIPLOSTIM 250 MCG ~~LOC~~ SOLR
2.0000 ug/kg | Freq: Once | SUBCUTANEOUS | Status: AC
  Administered 2024-07-06: 145 ug via SUBCUTANEOUS
  Filled 2024-07-06: qty 0.29

## 2024-07-06 NOTE — Progress Notes (Signed)
 Referral MD  Reason for Referral: Profound thrombocytopenia  Chief Complaint  Patient presents with   New Patient (Initial Visit)  : My platelets are low.  HPI: [name] is a very nice 89 year old white male.  He certainly does not look his age.  I must give him so much credit for being in such great shape.  He is originally from [place] .  We had nice talk about [place].  He had see lived in [place] .  Again we had a nice talk about this.  He is followed by Dr. Norleen Jungling.  Dr. Jungling, been incredibly thorough, has noted that the platelet count has been dropping significantly.  When I went through the Epic system, lab work that I saw him back in 2018 all showed some thrombocytosis.  This was him when he apparently had pneumonia.  Back in July of this year, he had pneumonia.  He had a CT scan that was done.  This was done on 06/13/2024.  This showed small bilateral effusions.  He had bilateral lower lobe consolidation.  He had some nodular density in the right upper lobe.  There were also some scattered pulmonary nodules that were quite small.  He then developed atrial fibrillation.  He is now on Eliquis.  Apparently, the platelet count has been dropping quickly.  From the lab work that Dr. Jungling had commented on, his platelet count has gone down to 16,000.  Joseph Roman is now off the Eliquis.  He has had no bruising.  He has had no bleeding.  He never smoked.  He has a rare alcohol  indulgence.  As far as he knows, there is no history of blood problems in the family.  He has had past surgery without any difficulty.  There has been no change in bowel or bladder habits.  He really has had no weight loss of significance.  There is been no tingling in the hands or feet.  He has had no rashes.  He has not noted any swollen lymph nodes.  Overall, I would have said that his performance status is probably ECOG 0.    Past Medical History:  Diagnosis Date   Acute  inferolateral myocardial infarction [facility]) 04/2008   2.75x23 Promus Lcx   Arthritis    comes and goes; hits everywhere (12/29/2016)   Chronic ITP (idiopathic thrombocytopenia) (HCC) 07/06/2024   Coronary artery disease    Dyslipidemia    Mild dyslipidemia   GERD (gastroesophageal reflux disease)    Hypothyroidism    Pneumonia 12/29/2016  :   Past Surgical History:  Procedure Laterality Date   CATARACT EXTRACTION W/ INTRAOCULAR LENS  IMPLANT, BILATERAL Bilateral 2010-2016   right-left   COLONOSCOPY W/ BIOPSIES AND POLYPECTOMY  04/21/2005   CORONARY ANGIOPLASTY WITH STENT PLACEMENT  05/07/2008   Ejection fraction was estimated at 60%   PLEURAL EFFUSION DRAINAGE Left 12/31/2016   Procedure: DRAINAGE OF PLEURAL EFFUSION/empyema;  Surgeon: Dorise MARLA Fellers, MD;  Location: [facility] OR;  Service: Thoracic;  Laterality: Left;   TONSILLECTOMY     VIDEO ASSISTED THORACOSCOPY Left 12/31/2016   Procedure: VIDEO ASSISTED THORACOSCOPY;  Surgeon: Dorise MARLA Fellers, MD;  Location: [facility] OR;  Service: Thoracic;  Laterality: Left;  :   Current Outpatient Medications:    furosemide (LASIX) 40 MG tablet, Take 40 mg by mouth daily. (Patient taking differently: Take 20 mg by mouth daily.), Disp: , Rfl:    dorzolamide-timolol (COSOPT) 2-0.5 % ophthalmic solution, Place 1 drop into the right eye  in the morning and at bedtime., Disp: , Rfl:    nitroGLYCERIN  (NITROSTAT ) 0.4 MG SL tablet, Place 1 tablet (0.4 mg total) under the tongue as needed., Disp: 25 tablet, Rfl: 3   pravastatin  (PRAVACHOL ) 10 MG tablet, TAKE 1 TABLET BY MOUTH DAILY, Disp: 90 tablet, Rfl: 3   SYNTHROID  125 MCG tablet, Take 125 mcg by mouth daily., Disp: , Rfl:    vitamin B-12 (CYANOCOBALAMIN ) 1000 MCG tablet, Take 1,000 mcg by mouth daily., Disp: , Rfl:    VITAMIN D PO, Take 1 tablet by mouth daily., Disp: , Rfl:  No current facility-administered medications for this visit.  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    Employer: [employer]  Tobacco Use   Smoking status: Never   Smokeless tobacco: Never  Substance and Sexual Activity   Alcohol  use: Yes    Alcohol /week: 1.0 standard drink of alcohol     Types: 1 Cans of beer per week   Drug use: No   Sexual activity: Yes  Other Topics Concern   Not on file  Social History Narrative   Not on file   Social Drivers of Health   Financial Resource Strain: Not on file  Food Insecurity: No Food Insecurity (07/06/2024)   Hunger Vital Sign    Worried About Running Out of Food in the Last Year: Never true    Ran Out of Food in the Last Year: Never true  Transportation Needs: No Transportation Needs (07/06/2024)   PRAPARE - Administrator, Civil Service (Medical): No    Lack of Transportation (Non-Medical): No  Physical Activity: Not on file  Stress: Not on file  Social Connections: Not on file  Intimate Partner Violence: Not At Risk (07/06/2024)   Humiliation, Afraid, Rape, and Kick questionnaire    Fear of Current or Ex-Partner: No    Emotionally Abused: No    Physically Abused: No    Sexually Abused: No  :  Review of Systems  Constitutional: Negative.   HENT: Negative.    Eyes: Negative.   Respiratory: Negative.    Cardiovascular: Negative.   Gastrointestinal: Negative.   Genitourinary: Negative.   Musculoskeletal: Negative.   Skin: Negative.   Neurological: Negative.   Endo/Heme/Allergies: Negative.   Psychiatric/Behavioral:  Negative.       Exam:  His vital signs show temperature 97.7.  Pulse 69.  Blood pressure 116/67.  Weight is 162 pounds.  @IPVITALS @ Physical Exam Vitals reviewed.  Constitutional:      Comments: General, this is a well-developed well-nourished white gentleman in no obvious distress.  There is no adenopathy that I can palpate.  HENT:     Head: Normocephalic and atraumatic.  Eyes:     Pupils: Pupils are equal, round, and reactive to light.  Cardiovascular:     Rate and Rhythm: Normal rate and regular rhythm.  Heart sounds: Normal heart sounds.     Comments: Cardiac exam is regular rate and rhythm.  He does have occasional extra beat.  The atrial fibrillation that he has certainly is under very well control. Pulmonary:     Effort: Pulmonary effort is normal.     Breath sounds: Normal breath sounds.  Abdominal:     General: Bowel sounds are normal.     Palpations: Abdomen is soft.     Comments: His abdominal exam is soft.  The he has good bowel sounds.  There is no fluid wave.  There is no palpable abdominal mass.  There is no palpable liver or spleen tip.  Musculoskeletal:        General: No tenderness or deformity. Normal range of motion.     Cervical back: Normal range of motion.  Lymphadenopathy:     Cervical: No cervical adenopathy.  Skin:    General: Skin is warm and dry.     Findings: No erythema or rash.     Comments: Skin exam does not show any petechia or ecchymoses.  Neurological:     Mental Status: He is alert and oriented to person, place, and time.  Psychiatric:        Behavior: Behavior normal.        Thought Content: Thought content normal.        Judgment: Judgment normal.      Recent Labs    07/06/24 1047  WBC 9.6  HGB 11.4*  HCT 34.8*  PLT 11*    Recent Labs    07/06/24 1047  NA 136  K 4.5  CL 99  CO2 26  GLUCOSE 127*  BUN 19  CREATININE 1.00  CALCIUM  9.0    Blood smear review: Normochromic and normocytic population of red blood  cells.  There are no nucleated red blood cells.  I see no teardrop cells.  There are no schistocytes.  There are no target cells.  I see no spherocytes.  He has no rouleaux formation.  White blood cells appear normal in morphology and maturation.  There is no immature myeloid or lymphoid forms.  I see no hyper segmented polys.  I do not see any lymphoblasts or myeloblasts.  Platelets are markedly decreased in number.  He has several large platelets.  Platelets are well granulated.  Pathology: None    Assessment and Plan: Mr. Yusuf is a very charming 89 year old white male.  I forgot to mention that he was in the [employer].  He served our country.  As such, he is a true American hero.  He has progressive thrombocytopenia.  I would have to think that this is going to be some variant of ITP.  There are not many conditions that can cause the platelets to be this low.  His blood smear is reassuring.  I do not see anything on the blood smear that looks like an underlying marrow malignancy such as leukemia or lymphoma.  For right now, I think we have to try to try to treat this as immune thrombocytopenia.  I would go ahead and get him on steroids.  I will go ahead and get him on prednisone  at 80 mg a day.  I would then begin to taper the dose depending on his response.  I would also have him receive a dose of Nplate .  For right now, I would hold off on IVIG and Rituxan.  I long talk with Mr. Mcmanamon.  He is incredibly eloquent.  He  is very knowledgeable.  He has a good understanding of what I feel the problem is.  I would probably hold off on a bone marrow biopsy right now.  If, for some reason, he does not respond to steroid induction, then I probably would consider him for a bone marrow biopsy.  I will hide make sure that he is on medication to help prevent steroid complications.  I will get him on some enasidenib, antiviral, and antifungal.  He will definitely need to have lab work done  weekly.  Hopefully, we will see that he we will respond within a week or so.  If so, then we will start making adjustments with his protocol.

## 2024-07-06 NOTE — Patient Instructions (Signed)
 Romiplostim Injection What is this medication? ROMIPLOSTIM (roe mi PLOE stim) treats low levels of platelets in your body caused by immune thrombocytopenia (ITP). It is prescribed when other medications have not worked or cannot be tolerated. It may also be used to help people who have been exposed to high doses of radiation. It works by increasing the amount of platelets in your blood. This lowers the risk of bleeding. This medicine may be used for other purposes; ask your health care provider or pharmacist if you have questions. COMMON BRAND NAME(S): Nplate What should I tell my care team before I take this medication? They need to know if you have any of these conditions: Blood clots Myelodysplastic syndrome An unusual or allergic reaction to romiplostim, mannitol, other medications, foods, dyes, or preservatives Pregnant or trying to get pregnant Breast-feeding How should I use this medication? This medication is injected under the skin. It is given by a care team in a hospital or clinic setting. A special MedGuide will be given to you before each treatment. Be sure to read this information carefully each time. Talk to your care team about the use of this medication in children. While it may be prescribed for children as young as newborns for selected conditions, precautions do apply. Overdosage: If you think you have taken too much of this medicine contact a poison control center or emergency room at once. NOTE: This medicine is only for you. Do not share this medicine with others. What if I miss a dose? Keep appointments for follow-up doses. It is important not to miss your dose. Call your care team if you are unable to keep an appointment. What may interact with this medication? Interactions are not expected. This list may not describe all possible interactions. Give your health care provider a list of all the medicines, herbs, non-prescription drugs, or dietary supplements you use. Also  tell them if you smoke, drink alcohol, or use illegal drugs. Some items may interact with your medicine. What should I watch for while using this medication? Visit your care team for regular checks on your progress. You may need blood work done while you are taking this medication. Your condition will be monitored carefully while you are receiving this medication. It is important not to miss any appointments. What side effects may I notice from receiving this medication? Side effects that you should report to your care team as soon as possible: Allergic reactions--skin rash, itching, hives, swelling of the face, lips, tongue, or throat Blood clot--pain, swelling, or warmth in the leg, shortness of breath, chest pain Side effects that usually do not require medical attention (report to your care team if they continue or are bothersome): Dizziness Joint pain Muscle pain Pain in the hands or feet Stomach pain Trouble sleeping This list may not describe all possible side effects. Call your doctor for medical advice about side effects. You may report side effects to FDA at 1-800-FDA-1088. Where should I keep my medication? This medication is given in a hospital or clinic. It will not be stored at home. NOTE: This sheet is a summary. It may not cover all possible information. If you have questions about this medicine, talk to your doctor, pharmacist, or health care provider.  2024 Elsevier/Gold Standard (2022-03-23 00:00:00)

## 2024-07-12 ENCOUNTER — Inpatient Hospital Stay

## 2024-07-12 DIAGNOSIS — D693 Immune thrombocytopenic purpura: Secondary | ICD-10-CM

## 2024-07-12 DIAGNOSIS — D75839 Thrombocytosis, unspecified: Secondary | ICD-10-CM | POA: Diagnosis not present

## 2024-07-12 DIAGNOSIS — R918 Other nonspecific abnormal finding of lung field: Secondary | ICD-10-CM | POA: Diagnosis not present

## 2024-07-12 DIAGNOSIS — I4891 Unspecified atrial fibrillation: Secondary | ICD-10-CM | POA: Diagnosis not present

## 2024-07-12 DIAGNOSIS — Z7952 Long term (current) use of systemic steroids: Secondary | ICD-10-CM | POA: Diagnosis not present

## 2024-07-12 DIAGNOSIS — Z7901 Long term (current) use of anticoagulants: Secondary | ICD-10-CM | POA: Diagnosis not present

## 2024-07-12 LAB — CBC WITH DIFFERENTIAL (CANCER CENTER ONLY)
Abs Immature Granulocytes: 0.66 K/uL — ABNORMAL HIGH (ref 0.00–0.07)
Basophils Absolute: 0.1 K/uL (ref 0.0–0.1)
Basophils Relative: 0 %
Eosinophils Absolute: 0 K/uL (ref 0.0–0.5)
Eosinophils Relative: 0 %
HCT: 33.8 % — ABNORMAL LOW (ref 39.0–52.0)
Hemoglobin: 11 g/dL — ABNORMAL LOW (ref 13.0–17.0)
Immature Granulocytes: 4 %
Lymphocytes Relative: 11 %
Lymphs Abs: 1.6 K/uL (ref 0.7–4.0)
MCH: 31.7 pg (ref 26.0–34.0)
MCHC: 32.5 g/dL (ref 30.0–36.0)
MCV: 97.4 fL (ref 80.0–100.0)
Monocytes Absolute: 0.7 K/uL (ref 0.1–1.0)
Monocytes Relative: 4 %
Neutro Abs: 12.6 K/uL — ABNORMAL HIGH (ref 1.7–7.7)
Neutrophils Relative %: 81 %
Platelet Count: 202 K/uL (ref 150–400)
RBC: 3.47 MIL/uL — ABNORMAL LOW (ref 4.22–5.81)
RDW: 16.3 % — ABNORMAL HIGH (ref 11.5–15.5)
WBC Count: 15.6 K/uL — ABNORMAL HIGH (ref 4.0–10.5)
nRBC: 1 % — ABNORMAL HIGH (ref 0.0–0.2)

## 2024-07-12 LAB — CMP (CANCER CENTER ONLY)
ALT: 56 U/L — ABNORMAL HIGH (ref 0–44)
AST: 27 U/L (ref 15–41)
Albumin: 3.7 g/dL (ref 3.5–5.0)
Alkaline Phosphatase: 76 U/L (ref 38–126)
Anion gap: 10 (ref 5–15)
BUN: 28 mg/dL — ABNORMAL HIGH (ref 8–23)
CO2: 28 mmol/L (ref 22–32)
Calcium: 8.7 mg/dL — ABNORMAL LOW (ref 8.9–10.3)
Chloride: 99 mmol/L (ref 98–111)
Creatinine: 1.16 mg/dL (ref 0.61–1.24)
GFR, Estimated: 59 mL/min — ABNORMAL LOW (ref >60)
Glucose, Bld: 112 mg/dL — ABNORMAL HIGH (ref 70–99)
Potassium: 4.6 mmol/L (ref 3.5–5.1)
Sodium: 136 mmol/L (ref 135–145)
Total Bilirubin: 0.9 mg/dL (ref 0.0–1.2)
Total Protein: 6.6 g/dL (ref 6.5–8.1)

## 2024-07-13 ENCOUNTER — Other Ambulatory Visit

## 2024-07-18 DIAGNOSIS — H401431 Capsular glaucoma with pseudoexfoliation of lens, bilateral, mild stage: Secondary | ICD-10-CM | POA: Diagnosis not present

## 2024-07-18 DIAGNOSIS — H524 Presbyopia: Secondary | ICD-10-CM | POA: Diagnosis not present

## 2024-07-20 ENCOUNTER — Encounter: Payer: Self-pay | Admitting: Hematology & Oncology

## 2024-07-20 ENCOUNTER — Inpatient Hospital Stay

## 2024-07-20 ENCOUNTER — Other Ambulatory Visit: Payer: Self-pay

## 2024-07-20 ENCOUNTER — Inpatient Hospital Stay (HOSPITAL_BASED_OUTPATIENT_CLINIC_OR_DEPARTMENT_OTHER): Admitting: Hematology & Oncology

## 2024-07-20 VITALS — BP 147/69 | HR 60 | Temp 97.5°F | Resp 18 | Ht 70.0 in | Wt 168.0 lb

## 2024-07-20 DIAGNOSIS — D693 Immune thrombocytopenic purpura: Secondary | ICD-10-CM

## 2024-07-20 DIAGNOSIS — R918 Other nonspecific abnormal finding of lung field: Secondary | ICD-10-CM | POA: Diagnosis not present

## 2024-07-20 DIAGNOSIS — Z7901 Long term (current) use of anticoagulants: Secondary | ICD-10-CM | POA: Diagnosis not present

## 2024-07-20 DIAGNOSIS — I4891 Unspecified atrial fibrillation: Secondary | ICD-10-CM | POA: Diagnosis not present

## 2024-07-20 DIAGNOSIS — Z7952 Long term (current) use of systemic steroids: Secondary | ICD-10-CM | POA: Diagnosis not present

## 2024-07-20 DIAGNOSIS — D75839 Thrombocytosis, unspecified: Secondary | ICD-10-CM | POA: Diagnosis not present

## 2024-07-20 LAB — CMP (CANCER CENTER ONLY)
ALT: 33 U/L (ref 0–44)
AST: 21 U/L (ref 15–41)
Albumin: 4 g/dL (ref 3.5–5.0)
Alkaline Phosphatase: 69 U/L (ref 38–126)
Anion gap: 9 (ref 5–15)
BUN: 25 mg/dL — ABNORMAL HIGH (ref 8–23)
CO2: 31 mmol/L (ref 22–32)
Calcium: 8.9 mg/dL (ref 8.9–10.3)
Chloride: 98 mmol/L (ref 98–111)
Creatinine: 1.05 mg/dL (ref 0.61–1.24)
GFR, Estimated: >60 mL/min (ref >60)
Glucose, Bld: 109 mg/dL — ABNORMAL HIGH (ref 70–99)
Potassium: 5.3 mmol/L — ABNORMAL HIGH (ref 3.5–5.1)
Sodium: 138 mmol/L (ref 135–145)
Total Bilirubin: 1.1 mg/dL (ref 0.0–1.2)
Total Protein: 6.4 g/dL — ABNORMAL LOW (ref 6.5–8.1)

## 2024-07-20 LAB — CBC WITH DIFFERENTIAL (CANCER CENTER ONLY)
Abs Immature Granulocytes: 0.42 K/uL — ABNORMAL HIGH (ref 0.00–0.07)
Basophils Absolute: 0 K/uL (ref 0.0–0.1)
Basophils Relative: 0 %
Eosinophils Absolute: 0.1 K/uL (ref 0.0–0.5)
Eosinophils Relative: 0 %
HCT: 35.9 % — ABNORMAL LOW (ref 39.0–52.0)
Hemoglobin: 11.5 g/dL — ABNORMAL LOW (ref 13.0–17.0)
Immature Granulocytes: 2 %
Lymphocytes Relative: 6 %
Lymphs Abs: 1.2 K/uL (ref 0.7–4.0)
MCH: 32 pg (ref 26.0–34.0)
MCHC: 32 g/dL (ref 30.0–36.0)
MCV: 100 fL (ref 80.0–100.0)
Monocytes Absolute: 0.9 K/uL (ref 0.1–1.0)
Monocytes Relative: 4 %
Neutro Abs: 19.4 K/uL — ABNORMAL HIGH (ref 1.7–7.7)
Neutrophils Relative %: 88 %
Platelet Count: 305 K/uL (ref 150–400)
RBC: 3.59 MIL/uL — ABNORMAL LOW (ref 4.22–5.81)
RDW: 19.8 % — ABNORMAL HIGH (ref 11.5–15.5)
WBC Count: 22 K/uL — ABNORMAL HIGH (ref 4.0–10.5)
nRBC: 0.2 % (ref 0.0–0.2)

## 2024-07-20 NOTE — Progress Notes (Signed)
 Hematology and Oncology Follow Up Visit  KWAME RYLAND [id number] [birth year] 89 y.o. 07/20/2024   Principle Diagnosis:  Chronic ITP  Current Therapy:   Prednisone  80 mg p.o. daily-change to 60 mg p.o. daily on 07/20/2024     Interim History:  [name] is back for follow-up.  This is his second office visit.  We first saw him back on 07/06/2024.  At that time, I thought that he clearly had immune thrombocytopenia.  We put him on prednisone .  We gave him a dose of Nplate .  He has done incredibly well.  His platelet count today is 305,000.  I told him he go back onto his Eliquis now.  I am sure cardiology would want him to go back onto the Eliquis because of the atrial fibrillation.  He says he is having hard time sleeping.  He does not want anything to help him sleep right now.  He has had a good appetite.  He has had no nausea or vomiting.  There has been no change in bowel or bladder habits.  He has had no bleeding.  There has been no bruising.  He has had no leg swelling.  He has had no headache.  Overall, his performance status is ECOG 1.  Medications:  Current Outpatient Medications:    dorzolamide-timolol (COSOPT) 2-0.5 % ophthalmic solution, Place 1 drop into the right eye in the morning and at bedtime., Disp: , Rfl:    famciclovir  (FAMVIR ) 250 MG tablet, Take 1 tablet (250 mg total) by mouth daily., Disp: 30 tablet, Rfl: 6   furosemide (LASIX) 40 MG tablet, Take 40 mg by mouth daily. (Patient taking differently: Take 20 mg by mouth daily.), Disp: , Rfl:    nitroGLYCERIN  (NITROSTAT ) 0.4 MG SL tablet, Place 1 tablet (0.4 mg total) under the tongue as needed., Disp: 25 tablet, Rfl: 3   nystatin  (MYCOSTATIN ) 100000 UNIT/ML suspension, Take 5 mLs (500,000 Units total) by mouth 4 (four) times daily., Disp: 500 mL, Rfl: 8   pantoprazole  (PROTONIX ) 40 MG tablet, Take 1 tablet (40 mg total) by mouth 2 (two) times daily., Disp: 60 tablet, Rfl: 8   pravastatin  (PRAVACHOL ) 10 MG  tablet, TAKE 1 TABLET BY MOUTH DAILY, Disp: 90 tablet, Rfl: 3   predniSONE  (DELTASONE ) 20 MG tablet, Take 4 tablets (80 mg total) by mouth daily with breakfast. You MUST take this with food., Disp: 100 tablet, Rfl: 4   SYNTHROID  125 MCG tablet, Take 125 mcg by mouth daily., Disp: , Rfl:    vitamin B-12 (CYANOCOBALAMIN ) 1000 MCG tablet, Take 1,000 mcg by mouth daily., Disp: , Rfl:    VITAMIN D PO, Take 1 tablet by mouth daily., Disp: , Rfl:   Allergies:  Allergies  Allergen Reactions   Celecoxib     Other Reaction(s): Unknown    Past Medical History, Surgical history, Social history, and Family History were reviewed and updated.  Review of Systems: Review of Systems  Constitutional: Negative.   HENT:  Negative.    Eyes: Negative.   Respiratory: Negative.    Cardiovascular: Negative.   Gastrointestinal: Negative.   Endocrine: Negative.   Genitourinary: Negative.    Musculoskeletal: Negative.   Skin: Negative.   Neurological: Negative.   Hematological: Negative.   Psychiatric/Behavioral:  Positive for sleep disturbance.     Physical Exam:  height is 5' 10 (1.778 m) and weight is 168 lb (76.2 kg). His oral temperature is 97.5 F (36.4 C) (abnormal). His blood pressure is 147/69 (abnormal) and  his pulse is 60. His respiration is 18 and oxygen  saturation is 95%.   Wt Readings from Last 3 Encounters:  07/20/24 168 lb (76.2 kg)  07/06/24 162 lb (73.5 kg)  06/28/24 163 lb 3.2 oz (74 kg)    Physical Exam Vitals reviewed.  HENT:     Head: Normocephalic and atraumatic.  Eyes:     Pupils: Pupils are equal, round, and reactive to light.  Cardiovascular:     Rate and Rhythm: Normal rate and regular rhythm.     Heart sounds: Normal heart sounds.  Pulmonary:     Effort: Pulmonary effort is normal.     Breath sounds: Normal breath sounds.  Abdominal:     General: Bowel sounds are normal.     Palpations: Abdomen is soft.  Musculoskeletal:        General: No tenderness or  deformity. Normal range of motion.     Cervical back: Normal range of motion.  Lymphadenopathy:     Cervical: No cervical adenopathy.  Skin:    General: Skin is warm and dry.     Findings: No erythema or rash.  Neurological:     Mental Status: He is alert and oriented to person, place, and time.  Psychiatric:        Behavior: Behavior normal.        Thought Content: Thought content normal.        Judgment: Judgment normal.      Lab Results  Component Value Date   WBC 22.0 (H) 07/20/2024   HGB 11.5 (L) 07/20/2024   HCT 35.9 (L) 07/20/2024   MCV 100.0 07/20/2024   PLT 305 07/20/2024     Chemistry      Component Value Date/Time   NA 138 07/20/2024 0807   K 5.3 (H) 07/20/2024 0807   CL 98 07/20/2024 0807   CO2 31 07/20/2024 0807   BUN 25 (H) 07/20/2024 0807   CREATININE 1.05 07/20/2024 0807      Component Value Date/Time   CALCIUM  8.9 07/20/2024 0807   ALKPHOS 69 07/20/2024 0807   AST 21 07/20/2024 0807   ALT 33 07/20/2024 0807   BILITOT 1.1 07/20/2024 0807      Impression and Plan: Mr. Dibiasio is a very nice 89 year old white male.  He certainly looks a lot younger.  He has ITP.  He has responded very nicely to prednisone .  We will go ahead and decrease his prednisone  to 60 mg a day.  I will keep him on 60 mg a day for about 3 weeks.  I will see him back, it was platelet count is still doing well, then we will decrease to 40 mg a day.  He does not need Nplate .  Continues on his antiacid.  He continues on his antiviral and nystatin .   [name], MD 8/21/20259:31 AM

## 2024-07-21 ENCOUNTER — Ambulatory Visit (HOSPITAL_COMMUNITY): Payer: Self-pay | Admitting: Internal Medicine

## 2024-07-21 ENCOUNTER — Ambulatory Visit (HOSPITAL_COMMUNITY)
Admission: RE | Admit: 2024-07-21 | Discharge: 2024-07-21 | Disposition: A | Discharge status: Home / Self Care | Source: Ambulatory Visit | Attending: Cardiology | Admitting: Cardiology

## 2024-07-21 ENCOUNTER — Other Ambulatory Visit

## 2024-07-21 ENCOUNTER — Ambulatory Visit: Admitting: Hematology & Oncology

## 2024-07-21 DIAGNOSIS — D6869 Other thrombophilia: Secondary | ICD-10-CM

## 2024-07-21 DIAGNOSIS — I4819 Other persistent atrial fibrillation: Secondary | ICD-10-CM | POA: Diagnosis not present

## 2024-07-21 LAB — ECHOCARDIOGRAM COMPLETE
AR max vel: 1.78 cm2
AV Area VTI: 1.72 cm2
AV Area mean vel: 1.63 cm2
AV Mean grad: 3 mmHg
AV Peak grad: 5.1 mmHg
Ao pk vel: 1.13 m/s
Area-P 1/2: 3.27 cm2
MV M vel: 2.72 m/s
MV Peak grad: 29.6 mmHg
S' Lateral: 3.06 cm

## 2024-07-24 ENCOUNTER — Ambulatory Visit (HOSPITAL_COMMUNITY)
Admission: RE | Admit: 2024-07-24 | Discharge: 2024-07-24 | Disposition: A | Discharge status: Home / Self Care | Source: Ambulatory Visit | Attending: Internal Medicine | Admitting: Internal Medicine

## 2024-07-24 VITALS — BP 162/80 | HR 48 | Ht 70.0 in | Wt 169.4 lb

## 2024-07-24 DIAGNOSIS — D6869 Other thrombophilia: Secondary | ICD-10-CM | POA: Diagnosis not present

## 2024-07-24 DIAGNOSIS — R748 Abnormal levels of other serum enzymes: Secondary | ICD-10-CM | POA: Diagnosis not present

## 2024-07-24 DIAGNOSIS — D696 Thrombocytopenia, unspecified: Secondary | ICD-10-CM | POA: Diagnosis not present

## 2024-07-24 DIAGNOSIS — J90 Pleural effusion, not elsewhere classified: Secondary | ICD-10-CM | POA: Diagnosis not present

## 2024-07-24 DIAGNOSIS — I2510 Atherosclerotic heart disease of native coronary artery without angina pectoris: Secondary | ICD-10-CM | POA: Diagnosis not present

## 2024-07-24 DIAGNOSIS — I252 Old myocardial infarction: Secondary | ICD-10-CM | POA: Insufficient documentation

## 2024-07-24 DIAGNOSIS — R0600 Dyspnea, unspecified: Secondary | ICD-10-CM | POA: Diagnosis not present

## 2024-07-24 DIAGNOSIS — G4700 Insomnia, unspecified: Secondary | ICD-10-CM | POA: Diagnosis not present

## 2024-07-24 DIAGNOSIS — R911 Solitary pulmonary nodule: Secondary | ICD-10-CM | POA: Diagnosis not present

## 2024-07-24 DIAGNOSIS — I4819 Other persistent atrial fibrillation: Secondary | ICD-10-CM | POA: Insufficient documentation

## 2024-07-24 DIAGNOSIS — Z7952 Long term (current) use of systemic steroids: Secondary | ICD-10-CM | POA: Insufficient documentation

## 2024-07-24 DIAGNOSIS — I517 Cardiomegaly: Secondary | ICD-10-CM | POA: Diagnosis not present

## 2024-07-24 DIAGNOSIS — E785 Hyperlipidemia, unspecified: Secondary | ICD-10-CM | POA: Diagnosis not present

## 2024-07-24 DIAGNOSIS — I4891 Unspecified atrial fibrillation: Secondary | ICD-10-CM | POA: Diagnosis not present

## 2024-07-24 DIAGNOSIS — Z7901 Long term (current) use of anticoagulants: Secondary | ICD-10-CM | POA: Diagnosis not present

## 2024-07-24 DIAGNOSIS — J189 Pneumonia, unspecified organism: Secondary | ICD-10-CM | POA: Diagnosis not present

## 2024-07-24 NOTE — Progress Notes (Signed)
 Primary Care Physician: Onita Rush, MD Primary Cardiologist: Peter Swaziland, MD Electrophysiologist: None     Referring Physician: Sammi Laymon Braver, NP     Joseph Roman is a 89 y.o. male with a history of CAD, MI 04/2008 s/p stent, s/p VATS due to left pleural effusion 12/2016 secondary to CAP, dyslipidemia, and atrial fibrillation who presents for consultation in the [facility]. Patient seen by PCP on 06/22/24 for pneumonia not improving and found to be in rate controlled Afib. CT chest on 06/13/24 showed small bilateral pleural effusions and multifocal pneumonia. Patient is on Eliquis 5 mg BID for a CHADS2VASC score of 3.  On evaluation today, he is currently in Afib. He feels tired and SOB currently. He does note to feel improvement overall since pneumonia diagnosis. He stopped ASA and began Eliquis 5 mg BID on 7/25.   On follow up 07/24/24, patient is in Afib. He restarted Eliquis on 8/21 after oncology visit and took two doses on 8/22. He is not sleeping well since starting prednisone .   Today, he denies symptoms of palpitations, chest pain, orthopnea, PND, lower extremity edema, dizziness, presyncope, syncope, snoring, daytime somnolence, bleeding, or neurologic sequela. The patient is tolerating medications without difficulties and is otherwise without complaint today.   he has a BMI of Body mass index is 24.31 kg/m.SABRA Filed Weights   07/24/24 0844  Weight: 76.8 kg    Current Outpatient Medications  Medication Sig Dispense Refill   dorzolamide-timolol (COSOPT) 2-0.5 % ophthalmic solution Place 1 drop into the right eye in the morning and at bedtime.     famciclovir  (FAMVIR ) 250 MG tablet Take 1 tablet (250 mg total) by mouth daily. 30 tablet 6   furosemide (LASIX) 40 MG tablet Take 40 mg by mouth daily. (Patient taking differently: Take 20 mg by mouth daily.)     nitroGLYCERIN  (NITROSTAT ) 0.4 MG SL tablet Place 1 tablet (0.4 mg total)  under the tongue as needed. 25 tablet 3   nystatin  (MYCOSTATIN ) 100000 UNIT/ML suspension Take 5 mLs (500,000 Units total) by mouth 4 (four) times daily. 500 mL 8   pantoprazole  (PROTONIX ) 40 MG tablet Take 1 tablet (40 mg total) by mouth 2 (two) times daily. 60 tablet 8   pravastatin  (PRAVACHOL ) 10 MG tablet TAKE 1 TABLET BY MOUTH DAILY 90 tablet 3   predniSONE  (DELTASONE ) 20 MG tablet Take 4 tablets (80 mg total) by mouth daily with breakfast. You MUST take this with food. (Patient taking differently: Take 60 mg by mouth daily with breakfast. You MUST take this with food.) 100 tablet 4   SYNTHROID  125 MCG tablet Take 125 mcg by mouth daily.     vitamin B-12 (CYANOCOBALAMIN ) 1000 MCG tablet Take 1,000 mcg by mouth daily.     VITAMIN D PO Take 1 tablet by mouth daily.     No current facility-administered medications for this encounter.    Atrial Fibrillation Management history:  Previous antiarrhythmic drugs: none Previous cardioversions: none Previous ablations: none Anticoagulation history: Eliquis   ROS- All systems are reviewed and negative except as per the HPI above.  Physical Exam: BP (!) 162/80   Pulse (!) 48   Ht 5' 10 (1.778 m)   Wt 76.8 kg   BMI 24.31 kg/m   GEN- The patient is well appearing, alert and oriented x 3 today.   Neck - no JVD or carotid bruit noted Lungs- Clear to ausculation bilaterally, normal work of breathing Heart- Irregular bradycardic  rate and rhythm, no murmurs, rubs or gallops, PMI not laterally displaced Extremities- no clubbing, cyanosis, or edema Skin - no rash or ecchymosis noted   EKG today demonstrates  Vent. rate 48 BPM PR interval * ms QRS duration 82 ms QT/QTcB 430/384 ms P-R-T axes * 84 50 Atrial fibrillation with slow ventricular response Abnormal ECG When compared with ECG of 28-Jun-2024 15:01, No significant change was found  Echo 07/21/24: 1. Left ventricular ejection fraction, by estimation, is 55 to 60%. The  left  ventricle has normal function. The left ventricle has no regional  wall motion abnormalities. Left ventricular diastolic parameters are  indeterminate.   2. Right ventricular systolic function is normal. The right ventricular  size is normal. There is normal pulmonary artery systolic pressure.   3. Left atrial size was mildly dilated.   4. Right atrial size was mildly dilated.   5. The mitral valve is grossly normal. Mild mitral valve regurgitation.  Moderate mitral annular calcification.   6. The aortic valve is tricuspid. Aortic valve regurgitation is not  visualized.   7. The inferior vena cava is dilated in size with <50% respiratory  variability, suggesting right atrial pressure of 15 mmHg.    ASSESSMENT & PLAN CHA2DS2-VASc Score = 3  The patient's score is based upon: CHF History: 0 HTN History: 0 Diabetes History: 0 Stroke History: 0 Vascular Disease History: 1 Age Score: 2 Gender Score: 0       ASSESSMENT AND PLAN: Persistent Atrial Fibrillation (ICD10:  I48.19) The patient's CHA2DS2-VASc score is 3, indicating a 3.2% annual risk of stroke.    He is currently in Afib. He is back on Eliquis 5 mg BID per primary oncologist which is great news. He is currently on prednisone  60 mg a day which should be lowered to 40 mg in about 3 weeks. I discussed with patient the procedure cardioversion and he remains interested in doing this. He just restarted Eliquis on 8/22. I will plan to see patient back in ~6 weeks; at that time he should be fully anticoagulated for cardioversion and hopefully on a lower dose of prednisone  to help minimize the potential of ERAF after procedure.    Secondary Hypercoagulable State (ICD10:  D68.69) The patient is at significant risk for stroke/thromboembolism based upon his CHA2DS2-VASc Score of 3.  Continue Apixaban (Eliquis).  Continue Eliquis 5 mg BID without interruption.    Follow up 6 weeks.    Terra Pac, PA-C  [facility] [address] [place], [place] [address] [phone]

## 2024-08-03 ENCOUNTER — Inpatient Hospital Stay: Attending: Hematology & Oncology

## 2024-08-03 DIAGNOSIS — D693 Immune thrombocytopenic purpura: Secondary | ICD-10-CM | POA: Diagnosis not present

## 2024-08-03 DIAGNOSIS — Z7952 Long term (current) use of systemic steroids: Secondary | ICD-10-CM | POA: Diagnosis not present

## 2024-08-03 LAB — CMP (CANCER CENTER ONLY)
ALT: 24 U/L (ref 0–44)
AST: 16 U/L (ref 15–41)
Albumin: 4.1 g/dL (ref 3.5–5.0)
Alkaline Phosphatase: 72 U/L (ref 38–126)
Anion gap: 10 (ref 5–15)
BUN: 31 mg/dL — ABNORMAL HIGH (ref 8–23)
CO2: 33 mmol/L — ABNORMAL HIGH (ref 22–32)
Calcium: 9.3 mg/dL (ref 8.9–10.3)
Chloride: 96 mmol/L — ABNORMAL LOW (ref 98–111)
Creatinine: 1.15 mg/dL (ref 0.61–1.24)
GFR, Estimated: 60 mL/min — ABNORMAL LOW (ref >60)
Glucose, Bld: 109 mg/dL — ABNORMAL HIGH (ref 70–99)
Potassium: 4.7 mmol/L (ref 3.5–5.1)
Sodium: 139 mmol/L (ref 135–145)
Total Bilirubin: 1.2 mg/dL (ref 0.0–1.2)
Total Protein: 6.7 g/dL (ref 6.5–8.1)

## 2024-08-03 LAB — CBC WITH DIFFERENTIAL (CANCER CENTER ONLY)
Abs Immature Granulocytes: 0.16 K/uL — ABNORMAL HIGH (ref 0.00–0.07)
Basophils Absolute: 0 K/uL (ref 0.0–0.1)
Basophils Relative: 0 %
Eosinophils Absolute: 0 K/uL (ref 0.0–0.5)
Eosinophils Relative: 0 %
HCT: 37.4 % — ABNORMAL LOW (ref 39.0–52.0)
Hemoglobin: 12.2 g/dL — ABNORMAL LOW (ref 13.0–17.0)
Immature Granulocytes: 1 %
Lymphocytes Relative: 5 %
Lymphs Abs: 0.7 K/uL (ref 0.7–4.0)
MCH: 33.2 pg (ref 26.0–34.0)
MCHC: 32.6 g/dL (ref 30.0–36.0)
MCV: 101.9 fL — ABNORMAL HIGH (ref 80.0–100.0)
Monocytes Absolute: 0.4 K/uL (ref 0.1–1.0)
Monocytes Relative: 3 %
Neutro Abs: 13.4 K/uL — ABNORMAL HIGH (ref 1.7–7.7)
Neutrophils Relative %: 91 %
Platelet Count: 205 K/uL (ref 150–400)
RBC: 3.67 MIL/uL — ABNORMAL LOW (ref 4.22–5.81)
RDW: 20.7 % — ABNORMAL HIGH (ref 11.5–15.5)
WBC Count: 14.7 K/uL — ABNORMAL HIGH (ref 4.0–10.5)
nRBC: 0 % (ref 0.0–0.2)

## 2024-08-11 ENCOUNTER — Inpatient Hospital Stay (HOSPITAL_BASED_OUTPATIENT_CLINIC_OR_DEPARTMENT_OTHER): Admitting: Hematology & Oncology

## 2024-08-11 ENCOUNTER — Inpatient Hospital Stay

## 2024-08-11 ENCOUNTER — Encounter: Payer: Self-pay | Admitting: Hematology & Oncology

## 2024-08-11 VITALS — BP 124/71 | HR 55 | Temp 98.2°F | Resp 18 | Ht 70.0 in | Wt 170.1 lb

## 2024-08-11 DIAGNOSIS — Z7952 Long term (current) use of systemic steroids: Secondary | ICD-10-CM | POA: Diagnosis not present

## 2024-08-11 DIAGNOSIS — C439 Malignant melanoma of skin, unspecified: Secondary | ICD-10-CM | POA: Diagnosis not present

## 2024-08-11 DIAGNOSIS — D693 Immune thrombocytopenic purpura: Secondary | ICD-10-CM

## 2024-08-11 LAB — CMP (CANCER CENTER ONLY)
ALT: 25 U/L (ref 0–44)
AST: 18 U/L (ref 15–41)
Albumin: 4 g/dL (ref 3.5–5.0)
Alkaline Phosphatase: 85 U/L (ref 38–126)
Anion gap: 9 (ref 5–15)
BUN: 25 mg/dL — ABNORMAL HIGH (ref 8–23)
CO2: 33 mmol/L — ABNORMAL HIGH (ref 22–32)
Calcium: 9.3 mg/dL (ref 8.9–10.3)
Chloride: 95 mmol/L — ABNORMAL LOW (ref 98–111)
Creatinine: 1.21 mg/dL (ref 0.61–1.24)
GFR, Estimated: 56 mL/min — ABNORMAL LOW (ref >60)
Glucose, Bld: 107 mg/dL — ABNORMAL HIGH (ref 70–99)
Potassium: 5 mmol/L (ref 3.5–5.1)
Sodium: 137 mmol/L (ref 135–145)
Total Bilirubin: 0.9 mg/dL (ref 0.0–1.2)
Total Protein: 6.7 g/dL (ref 6.5–8.1)

## 2024-08-11 LAB — CBC WITH DIFFERENTIAL (CANCER CENTER ONLY)
Abs Immature Granulocytes: 0.3 K/uL — ABNORMAL HIGH (ref 0.00–0.07)
Basophils Absolute: 0 K/uL (ref 0.0–0.1)
Basophils Relative: 0 %
Eosinophils Absolute: 0.1 K/uL (ref 0.0–0.5)
Eosinophils Relative: 0 %
HCT: 38.4 % — ABNORMAL LOW (ref 39.0–52.0)
Hemoglobin: 12.4 g/dL — ABNORMAL LOW (ref 13.0–17.0)
Immature Granulocytes: 2 %
Lymphocytes Relative: 5 %
Lymphs Abs: 0.8 K/uL (ref 0.7–4.0)
MCH: 33.3 pg (ref 26.0–34.0)
MCHC: 32.3 g/dL (ref 30.0–36.0)
MCV: 103.2 fL — ABNORMAL HIGH (ref 80.0–100.0)
Monocytes Absolute: 0.5 K/uL (ref 0.1–1.0)
Monocytes Relative: 3 %
Neutro Abs: 15.6 K/uL — ABNORMAL HIGH (ref 1.7–7.7)
Neutrophils Relative %: 90 %
Platelet Count: 301 K/uL (ref 150–400)
RBC: 3.72 MIL/uL — ABNORMAL LOW (ref 4.22–5.81)
RDW: 20.5 % — ABNORMAL HIGH (ref 11.5–15.5)
WBC Count: 17.3 K/uL — ABNORMAL HIGH (ref 4.0–10.5)
nRBC: 0.2 % (ref 0.0–0.2)

## 2024-08-11 LAB — SAVE SMEAR(SSMR), FOR PROVIDER SLIDE REVIEW

## 2024-08-11 LAB — LACTATE DEHYDROGENASE: LDH: 250 U/L — ABNORMAL HIGH (ref 98–192)

## 2024-08-11 NOTE — Progress Notes (Signed)
 Hematology and Oncology Follow Up Visit  Joseph Roman [id number] [birth year] 89 y.o. 08/11/2024   Principle Diagnosis:  Chronic ITP  Current Therapy:   Prednisone  60 mg p.o. daily-change to 40 mg p.o. daily on 08/11/2024     Interim History:  Joseph Roman is back for follow-up.  He has responded very well to prednisone .  I am very happy about this.  We will now decrease his prednisone  down to 40 mg a day.  He is on Eliquis.  He is doing well on the Eliquis.  There is no bleeding with Eliquis.  He has had no problems with bowels or bladder.  He is not sleeping all that well because of the prednisone  which is understandable.  He has had no issues with nausea or vomiting.  He has had no problems with rashes.  He has had no fever.  He has had no mouth sores.  Overall, I would say that his performance status is ECOG 1.    Medications:  Current Outpatient Medications:    amoxicillin  (AMOXIL ) 500 MG capsule, Take 500 mg by mouth 3 (three) times daily., Disp: , Rfl:    apixaban (ELIQUIS) 5 MG TABS tablet, Take 5 mg by mouth 2 (two) times daily., Disp: , Rfl:    dorzolamide-timolol (COSOPT) 2-0.5 % ophthalmic solution, Place 1 drop into the right eye in the morning and at bedtime., Disp: , Rfl:    famciclovir  (FAMVIR ) 250 MG tablet, Take 1 tablet (250 mg total) by mouth daily., Disp: 30 tablet, Rfl: 6   furosemide (LASIX) 40 MG tablet, Take 40 mg by mouth daily., Disp: , Rfl:    pantoprazole  (PROTONIX ) 40 MG tablet, Take 1 tablet (40 mg total) by mouth 2 (two) times daily., Disp: 60 tablet, Rfl: 8   pravastatin  (PRAVACHOL ) 10 MG tablet, TAKE 1 TABLET BY MOUTH DAILY, Disp: 90 tablet, Rfl: 3   predniSONE  (DELTASONE ) 20 MG tablet, Take 4 tablets (80 mg total) by mouth daily with breakfast. You MUST take this with food. (Patient taking differently: Take 60 mg by mouth daily with breakfast. You MUST take this with food.), Disp: 100 tablet, Rfl: 4   SYNTHROID  125 MCG tablet, Take 125 mcg by  mouth daily., Disp: , Rfl:    vitamin B-12 (CYANOCOBALAMIN ) 1000 MCG tablet, Take 1,000 mcg by mouth daily., Disp: , Rfl:    VITAMIN D PO, Take 1 tablet by mouth daily., Disp: , Rfl:    nitroGLYCERIN  (NITROSTAT ) 0.4 MG SL tablet, Place 1 tablet (0.4 mg total) under the tongue as needed. (Patient not taking: Reported on 08/11/2024), Disp: 25 tablet, Rfl: 3   nystatin  (MYCOSTATIN ) 100000 UNIT/ML suspension, Take 5 mLs (500,000 Units total) by mouth 4 (four) times daily. (Patient not taking: Reported on 08/11/2024), Disp: 500 mL, Rfl: 8  Allergies:  Allergies  Allergen Reactions   Celecoxib     Other Reaction(s): Unknown    Past Medical History, Surgical history, Social history, and Family History were reviewed and updated.  Review of Systems: Review of Systems  Constitutional: Negative.   HENT:  Negative.    Eyes: Negative.   Respiratory: Negative.    Cardiovascular: Negative.   Gastrointestinal: Negative.   Endocrine: Negative.   Genitourinary: Negative.    Musculoskeletal: Negative.   Skin: Negative.   Neurological: Negative.   Hematological: Negative.   Psychiatric/Behavioral:  Positive for sleep disturbance.     Physical Exam:  height is 5' 10 (1.778 m) and weight is 170 lb 1.9 oz (77.2  kg). His oral temperature is 98.2 F (36.8 C). His blood pressure is 124/71 and his pulse is 55 (abnormal). His respiration is 18 and oxygen  saturation is 100%.   Wt Readings from Last 3 Encounters:  08/11/24 170 lb 1.9 oz (77.2 kg)  07/24/24 169 lb 6.4 oz (76.8 kg)  07/20/24 168 lb (76.2 kg)    Physical Exam Vitals reviewed.  HENT:     Head: Normocephalic and atraumatic.  Eyes:     Pupils: Pupils are equal, round, and reactive to light.  Cardiovascular:     Rate and Rhythm: Normal rate and regular rhythm.     Heart sounds: Normal heart sounds.  Pulmonary:     Effort: Pulmonary effort is normal.     Breath sounds: Normal breath sounds.  Abdominal:     General: Bowel sounds are  normal.     Palpations: Abdomen is soft.  Musculoskeletal:        General: No tenderness or deformity. Normal range of motion.     Cervical back: Normal range of motion.  Lymphadenopathy:     Cervical: No cervical adenopathy.  Skin:    General: Skin is warm and dry.     Findings: No erythema or rash.  Neurological:     Mental Status: He is alert and oriented to person, place, and time.  Psychiatric:        Behavior: Behavior normal.        Thought Content: Thought content normal.        Judgment: Judgment normal.      Lab Results  Component Value Date   WBC 17.3 (H) 08/11/2024   HGB 12.4 (L) 08/11/2024   HCT 38.4 (L) 08/11/2024   MCV 103.2 (H) 08/11/2024   PLT 301 08/11/2024     Chemistry      Component Value Date/Time   NA 139 08/03/2024 0801   K 4.7 08/03/2024 0801   CL 96 (L) 08/03/2024 0801   CO2 33 (H) 08/03/2024 0801   BUN 31 (H) 08/03/2024 0801   CREATININE 1.15 08/03/2024 0801      Component Value Date/Time   CALCIUM  9.3 08/03/2024 0801   ALKPHOS 72 08/03/2024 0801   AST 16 08/03/2024 0801   ALT 24 08/03/2024 0801   BILITOT 1.2 08/03/2024 0801      Impression and Plan: Joseph Roman is a very nice 89 year old white male.  He certainly looks a lot younger.  He has ITP.  He has responded very nicely to prednisone .  We will go ahead and decrease his prednisone  to 40 mg a day.  I will see him back, if his platelet count continues to stabilize, then we will decrease it down to 40 mg today.  He does not need any Nplate  today.  I am just happy that he has responded so well.  I will see him back in 3 weeks.    [name], MD 9/12/20258:23 AM

## 2024-08-15 DIAGNOSIS — H02005 Unspecified entropion of left lower eyelid: Secondary | ICD-10-CM | POA: Diagnosis not present

## 2024-08-15 DIAGNOSIS — H401431 Capsular glaucoma with pseudoexfoliation of lens, bilateral, mild stage: Secondary | ICD-10-CM | POA: Diagnosis not present

## 2024-08-21 ENCOUNTER — Encounter (INDEPENDENT_AMBULATORY_CARE_PROVIDER_SITE_OTHER): Payer: Self-pay

## 2024-08-24 DIAGNOSIS — H02035 Senile entropion of left lower eyelid: Secondary | ICD-10-CM | POA: Diagnosis not present

## 2024-08-29 ENCOUNTER — Telehealth: Payer: Self-pay

## 2024-08-29 NOTE — Telephone Encounter (Signed)
Clinical pharmacist to review Eliquis 

## 2024-08-29 NOTE — Telephone Encounter (Signed)
 Patient with diagnosis of afib on Eliquis for anticoagulation.    Procedure: LEFT LOWER LID ENTROPION REPAIR  Date of procedure: 09/04/24   CHA2DS2-VASc Score = 3   This indicates a 3.2% annual risk of stroke. The patient's score is based upon: CHF History: 0 HTN History: 0 Diabetes History: 0 Stroke History: 0 Vascular Disease History: 1 Age Score: 2 Gender Score: 0      CrCl 42 ml/min Platelet count 301  Patient has not had an Afib/aflutter ablation or Watchman within the last 3 months or DCCV within the last 30 days   Of note, there was some consideration of cardioversion and patient should be made aware that he will have to wait at least 3 weeks after his procedure for cardioversion if one is needed.  Per office protocol, patient can hold Eliquis for 2 days prior to procedure.    **This guidance is not considered finalized until pre-operative APP has relayed final recommendations.**

## 2024-08-29 NOTE — Telephone Encounter (Signed)
   Pre-operative Risk Assessment    Patient Name: Joseph Roman  DOB: 10-04-1930 MRN: [id number]   Date of last office visit: 07/24/24 FAIRY TERRA RIGGERS ([facility] CLINIC), 01/26/24 PETER SWAZILAND, MD Date of next office visit: 09/08/24 FAIRY TERRA, PA-C [facility])   Request for Surgical Clearance    Procedure:  LEFT LOWER LID ENTROPION REPAIR  Date of Surgery:  Clearance 09/04/24                                Surgeon:  CURTISTINE DITCH, MD Surgeon's Group or Practice Name:  [facility] Phone number:  [phone] Fax number:  [phone]   Type of Clearance Requested:   - Medical  - Pharmacy:  Hold Apixaban (Eliquis)     Type of Anesthesia:  Not Indicated   Additional requests/questions:    SignedMaijor, [name]   08/29/2024, 10:45 AM

## 2024-08-30 ENCOUNTER — Telehealth (HOSPITAL_BASED_OUTPATIENT_CLINIC_OR_DEPARTMENT_OTHER): Payer: Self-pay | Admitting: *Deleted

## 2024-08-30 NOTE — Telephone Encounter (Signed)
 Pt has been scheduled tele preop appt 08/31/24. Clearance request just received today for 09/04/24 procedure with med hold.    Med rec and consent are done.

## 2024-08-30 NOTE — Telephone Encounter (Signed)
 Pt has been scheduled tele preop appt 08/31/24. Clearance request just received today for 09/04/24 procedure with med hold.    Med rec and consent are done.     Patient Consent for Virtual Visit        Joseph Roman has provided verbal consent on 08/30/2024 for a virtual visit (video or telephone).   CONSENT FOR VIRTUAL VISIT FOR:  Joseph Roman  By participating in this virtual visit I agree to the following:  I hereby voluntarily request, consent and authorize [facility] and its employed or contracted physicians, physician assistants, nurse practitioners or other licensed health care professionals (the Practitioner), to provide me with telemedicine health care services (the "Services) as deemed necessary by the treating Practitioner. I acknowledge and consent to receive the Services by the Practitioner via telemedicine. I understand that the telemedicine visit will involve communicating with the Practitioner through live audiovisual communication technology and the disclosure of certain medical information by electronic transmission. I acknowledge that I have been given the opportunity to request an in-person assessment or other available alternative prior to the telemedicine visit and am voluntarily participating in the telemedicine visit.  I understand that I have the right to withhold or withdraw my consent to the use of telemedicine in the course of my care at any time, without affecting my right to future care or treatment, and that the Practitioner or I may terminate the telemedicine visit at any time. I understand that I have the right to inspect all information obtained and/or recorded in the course of the telemedicine visit and may receive copies of available information for a reasonable fee.  I understand that some of the potential risks of receiving the Services via telemedicine include:  Delay or interruption in medical evaluation due to technological equipment  failure or disruption; Information transmitted may not be sufficient (e.g. poor resolution of images) to allow for appropriate medical decision making by the Practitioner; and/or  In rare instances, security protocols could fail, causing a breach of personal health information.  Furthermore, I acknowledge that it is my responsibility to provide information about my medical history, conditions and care that is complete and accurate to the best of my ability. I acknowledge that Practitioner's advice, recommendations, and/or decision may be based on factors not within their control, such as incomplete or inaccurate data provided by me or distortions of diagnostic images or specimens that may result from electronic transmissions. I understand that the practice of medicine is not an exact science and that Practitioner makes no warranties or guarantees regarding treatment outcomes. I acknowledge that a copy of this consent can be made available to me via my patient portal [facility] MyChart), or I can request a printed copy by calling the office of [facility].    I understand that my insurance will be billed for this visit.   I have read or had this consent read to me. I understand the contents of this consent, which adequately explains the benefits and risks of the Services being provided via telemedicine.  I have been provided ample opportunity to ask questions regarding this consent and the Services and have had my questions answered to my satisfaction. I give my informed consent for the services to be provided through the use of telemedicine in my medical care

## 2024-08-30 NOTE — Telephone Encounter (Signed)
 Primary Cardiologist:Peter Swaziland, MD   Preoperative team, please contact this patient and set up a phone call appointment for further preoperative risk assessment. Please obtain consent and complete medication review. Thank you for your help.   I confirm that guidance regarding antiplatelet and oral anticoagulation therapy has been completed and, if necessary, noted below.  Of note, there was some consideration of cardioversion and patient should be made aware that he will have to wait at least 3 weeks after his procedure for cardioversion if one is needed.  Per office protocol, patient can hold Eliquis for 2 days prior to procedure.    I also confirmed the patient resides in the state of [place] . As per [facility] Medical Board telemedicine laws, the patient must reside in the state in which the provider is licensed.   Rosaline EMERSON Bane, NP-C  08/30/2024, 7:05 AM [address], [address] [place], [place] [address] Office [phone] Fax [phone]

## 2024-08-31 ENCOUNTER — Ambulatory Visit: Attending: Internal Medicine

## 2024-08-31 DIAGNOSIS — Z01810 Encounter for preprocedural cardiovascular examination: Secondary | ICD-10-CM | POA: Insufficient documentation

## 2024-08-31 NOTE — Progress Notes (Signed)
 Virtual Visit via Telephone Note   Because of Joseph Roman co-morbid illnesses, he is at least at moderate risk for complications without adequate follow up.  This format is felt to be most appropriate for this patient at this time.  Due to technical limitations with video connection (technology), today's appointment will be conducted as an audio only telehealth visit, and Joseph Roman verbally agreed to proceed in this manner.   All issues noted in this document were discussed and addressed.  No physical exam could be performed with this format.  Evaluation Performed:  Preoperative cardiovascular risk assessment _____________   Date:  08/31/2024   Patient ID:  Joseph Roman, DOB Apr 14, 1930, MRN [id number] Patient Location:  Home Provider location:   Office  Primary Care Provider:  Onita Rush, MD Primary Cardiologist:  Peter Swaziland, MD  Chief Complaint / Patient Profile   89 y.o. y/o male with a h/o CAD s/p inferior MI 2009 with persistent AF (on Eliquis), PCI/DES to mid left circumflex, GERD, VATS due to pleural effusion 2018 hypothyroidism, HLD, sinus bradycardia who is pending lower lid entropion repair and presents today for telephonic preoperative cardiovascular risk assessment.  History of Present Illness    Joseph Roman is a 89 y.o. male who presents via audio/video conferencing for a telehealth visit today.  Pt was last seen in cardiology clinic on 01/06/2024 by Dr. Swaziland.  At that time Joseph Roman was doing well with no new cardiac complaints.  He was diagnosed with atrial fibrillation on 05/2024 secondary to multifocal pneumonia.  The patient is now pending procedure as outlined above. Since his last visit, he reports doing well with his atrial fibrillation and treatment for idiopathic thrombocytopenia.  He denies chest pain, shortness of breath, lower extremity edema, fatigue, palpitations, melena, hematuria, hemoptysis, diaphoresis, weakness,  presyncope, syncope, orthopnea, and PND.   Past Medical History    Past Medical History:  Diagnosis Date   Acute inferolateral myocardial infarction [facility]) 04/2008   2.75x23 Promus Lcx   Arthritis    comes and goes; hits everywhere (12/29/2016)   Chronic ITP (idiopathic thrombocytopenia) (HCC) 07/06/2024   Coronary artery disease    Dyslipidemia    Mild dyslipidemia   GERD (gastroesophageal reflux disease)    Hypothyroidism    Pneumonia 12/29/2016   Past Surgical History:  Procedure Laterality Date   CATARACT EXTRACTION W/ INTRAOCULAR LENS  IMPLANT, BILATERAL Bilateral 2010-2016   right-left   COLONOSCOPY W/ BIOPSIES AND POLYPECTOMY  04/21/2005   CORONARY ANGIOPLASTY WITH STENT PLACEMENT  05/07/2008   Ejection fraction was estimated at 60%   PLEURAL EFFUSION DRAINAGE Left 12/31/2016   Procedure: DRAINAGE OF PLEURAL EFFUSION/empyema;  Surgeon: Dorise MARLA Fellers, MD;  Location: [facility] OR;  Service: Thoracic;  Laterality: Left;   TONSILLECTOMY     VIDEO ASSISTED THORACOSCOPY Left 12/31/2016   Procedure: VIDEO ASSISTED THORACOSCOPY;  Surgeon: Dorise MARLA Fellers, MD;  Location: [facility] OR;  Service: Thoracic;  Laterality: Left;    Allergies  Allergies  Allergen Reactions   Celecoxib     Other Reaction(s): Unknown    Home Medications    Prior to Admission medications   Medication Sig Start Date End Date Taking? Authorizing Provider  amoxicillin  (AMOXIL ) 500 MG capsule Take 500 mg by mouth 3 (three) times daily. 08/07/24   [provider]  apixaban (ELIQUIS) 5 MG TABS tablet Take 5 mg by mouth 2 (two) times daily.    [provider]  dorzolamide-timolol (COSOPT) 2-0.5 %  ophthalmic solution Place 1 drop into the right eye in the morning and at bedtime. 10/18/23   [provider]  famciclovir  (FAMVIR ) 250 MG tablet Take 1 tablet (250 mg total) by mouth daily. 07/06/24   Timmy Maude SAUNDERS, MD  furosemide (LASIX) 40 MG tablet Take 40 mg by mouth daily. 06/22/24   [provider]  nitroGLYCERIN  (NITROSTAT ) 0.4 MG SL tablet Place 1 tablet (0.4 mg total) under the tongue as needed. Patient not taking: Reported on 08/30/2024 06/05/16   Barrett, Shona MATSU, PA-C  nystatin  (MYCOSTATIN ) 100000 UNIT/ML suspension Take 5 mLs (500,000 Units total) by mouth 4 (four) times daily. Patient not taking: Reported on 08/30/2024 07/06/24   Timmy Maude SAUNDERS, MD  pantoprazole  (PROTONIX ) 40 MG tablet Take 1 tablet (40 mg total) by mouth 2 (two) times daily. 07/06/24   Timmy Maude SAUNDERS, MD  pravastatin  (PRAVACHOL ) 10 MG tablet TAKE 1 TABLET BY MOUTH DAILY 03/08/24   Swaziland, Peter M, MD  predniSONE  (DELTASONE ) 20 MG tablet Take 4 tablets (80 mg total) by mouth daily with breakfast. You MUST take this with food. Patient taking differently: Take 60 mg by mouth daily with breakfast. You MUST take this with food. 07/06/24   Timmy Maude SAUNDERS, MD  SYNTHROID  125 MCG tablet Take 125 mcg by mouth daily. 08/29/21   [provider]  vitamin B-12 (CYANOCOBALAMIN ) 1000 MCG tablet Take 1,000 mcg by mouth daily.    [provider]  VITAMIN D PO Take 1 tablet by mouth daily.    [provider]    Physical Exam    Vital Signs:  Joseph Roman does not have vital signs available for review today.  Given telephonic nature of communication, physical exam is limited. AAOx3. NAD. Normal affect.  Speech and respirations are unlabored.  Accessory Clinical Findings    None  Assessment & Plan    1.  Preoperative Cardiovascular Risk Assessment: - Patient's RCRI score is 0.9%  The patient affirms he has been doing well without any new cardiac symptoms. They are able to achieve 7 METS without cardiac limitations. Therefore, based on ACC/AHA guidelines, the patient would be at acceptable risk for the planned procedure without further cardiovascular testing. The patient was advised that if he develops new symptoms prior to surgery to contact our office to arrange for a follow-up  visit, and he verbalized understanding.   Patient can hold Eliquis 2 days prior to procedure and should restart postprocedure when surgically safe and hemostasis is achieved.  The patient was advised that if he develops new symptoms prior to surgery to contact our office to arrange for a follow-up visit, and he verbalized understanding.   A copy of this note will be routed to requesting surgeon.  Time:   Today, I have spent 6 minutes with the patient with telehealth technology discussing medical history, symptoms, and management plan.     Wyn Raddle, Jackee Shove, NP  08/31/2024, 7:29 AM

## 2024-09-01 ENCOUNTER — Inpatient Hospital Stay: Attending: Hematology & Oncology

## 2024-09-01 ENCOUNTER — Telehealth: Payer: Self-pay | Admitting: Cardiology

## 2024-09-01 ENCOUNTER — Inpatient Hospital Stay (HOSPITAL_BASED_OUTPATIENT_CLINIC_OR_DEPARTMENT_OTHER): Admitting: Hematology & Oncology

## 2024-09-01 ENCOUNTER — Other Ambulatory Visit: Payer: Self-pay

## 2024-09-01 VITALS — BP 119/68 | HR 50 | Temp 97.8°F | Resp 18 | Ht 70.0 in | Wt 178.0 lb

## 2024-09-01 DIAGNOSIS — D693 Immune thrombocytopenic purpura: Secondary | ICD-10-CM

## 2024-09-01 DIAGNOSIS — Z7952 Long term (current) use of systemic steroids: Secondary | ICD-10-CM | POA: Diagnosis not present

## 2024-09-01 LAB — CBC WITH DIFFERENTIAL (CANCER CENTER ONLY)
Abs Immature Granulocytes: 0.24 K/uL — ABNORMAL HIGH (ref 0.00–0.07)
Basophils Absolute: 0 K/uL (ref 0.0–0.1)
Basophils Relative: 0 %
Eosinophils Absolute: 0 K/uL (ref 0.0–0.5)
Eosinophils Relative: 0 %
HCT: 36.4 % — ABNORMAL LOW (ref 39.0–52.0)
Hemoglobin: 12 g/dL — ABNORMAL LOW (ref 13.0–17.0)
Immature Granulocytes: 2 %
Lymphocytes Relative: 11 %
Lymphs Abs: 1.4 K/uL (ref 0.7–4.0)
MCH: 34.2 pg — ABNORMAL HIGH (ref 26.0–34.0)
MCHC: 33 g/dL (ref 30.0–36.0)
MCV: 103.7 fL — ABNORMAL HIGH (ref 80.0–100.0)
Monocytes Absolute: 0.6 K/uL (ref 0.1–1.0)
Monocytes Relative: 4 %
Neutro Abs: 11.2 K/uL — ABNORMAL HIGH (ref 1.7–7.7)
Neutrophils Relative %: 83 %
Platelet Count: 290 K/uL (ref 150–400)
RBC: 3.51 MIL/uL — ABNORMAL LOW (ref 4.22–5.81)
RDW: 20.9 % — ABNORMAL HIGH (ref 11.5–15.5)
WBC Count: 13.5 K/uL — ABNORMAL HIGH (ref 4.0–10.5)
nRBC: 0.6 % — ABNORMAL HIGH (ref 0.0–0.2)

## 2024-09-01 LAB — CMP (CANCER CENTER ONLY)
ALT: 30 U/L (ref 0–44)
AST: 18 U/L (ref 15–41)
Albumin: 4.1 g/dL (ref 3.5–5.0)
Alkaline Phosphatase: 72 U/L (ref 38–126)
Anion gap: 10 (ref 5–15)
BUN: 26 mg/dL — ABNORMAL HIGH (ref 8–23)
CO2: 31 mmol/L (ref 22–32)
Calcium: 9 mg/dL (ref 8.9–10.3)
Chloride: 97 mmol/L — ABNORMAL LOW (ref 98–111)
Creatinine: 1.19 mg/dL (ref 0.61–1.24)
GFR, Estimated: 57 mL/min — ABNORMAL LOW (ref >60)
Glucose, Bld: 119 mg/dL — ABNORMAL HIGH (ref 70–99)
Potassium: 4.3 mmol/L (ref 3.5–5.1)
Sodium: 138 mmol/L (ref 135–145)
Total Bilirubin: 1.1 mg/dL (ref 0.0–1.2)
Total Protein: 6.6 g/dL (ref 6.5–8.1)

## 2024-09-01 LAB — SAVE SMEAR(SSMR), FOR PROVIDER SLIDE REVIEW

## 2024-09-01 NOTE — Progress Notes (Signed)
 Hematology and Oncology Follow Up Visit  Joseph Roman [id number] [birth year] 89 y.o. 09/01/2024   Principle Diagnosis:  Chronic ITP  Current Therapy:   Prednisone  40 mg p.o. daily-change to 20 mg p.o. daily on 10/01/30/2024     Interim History:  Joseph Roman is back for follow-up.  He continues to do quite well.  He is on 40 mg of prednisone  a day.  Outside of not sleeping as much, he really has had no side effects.  He has had no issues with bleeding.  There is been no bruising.  He has had no mouth sores.  He has had no change in bowel or bladder habits.  He has had no cough or shortness of breath.  There is been no rashes.  He does have leg swelling.  He continues on his Eliquis.  He has been married 69 years.  I am incredibly impressed by this.  He has had no problems with headache.  There is been no visual changes.  Overall, I would say that his performance status is probably ECOG 1.    Medications:  Current Outpatient Medications:    amoxicillin  (AMOXIL ) 500 MG capsule, Take 500 mg by mouth 3 (three) times daily., Disp: , Rfl:    apixaban (ELIQUIS) 5 MG TABS tablet, Take 5 mg by mouth 2 (two) times daily., Disp: , Rfl:    dorzolamide-timolol (COSOPT) 2-0.5 % ophthalmic solution, Place 1 drop into the right eye in the morning and at bedtime., Disp: , Rfl:    famciclovir  (FAMVIR ) 250 MG tablet, Take 1 tablet (250 mg total) by mouth daily., Disp: 30 tablet, Rfl: 6   furosemide (LASIX) 40 MG tablet, Take 40 mg by mouth daily., Disp: , Rfl:    nitroGLYCERIN  (NITROSTAT ) 0.4 MG SL tablet, Place 1 tablet (0.4 mg total) under the tongue as needed. (Patient not taking: Reported on 08/30/2024), Disp: 25 tablet, Rfl: 3   nystatin  (MYCOSTATIN ) 100000 UNIT/ML suspension, Take 5 mLs (500,000 Units total) by mouth 4 (four) times daily. (Patient not taking: Reported on 08/30/2024), Disp: 500 mL, Rfl: 8   pantoprazole  (PROTONIX ) 40 MG tablet, Take 1 tablet (40 mg total) by mouth 2 (two) times  daily., Disp: 60 tablet, Rfl: 8   pravastatin  (PRAVACHOL ) 10 MG tablet, TAKE 1 TABLET BY MOUTH DAILY, Disp: 90 tablet, Rfl: 3   predniSONE  (DELTASONE ) 20 MG tablet, Take 4 tablets (80 mg total) by mouth daily with breakfast. You MUST take this with food. (Patient taking differently: Take 60 mg by mouth daily with breakfast. You MUST take this with food.), Disp: 100 tablet, Rfl: 4   SYNTHROID  125 MCG tablet, Take 125 mcg by mouth daily., Disp: , Rfl:    vitamin B-12 (CYANOCOBALAMIN ) 1000 MCG tablet, Take 1,000 mcg by mouth daily., Disp: , Rfl:    VITAMIN D PO, Take 1 tablet by mouth daily., Disp: , Rfl:   Allergies:  Allergies  Allergen Reactions   Celecoxib     Other Reaction(s): Unknown    Past Medical History, Surgical history, Social history, and Family History were reviewed and updated.  Review of Systems: Review of Systems  Constitutional: Negative.   HENT:  Negative.    Eyes: Negative.   Respiratory: Negative.    Cardiovascular: Negative.   Gastrointestinal: Negative.   Endocrine: Negative.   Genitourinary: Negative.    Musculoskeletal: Negative.   Skin: Negative.   Neurological: Negative.   Hematological: Negative.   Psychiatric/Behavioral:  Positive for sleep disturbance.  Physical Exam:  height is 5' 10 (1.778 m) and weight is 178 lb (80.7 kg). His oral temperature is 97.8 F (36.6 C). His blood pressure is 119/68 and his pulse is 50 (abnormal). His respiration is 18 and oxygen  saturation is 99%.   Wt Readings from Last 3 Encounters:  09/01/24 178 lb (80.7 kg)  08/11/24 170 lb 1.9 oz (77.2 kg)  07/24/24 169 lb 6.4 oz (76.8 kg)    Physical Exam Vitals reviewed.  HENT:     Head: Normocephalic and atraumatic.  Eyes:     Pupils: Pupils are equal, round, and reactive to light.  Cardiovascular:     Rate and Rhythm: Normal rate and regular rhythm.     Heart sounds: Normal heart sounds.  Pulmonary:     Effort: Pulmonary effort is normal.     Breath sounds:  Normal breath sounds.  Abdominal:     General: Bowel sounds are normal.     Palpations: Abdomen is soft.  Musculoskeletal:        General: No tenderness or deformity. Normal range of motion.     Cervical back: Normal range of motion.  Lymphadenopathy:     Cervical: No cervical adenopathy.  Skin:    General: Skin is warm and dry.     Findings: No erythema or rash.  Neurological:     Mental Status: He is alert and oriented to person, place, and time.  Psychiatric:        Behavior: Behavior normal.        Thought Content: Thought content normal.        Judgment: Judgment normal.      Lab Results  Component Value Date   WBC 13.5 (H) 09/01/2024   HGB 12.0 (L) 09/01/2024   HCT 36.4 (L) 09/01/2024   MCV 103.7 (H) 09/01/2024   PLT 290 09/01/2024     Chemistry      Component Value Date/Time   NA 137 08/11/2024 0800   K 5.0 08/11/2024 0800   CL 95 (L) 08/11/2024 0800   CO2 33 (H) 08/11/2024 0800   BUN 25 (H) 08/11/2024 0800   CREATININE 1.21 08/11/2024 0800      Component Value Date/Time   CALCIUM  9.3 08/11/2024 0800   ALKPHOS 85 08/11/2024 0800   AST 18 08/11/2024 0800   ALT 25 08/11/2024 0800   BILITOT 0.9 08/11/2024 0800      Impression and Plan: Joseph Roman is a very nice 89 year old white male.  He certainly looks a lot younger.  He has ITP.  He has responded very nicely to prednisone .  We will go ahead and decrease his prednisone  to 20 mg a day.   We will plan to get him back in a month.  If his blood count continues to stabilize, we will then decrease him to 10 mg a day.  Hopefully, we will be able to get him off steroids over the Holiday season.     [name], MD 10/3/20258:11 AM

## 2024-09-01 NOTE — Telephone Encounter (Signed)
 Dr. Rodgers wants a call back to discuss questions regarding patient surgery on Monday.  Dr. Rodgers noted he was going in to see a patient but can speak with his staff.

## 2024-09-01 NOTE — Telephone Encounter (Signed)
 Message from [facility]

## 2024-09-01 NOTE — Telephone Encounter (Signed)
 [facility] calling to request callback from [facility]. Dr. Rodgers would like to speak with her regarding patient. Dr. Rodgers can be reached directly on his cell phone at [phone]. Please advise.

## 2024-09-01 NOTE — Telephone Encounter (Signed)
 Please message Rosaline Bane directly.

## 2024-09-01 NOTE — Telephone Encounter (Signed)
 Call was handled by the preop APP Jackee Alberts, NP.

## 2024-09-02 DIAGNOSIS — Z23 Encounter for immunization: Secondary | ICD-10-CM | POA: Diagnosis not present

## 2024-09-04 DIAGNOSIS — H02035 Senile entropion of left lower eyelid: Secondary | ICD-10-CM | POA: Diagnosis not present

## 2024-09-06 ENCOUNTER — Encounter (INDEPENDENT_AMBULATORY_CARE_PROVIDER_SITE_OTHER): Payer: Self-pay

## 2024-09-06 ENCOUNTER — Ambulatory Visit (INDEPENDENT_AMBULATORY_CARE_PROVIDER_SITE_OTHER)

## 2024-09-06 VITALS — BP 113/74 | HR 67 | Temp 97.6°F | Ht 70.0 in | Wt 168.0 lb

## 2024-09-06 DIAGNOSIS — R040 Epistaxis: Secondary | ICD-10-CM

## 2024-09-06 DIAGNOSIS — H9042 Sensorineural hearing loss, unilateral, left ear, with unrestricted hearing on the contralateral side: Secondary | ICD-10-CM

## 2024-09-06 DIAGNOSIS — H9120 Sudden idiopathic hearing loss, unspecified ear: Secondary | ICD-10-CM

## 2024-09-06 NOTE — Progress Notes (Signed)
 HPI:   Joseph Roman is a 89 y.o. male who presents as a new patient as he is seeking to establish care after his ENT retired. Patient has been following with Dr. 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 Patient did undergo exploratory tympanotomy, four IT dexamethasone injections and ultimately had a BICROS system placed. Patient states that he does not feel the quality of sound from his hearing aids is adequate at this time and feels that he has difficulty hearing in crowded situations. He denies any otalgia, otorrhea, or dizziness. No other otologic concerns. He does note recurrent epistaxis every 6-8 weeks which resolves on its own. Does not use a humidifier. No significant bleeding episodes.    PMH/Meds/All/SocHx/FamHx/ROS: Past Medical History:  Diagnosis Date   Acute inferolateral myocardial infarction (HCC) 04/2008   2.75x23 Promus Lcx   Arthritis    comes and goes; hits everywhere (12/29/2016)   Chronic ITP (idiopathic thrombocytopenia) (HCC) 07/06/2024   Coronary artery disease    Dyslipidemia    Mild dyslipidemia   GERD (gastroesophageal reflux disease)    Hypothyroidism    Pneumonia 12/29/2016   Past Surgical History:  Procedure Laterality Date   CATARACT EXTRACTION W/ INTRAOCULAR LENS  IMPLANT, BILATERAL Bilateral 2010-2016   right-left   COLONOSCOPY W/ BIOPSIES AND POLYPECTOMY  04/21/2005   CORONARY ANGIOPLASTY WITH STENT PLACEMENT  05/07/2008   Ejection fraction was estimated at 60%   PLEURAL EFFUSION DRAINAGE Left 12/31/2016   Procedure: DRAINAGE OF PLEURAL EFFUSION/empyema;  Surgeon: Dorise MARLA Fellers, MD;  Location[facility] OR;  Service: Thoracic;  Laterality: Left;   TONSILLECTOMY     VIDEO ASSISTED THORACOSCOPY Left 12/31/2016   Procedure: VIDEO ASSISTED THORACOSCOPY;  Surgeon: Dorise MARLA Fellers, MD;  Location: [facility] OR;  Service: Thoracic;  Laterality: Left;   No family history of bleeding disorders, wound healing problems or difficulty with  anesthesia.  Social Connections: Not on file    Current Outpatient Medications:    amoxicillin  (AMOXIL ) 500 MG capsule, Take 500 mg by mouth 3 (three) times daily. (Patient not taking: Reported on 09/06/2024), Disp: , Rfl:    apixaban (ELIQUIS) 5 MG TABS tablet, Take 5 mg by mouth 2 (two) times daily., Disp: , Rfl:    dorzolamide-timolol (COSOPT) 2-0.5 % ophthalmic solution, Place 1 drop into the right eye in the morning and at bedtime., Disp: , Rfl:    famciclovir  (FAMVIR ) 250 MG tablet, Take 1 tablet (250 mg total) by mouth daily., Disp: 30 tablet, Rfl: 6   furosemide (LASIX) 40 MG tablet, Take 40 mg by mouth daily., Disp: , Rfl:    nitroGLYCERIN  (NITROSTAT ) 0.4 MG SL tablet, Place 1 tablet (0.4 mg total) under the tongue as needed. (Patient not taking: Reported on 08/30/2024), Disp: 25 tablet, Rfl: 3   nystatin  (MYCOSTATIN ) 100000 UNIT/ML suspension, Take 5 mLs (500,000 Units total) by mouth 4 (four) times daily. (Patient not taking: Reported on 08/30/2024), Disp: 500 mL, Rfl: 8   pantoprazole  (PROTONIX ) 40 MG tablet, Take 1 tablet (40 mg total) by mouth 2 (two) times daily., Disp: 60 tablet, Rfl: 8   pravastatin  (PRAVACHOL ) 10 MG tablet, TAKE 1 TABLET BY MOUTH DAILY, Disp: 90 tablet, Rfl: 3   predniSONE  (DELTASONE ) 20 MG tablet, Take 4 tablets (80 mg total) by mouth daily with breakfast. You MUST take this with food. (Patient taking differently: Take 60 mg by mouth daily with breakfast. You MUST take this with food.), Disp: 100 tablet, Rfl: 4   SYNTHROID  125  MCG tablet, Take 125 mcg by mouth daily., Disp: , Rfl:    vitamin B-12 (CYANOCOBALAMIN ) 1000 MCG tablet, Take 1,000 mcg by mouth daily., Disp: , Rfl:    VITAMIN D PO, Take 1 tablet by mouth daily., Disp: , Rfl:  A complete ROS was performed with pertinent positives/negatives noted in the HPI. The remainder of the ROS are negative.   Physical Exam:  There were no vitals taken for this visit. General: Well developed, well nourished. No acute  distress. Voice normal Head/Face: Normocephalic. No sinus tenderness. Facial nerve intact and equal bilaterally. No facial lacerations. Eyes: PERRL, no scleral icterus or conjunctival hemorrhage. EOMI. Left lateral [place] healing wound from recent lesion excision - healing well Ears: No gross deformity. Normal external canal. Tympanic membrane in tact bilaterally Hearing: Normal speech reception.  Nose: No gross deformity or lesions. No purulent discharge. No turbinate hypertrophy. Left septal mucosa with a 0.5 cm area of blanching likely from previous cautery Mouth/Oropharynx: Lips without any lesions. Dentition fair. No mucosal lesions within the oropharynx. No tonsillar enlargement, exudate, or lesions. Pharyngeal walls symmetrical. Uvula midline. Tongue midline without lesions. Neck: Trachea midline. No masses. No thyromegaly or nodules palpated. No crepitus. Lymphatic: No lymphadenopathy in the neck. Respiratory: No stridor or distress. Room air. Cardiovascular: Regular rate and rhythm. Extremities: No edema or cyanosis. Warm and well-perfused. Skin: No scars or lesions on face or neck. Neurologic: CN II-XII grossly intact. Moving all extremities without gross abnormality. Other:   Independent Review of Additional Tests or Records: Records from Dr. Thaddeus reviewed prior to visit detailing: Exploratory tympanotomy 07/29/2018 IT dexamethasone injections x 4  Audiology evaluations with audiograms x 2 - 07/27/2018 - severe left SNHL - 09/29/2018 - severe left SNHL   Procedures: None  Impression & Plans: Joseph Roman is a 89 y.o. male with left sudden SNHL in August 2019 and has been following with Dr. Thaddeus. Patient presented today to establish care with ENT.   Sudden SNHL - left side - BICROS in place - Audiogram ordered  Recurrent epistaxis - Ayr nasal saline gel PRN to keep nasal mucosa moist  Patient states that overall he has no new otologic concerns but that his  hearing aids are not functioning appropriately and require some adjustments. Patient does need an updated audiogram and hearing aid evaluation. Plan to see Audiologist. Follow-up as needed  Adah Malkin, DO [facility]

## 2024-09-07 ENCOUNTER — Ambulatory Visit (INDEPENDENT_AMBULATORY_CARE_PROVIDER_SITE_OTHER): Admitting: Audiology

## 2024-09-07 DIAGNOSIS — H903 Sensorineural hearing loss, bilateral: Secondary | ICD-10-CM

## 2024-09-07 NOTE — Progress Notes (Signed)
  [address], [address] [place], [place] [address] [phone]  Audiological Evaluation    Name: Joseph Roman     DOB:   [birth year]      MRN:   [id number]                                                                                     Service Date: 09/07/2024     Accompanied by: unaccompanied   Patient comes today after Dr. Mila, ENT sent a referral for a hearing evaluation due to concerns with hearing loss.   Symptoms Yes Details  Hearing loss  [x]  Both ears, known to be worse int he left ear for years  Tinnitus  []    Ear pain/ infections/pressure  []    Balance problems  []    Noise exposure history  []    Previous ear surgeries  [x]  Reportedly a left ear tube by Dr. Thaddeus while managing the sudden hearing loss.  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 Left ear:  Clear external ear canal and notable landmarks visualized on the tympanic membrane.  Tympanometry: Right ear: Type A- Normal external ear canal volume with normal middle ear pressure and tympanic membrane compliance. Left ear: Type A- Normal external ear canal volume with normal middle ear pressure and tympanic membrane compliance.   Pure tone Audiometry: Right ear- Normal sloping to severe sensorineural hearing loss from 125 Hz - 8000 Hz. Left ear-  Mild to profound sensorineural hearing loss from 125 Hz - 8000 Hz.  Speech Audiometry: Right ear- Speech Reception Threshold (SRT) was obtained at 40 dBHL. Left ear-Speech Reception Threshold (SRT) was obtained at 80 dBHL., with contralateral masking.   Word Recognition Score Tested using NU-6 (recorded) Right ear: 92% was obtained at a presentation level of 80 dBHL with contralateral masking which is deemed as   excellent. Left ear: 4% was obtained at a presentation level of 95 dBHL (MCL) with contralateral masking which is deemed as  poor.   The hearing test results were completed under headphones and results are deemed to be of good reliability. Test technique:  conventional    Impression: Left hearing asymmetry is observed. Patient reported had a sudden SNHL around 2019 that did not respond to steroids.  Recommendations: Follow up with ENT as scheduled for today. Return for a hearing evaluation if concerns with hearing changes arise or per MD recommendation. Recommend reprogram aids as needed and consider running live speech mapping. Patients says he wants to have it done here and willing to pay out of pocket ( around $60.00). Patient will be scheduled at checkout for a hearing aid check.    Kamyla Olejnik MARIE LEROUX-MARTINEZ, AUD

## 2024-09-08 ENCOUNTER — Encounter (HOSPITAL_COMMUNITY): Payer: Self-pay | Admitting: Internal Medicine

## 2024-09-08 ENCOUNTER — Ambulatory Visit (HOSPITAL_COMMUNITY)
Admission: RE | Admit: 2024-09-08 | Discharge: 2024-09-08 | Disposition: A | Discharge status: Home / Self Care | Source: Ambulatory Visit | Attending: Internal Medicine | Admitting: Internal Medicine

## 2024-09-08 VITALS — BP 132/70 | HR 55 | Ht 70.0 in | Wt 178.0 lb

## 2024-09-08 DIAGNOSIS — I4819 Other persistent atrial fibrillation: Secondary | ICD-10-CM | POA: Insufficient documentation

## 2024-09-08 DIAGNOSIS — D6869 Other thrombophilia: Secondary | ICD-10-CM | POA: Diagnosis not present

## 2024-09-08 NOTE — Progress Notes (Signed)
 Primary Care Physician: Onita Rush, MD Primary Cardiologist: Peter Swaziland, MD Electrophysiologist: None     Referring Physician: Sammi Laymon Braver, NP     Joseph Roman is a 89 y.o. male with a history of CAD, MI 04/2008 s/p stent, s/p VATS due to left pleural effusion 12/2016 secondary to CAP, dyslipidemia, and atrial fibrillation who presents for consultation in the [facility]. Patient seen by PCP on 06/22/24 for pneumonia not improving and found to be in rate controlled Afib. CT chest on 06/13/24 showed small bilateral pleural effusions and multifocal pneumonia. Patient is on Eliquis 5 mg BID for a CHADS2VASC score of 3.  On evaluation today, he is currently in Afib. He feels tired and SOB currently. He does note to feel improvement overall since pneumonia diagnosis. He stopped ASA and began Eliquis 5 mg BID on 7/25.   On follow up 07/24/24, patient is in Afib. He restarted Eliquis on 8/21 after oncology visit and took two doses on 8/22. He is not sleeping well since starting prednisone .   On follow up 09/08/24, patient is currently in Afib. He notes prednisone  has been lowered since last bloodwork. He is taking prednisone  20 mg daily. He notes it is negatively affecting his sleep. It will sometimes lead to affecting his gait. No missed doses of Eliquis since evening of 9/29. It was held due to eye procedure.  Today, he denies symptoms of palpitations, chest pain, orthopnea, PND, lower extremity edema, dizziness, presyncope, syncope, snoring, daytime somnolence, bleeding, or neurologic sequela. The patient is tolerating medications without difficulties and is otherwise without complaint today.   he has a BMI of Body mass index is 25.54 kg/m.SABRA Filed Weights   09/08/24 0953  Weight: 80.7 kg     Current Outpatient Medications  Medication Sig Dispense Refill   apixaban (ELIQUIS) 5 MG TABS tablet Take 5 mg by mouth 2 (two) times daily.      dorzolamide-timolol (COSOPT) 2-0.5 % ophthalmic solution Place 1 drop into the right eye in the morning and at bedtime.     erythromycin ophthalmic ointment Place 1 Application into both eyes once.     famciclovir  (FAMVIR ) 250 MG tablet Take 1 tablet (250 mg total) by mouth daily. 30 tablet 6   furosemide (LASIX) 40 MG tablet Take 40 mg by mouth daily.     nitroGLYCERIN  (NITROSTAT ) 0.4 MG SL tablet Place 1 tablet (0.4 mg total) under the tongue as needed. 25 tablet 3   nystatin  (MYCOSTATIN ) 100000 UNIT/ML suspension Take 5 mLs (500,000 Units total) by mouth 4 (four) times daily. 500 mL 8   pantoprazole  (PROTONIX ) 40 MG tablet Take 1 tablet (40 mg total) by mouth 2 (two) times daily. 60 tablet 8   pravastatin  (PRAVACHOL ) 10 MG tablet TAKE 1 TABLET BY MOUTH DAILY 90 tablet 3   predniSONE  (DELTASONE ) 20 MG tablet Take 4 tablets (80 mg total) by mouth daily with breakfast. You MUST take this with food. (Patient taking differently: Take 60 mg by mouth daily with breakfast. You MUST take this with food.) 100 tablet 4   SYNTHROID  125 MCG tablet Take 125 mcg by mouth daily.     vitamin B-12 (CYANOCOBALAMIN ) 1000 MCG tablet Take 1,000 mcg by mouth daily.     VITAMIN D PO Take 1 tablet by mouth daily.     No current facility-administered medications for this encounter.    Atrial Fibrillation Management history:  Previous antiarrhythmic drugs: none Previous cardioversions: none Previous ablations:  none Anticoagulation history: Eliquis   ROS- All systems are reviewed and negative except as per the HPI above.  Physical Exam: BP 132/70   Pulse (!) 55   Ht 5' 10 (1.778 m)   Wt 80.7 kg   BMI 25.54 kg/m   GEN- The patient is well appearing, alert and oriented x 3 today.   Neck - no JVD or carotid bruit noted Lungs- Clear to ausculation bilaterally, normal work of breathing Heart- Irregular bradycardic rate and rhythm, no murmurs, rubs or gallops, PMI not laterally displaced Extremities- no  clubbing, cyanosis, or edema Skin - no rash or ecchymosis noted   EKG today demonstrates  Vent. rate 55 BPM PR interval * ms QRS duration 80 ms QT/QTcB 392/375 ms P-R-T axes * 75 43 Atrial fibrillation with slow ventricular response Nonspecific ST abnormality Abnormal ECG When compared with ECG of 24-Jul-2024 08:53, No significant change was found  Echo 07/21/24: 1. Left ventricular ejection fraction, by estimation, is 55 to 60%. The  left ventricle has normal function. The left ventricle has no regional  wall motion abnormalities. Left ventricular diastolic parameters are  indeterminate.   2. Right ventricular systolic function is normal. The right ventricular  size is normal. There is normal pulmonary artery systolic pressure.   3. Left atrial size was mildly dilated.   4. Right atrial size was mildly dilated.   5. The mitral valve is grossly normal. Mild mitral valve regurgitation.  Moderate mitral annular calcification.   6. The aortic valve is tricuspid. Aortic valve regurgitation is not  visualized.   7. The inferior vena cava is dilated in size with <50% respiratory  variability, suggesting right atrial pressure of 15 mmHg.    ASSESSMENT & PLAN CHA2DS2-VASc Score = 3  The patient's score is based upon: CHF History: 0 HTN History: 0 Diabetes History: 0 Stroke History: 0 Vascular Disease History: 1 Age Score: 2 Gender Score: 0       ASSESSMENT AND PLAN: Persistent Atrial Fibrillation (ICD10:  I48.19) The patient's CHA2DS2-VASc score is 3, indicating a 3.2% annual risk of stroke.    Patient is currently in Afib. He is still taking prednisone  20 mg daily and notes it has negatively affected his sleep for the past few months. Due to this, will wait for his next round of bloodwork which will determine what will be done with his prednisone  therapy. I believe if patient is not sleeping well and still on prednisone , it will lower our probability of maintaining sinus  rhythm post cardioversion. We will wait until end of month when he will receive guidance as to prednisone  therapy. I explained cardioversion procedure to patient, what to expect, and potential risks. He would like to do cardioversion procedure.   Secondary Hypercoagulable State (ICD10:  D68.69) The patient is at significant risk for stroke/thromboembolism based upon his CHA2DS2-VASc Score of 3.  Continue Apixaban (Eliquis).  No missed doses.     Patient will contact clinic with update in a few weeks.    Terra Pac, PA-C  Afib [facility] [address] [place], [place] [address] [phone]

## 2024-09-11 ENCOUNTER — Ambulatory Visit (INDEPENDENT_AMBULATORY_CARE_PROVIDER_SITE_OTHER): Payer: Self-pay | Admitting: Audiology

## 2024-09-11 DIAGNOSIS — H903 Sensorineural hearing loss, bilateral: Secondary | ICD-10-CM

## 2024-09-11 NOTE — Progress Notes (Signed)
  [address], [address] [place], [place] [address] [phone]  Hearing Aid Check     Joseph Roman comes for a scheduled appointment for a hearing aid check.    Accompanied ab:[id number]   Right Left  Hearing aid manufacturer Renaldo Kirschner L90-R [id number] Renaldo Kirschner L90-R [id number]  Hearing aid style Receiver in the canal Receiver in the canal  Hearing aid battery rechargeable rechargeable  Receiver 26M 2  Dome/ custom earpiece Medium vented dome Small vented dome  Retention wire yes yes  Warranty expiration date 06-01-2026 per [employer] 06-01-2026 per [employer]  Loss and Damage unknown unknown  Initial fitting date unknown unknown  Device was fit at: Initially at [employer] Initially at [employer]    Chief complaint: Patient reports cannot hear well with his hearing aid would like it to be reprogrammed.  Actions taken: Cleaned aids. Changed right dome from small to medium vented dome. Ran Armed forces logistics/support/administrative officer Ran LSM and made changes to meet NAL-NAL2 prescription targets. Patient did not wish to connect his aids to his phone.  Services fee: $60 was paid at checkout.  Patient was oriented about allowing 2-3 weeks of full time hearing aid use to get used to the new hearing aid settings.  Recommend: Return for a hearing aid check  if cannot get used to the changes. Return for a hearing evaluation and to see an ENT, if concerns with hearing changes arise.    Alainah Phang MARIE LEROUX-MARTINEZ, AUD

## 2024-09-21 DIAGNOSIS — H02035 Senile entropion of left lower eyelid: Secondary | ICD-10-CM | POA: Diagnosis not present

## 2024-09-21 DIAGNOSIS — H0012 Chalazion right lower eyelid: Secondary | ICD-10-CM | POA: Diagnosis not present

## 2024-09-21 DIAGNOSIS — H02423 Myogenic ptosis of bilateral eyelids: Secondary | ICD-10-CM | POA: Diagnosis not present

## 2024-09-29 ENCOUNTER — Encounter: Payer: Self-pay | Admitting: Hematology & Oncology

## 2024-09-29 ENCOUNTER — Inpatient Hospital Stay (HOSPITAL_BASED_OUTPATIENT_CLINIC_OR_DEPARTMENT_OTHER): Admitting: Hematology & Oncology

## 2024-09-29 ENCOUNTER — Inpatient Hospital Stay

## 2024-09-29 VITALS — BP 112/73 | HR 51 | Temp 97.6°F | Resp 20 | Ht 70.0 in | Wt 182.4 lb

## 2024-09-29 DIAGNOSIS — D693 Immune thrombocytopenic purpura: Secondary | ICD-10-CM

## 2024-09-29 DIAGNOSIS — Z7952 Long term (current) use of systemic steroids: Secondary | ICD-10-CM | POA: Diagnosis not present

## 2024-09-29 LAB — CBC WITH DIFFERENTIAL (CANCER CENTER ONLY)
Abs Immature Granulocytes: 0.16 K/uL — ABNORMAL HIGH (ref 0.00–0.07)
Basophils Absolute: 0 K/uL (ref 0.0–0.1)
Basophils Relative: 0 %
Eosinophils Absolute: 0.1 K/uL (ref 0.0–0.5)
Eosinophils Relative: 1 %
HCT: 35 % — ABNORMAL LOW (ref 39.0–52.0)
Hemoglobin: 11.7 g/dL — ABNORMAL LOW (ref 13.0–17.0)
Immature Granulocytes: 1 %
Lymphocytes Relative: 13 %
Lymphs Abs: 1.6 K/uL (ref 0.7–4.0)
MCH: 35.9 pg — ABNORMAL HIGH (ref 26.0–34.0)
MCHC: 33.4 g/dL (ref 30.0–36.0)
MCV: 107.4 fL — ABNORMAL HIGH (ref 80.0–100.0)
Monocytes Absolute: 0.7 K/uL (ref 0.1–1.0)
Monocytes Relative: 6 %
Neutro Abs: 9.5 K/uL — ABNORMAL HIGH (ref 1.7–7.7)
Neutrophils Relative %: 79 %
Platelet Count: 301 K/uL (ref 150–400)
RBC: 3.26 MIL/uL — ABNORMAL LOW (ref 4.22–5.81)
RDW: 19.3 % — ABNORMAL HIGH (ref 11.5–15.5)
WBC Count: 12 K/uL — ABNORMAL HIGH (ref 4.0–10.5)
nRBC: 0.3 % — ABNORMAL HIGH (ref 0.0–0.2)

## 2024-09-29 LAB — SAVE SMEAR(SSMR), FOR PROVIDER SLIDE REVIEW

## 2024-09-29 LAB — CMP (CANCER CENTER ONLY)
ALT: 23 U/L (ref 0–44)
AST: 20 U/L (ref 15–41)
Albumin: 4.1 g/dL (ref 3.5–5.0)
Alkaline Phosphatase: 68 U/L (ref 38–126)
Anion gap: 11 (ref 5–15)
BUN: 31 mg/dL — ABNORMAL HIGH (ref 8–23)
CO2: 30 mmol/L (ref 22–32)
Calcium: 9.2 mg/dL (ref 8.9–10.3)
Chloride: 99 mmol/L (ref 98–111)
Creatinine: 1.31 mg/dL — ABNORMAL HIGH (ref 0.61–1.24)
GFR, Estimated: 51 mL/min — ABNORMAL LOW (ref >60)
Glucose, Bld: 158 mg/dL — ABNORMAL HIGH (ref 70–99)
Potassium: 4.1 mmol/L (ref 3.5–5.1)
Sodium: 140 mmol/L (ref 135–145)
Total Bilirubin: 1.2 mg/dL (ref 0.0–1.2)
Total Protein: 6.2 g/dL — ABNORMAL LOW (ref 6.5–8.1)

## 2024-09-29 NOTE — Progress Notes (Signed)
 Hematology and Oncology Follow Up Visit  Joseph Roman [id number] [birth year] 89 y.o. 09/29/2024   Principle Diagnosis:  Chronic ITP  Current Therapy:   Prednisone  20 mg p.o. daily-change to 10 mg p.o. daily on 09/29/2024     Interim History:  [name] is back for follow-up.  As expected, he is doing incredibly well.  We can now cut his prednisone  dose down to 10 mg a day.  He is having some bruising.  I am sure this is probably from the commendation of the Eliquis and the prednisone ..  His little bit of swelling.  This is more so in the right leg and the left leg.  He has had no problems with bleeding.  He has had no issues with his heart.  He is on Eliquis.  He has had no nausea or vomiting.  Has been no change in bowel or bladder habits.  He has had no rashes.  He has had no headache.  There is been no mouth sores.  Overall, I will say that his performance status is probably ECOG 0.    Medications:  Current Outpatient Medications:    apixaban (ELIQUIS) 5 MG TABS tablet, Take 5 mg by mouth 2 (two) times daily., Disp: , Rfl:    dorzolamide-timolol (COSOPT) 2-0.5 % ophthalmic solution, Place 1 drop into the right eye in the morning and at bedtime., Disp: , Rfl:    famciclovir  (FAMVIR ) 250 MG tablet, Take 1 tablet (250 mg total) by mouth daily., Disp: 30 tablet, Rfl: 6   furosemide (LASIX) 40 MG tablet, Take 40 mg by mouth daily., Disp: , Rfl:    latanoprost (XALATAN) 0.005 % ophthalmic solution, Place 1 drop into the right eye., Disp: , Rfl:    neomycin-polymyxin b-dexamethasone (MAXITROL) 3.5-10000-0.1 OINT, Place 1 Application into the right eye daily., Disp: , Rfl:    nitroGLYCERIN  (NITROSTAT ) 0.4 MG SL tablet, Place 1 tablet (0.4 mg total) under the tongue as needed., Disp: 25 tablet, Rfl: 3   pantoprazole  (PROTONIX ) 40 MG tablet, Take 1 tablet (40 mg total) by mouth 2 (two) times daily., Disp: 60 tablet, Rfl: 8   pravastatin  (PRAVACHOL ) 10 MG tablet, TAKE 1 TABLET BY  MOUTH DAILY, Disp: 90 tablet, Rfl: 3   predniSONE  (DELTASONE ) 20 MG tablet, Take 4 tablets (80 mg total) by mouth daily with breakfast. You MUST take this with food. (Patient taking differently: Take 20 mg by mouth daily with breakfast. You MUST take this with food.), Disp: 100 tablet, Rfl: 4   SYNTHROID  125 MCG tablet, Take 125 mcg by mouth daily., Disp: , Rfl:    traZODone (DESYREL) 50 MG tablet, Take 50 mg by mouth at bedtime., Disp: , Rfl:    vitamin B-12 (CYANOCOBALAMIN ) 1000 MCG tablet, Take 1,000 mcg by mouth daily., Disp: , Rfl:    VITAMIN D PO, Take 1 tablet by mouth daily., Disp: , Rfl:   Allergies:  Allergies  Allergen Reactions   Celecoxib     Other Reaction(s): Unknown    Past Medical History, Surgical history, Social history, and Family History were reviewed and updated.  Review of Systems: Review of Systems  Constitutional: Negative.   HENT:  Negative.    Eyes: Negative.   Respiratory: Negative.    Cardiovascular: Negative.   Gastrointestinal: Negative.   Endocrine: Negative.   Genitourinary: Negative.    Musculoskeletal: Negative.   Skin: Negative.   Neurological: Negative.   Hematological: Negative.   Psychiatric/Behavioral:  Positive for sleep disturbance.  Physical Exam:  height is 5' 10 (1.778 m) and weight is 182 lb 6.4 oz (82.7 kg). His oral temperature is 97.6 F (36.4 C). His blood pressure is 112/73 and his pulse is 51 (abnormal). His respiration is 20 and oxygen  saturation is 97%.   Wt Readings from Last 3 Encounters:  09/29/24 182 lb 6.4 oz (82.7 kg)  09/08/24 178 lb (80.7 kg)  09/06/24 168 lb (76.2 kg)    Physical Exam Vitals reviewed.  HENT:     Head: Normocephalic and atraumatic.  Eyes:     Pupils: Pupils are equal, round, and reactive to light.  Cardiovascular:     Rate and Rhythm: Normal rate and regular rhythm.     Heart sounds: Normal heart sounds.  Pulmonary:     Effort: Pulmonary effort is normal.     Breath sounds: Normal  breath sounds.  Abdominal:     General: Bowel sounds are normal.     Palpations: Abdomen is soft.  Musculoskeletal:        General: No tenderness or deformity. Normal range of motion.     Cervical back: Normal range of motion.  Lymphadenopathy:     Cervical: No cervical adenopathy.  Skin:    General: Skin is warm and dry.     Findings: No erythema or rash.  Neurological:     Mental Status: He is alert and oriented to person, place, and time.  Psychiatric:        Behavior: Behavior normal.        Thought Content: Thought content normal.        Judgment: Judgment normal.      Lab Results  Component Value Date   WBC 12.0 (H) 09/29/2024   HGB 11.7 (L) 09/29/2024   HCT 35.0 (L) 09/29/2024   MCV 107.4 (H) 09/29/2024   PLT 301 09/29/2024     Chemistry      Component Value Date/Time   NA 138 09/01/2024 0743   K 4.3 09/01/2024 0743   CL 97 (L) 09/01/2024 0743   CO2 31 09/01/2024 0743   BUN 26 (H) 09/01/2024 0743   CREATININE 1.19 09/01/2024 0743      Component Value Date/Time   CALCIUM  9.0 09/01/2024 0743   ALKPHOS 72 09/01/2024 0743   AST 18 09/01/2024 0743   ALT 30 09/01/2024 0743   BILITOT 1.1 09/01/2024 0743      Impression and Plan: [name] is a very nice 89 year old white male.  He certainly looks a lot younger.  He has ITP.  He has responded very nicely to prednisone .  We will go ahead and decrease his prednisone  to 10 mg a day.   We will plan to get him back in a month.  He will be having some company in for Thanksgiving.  His wife Holley is coming in from [place].  He has a brother coming in from [place] .  If all looks good when we see him back, we will then get him down to 5 mg a day.  I am confident that we should be able to continue to hold his platelet count where it is and keep it at a good level.     [name], MD 10/31/20258:14 AM

## 2024-10-02 DIAGNOSIS — I4891 Unspecified atrial fibrillation: Secondary | ICD-10-CM | POA: Diagnosis not present

## 2024-10-02 DIAGNOSIS — D696 Thrombocytopenia, unspecified: Secondary | ICD-10-CM | POA: Diagnosis not present

## 2024-10-02 DIAGNOSIS — Z7952 Long term (current) use of systemic steroids: Secondary | ICD-10-CM | POA: Diagnosis not present

## 2024-10-02 DIAGNOSIS — J90 Pleural effusion, not elsewhere classified: Secondary | ICD-10-CM | POA: Diagnosis not present

## 2024-10-02 DIAGNOSIS — G4700 Insomnia, unspecified: Secondary | ICD-10-CM | POA: Diagnosis not present

## 2024-10-02 DIAGNOSIS — E785 Hyperlipidemia, unspecified: Secondary | ICD-10-CM | POA: Diagnosis not present

## 2024-10-02 DIAGNOSIS — I517 Cardiomegaly: Secondary | ICD-10-CM | POA: Diagnosis not present

## 2024-10-02 DIAGNOSIS — R030 Elevated blood-pressure reading, without diagnosis of hypertension: Secondary | ICD-10-CM | POA: Diagnosis not present

## 2024-10-02 DIAGNOSIS — I2510 Atherosclerotic heart disease of native coronary artery without angina pectoris: Secondary | ICD-10-CM | POA: Diagnosis not present

## 2024-10-02 DIAGNOSIS — R0600 Dyspnea, unspecified: Secondary | ICD-10-CM | POA: Diagnosis not present

## 2024-10-02 DIAGNOSIS — I252 Old myocardial infarction: Secondary | ICD-10-CM | POA: Diagnosis not present

## 2024-10-02 DIAGNOSIS — R911 Solitary pulmonary nodule: Secondary | ICD-10-CM | POA: Diagnosis not present

## 2024-10-03 DIAGNOSIS — Z23 Encounter for immunization: Secondary | ICD-10-CM | POA: Diagnosis not present

## 2024-10-30 ENCOUNTER — Inpatient Hospital Stay: Admitting: Hematology & Oncology

## 2024-10-30 ENCOUNTER — Other Ambulatory Visit: Payer: Self-pay

## 2024-10-30 ENCOUNTER — Encounter: Payer: Self-pay | Admitting: Hematology & Oncology

## 2024-10-30 ENCOUNTER — Inpatient Hospital Stay: Attending: Hematology & Oncology

## 2024-10-30 VITALS — BP 120/58 | HR 50 | Temp 97.6°F | Resp 18 | Ht 70.0 in | Wt 179.0 lb

## 2024-10-30 DIAGNOSIS — D693 Immune thrombocytopenic purpura: Secondary | ICD-10-CM | POA: Diagnosis present

## 2024-10-30 DIAGNOSIS — Z7952 Long term (current) use of systemic steroids: Secondary | ICD-10-CM | POA: Diagnosis not present

## 2024-10-30 DIAGNOSIS — Z7901 Long term (current) use of anticoagulants: Secondary | ICD-10-CM | POA: Insufficient documentation

## 2024-10-30 LAB — CBC WITH DIFFERENTIAL (CANCER CENTER ONLY)
Abs Immature Granulocytes: 0.04 K/uL (ref 0.00–0.07)
Basophils Absolute: 0.1 K/uL (ref 0.0–0.1)
Basophils Relative: 1 %
Eosinophils Absolute: 0.2 K/uL (ref 0.0–0.5)
Eosinophils Relative: 1 %
HCT: 37.9 % — ABNORMAL LOW (ref 39.0–52.0)
Hemoglobin: 12.4 g/dL — ABNORMAL LOW (ref 13.0–17.0)
Immature Granulocytes: 0 %
Lymphocytes Relative: 19 %
Lymphs Abs: 2.4 K/uL (ref 0.7–4.0)
MCH: 36.2 pg — ABNORMAL HIGH (ref 26.0–34.0)
MCHC: 32.7 g/dL (ref 30.0–36.0)
MCV: 110.5 fL — ABNORMAL HIGH (ref 80.0–100.0)
Monocytes Absolute: 0.9 K/uL (ref 0.1–1.0)
Monocytes Relative: 7 %
Neutro Abs: 9.1 K/uL — ABNORMAL HIGH (ref 1.7–7.7)
Neutrophils Relative %: 72 %
Platelet Count: 388 K/uL (ref 150–400)
RBC: 3.43 MIL/uL — ABNORMAL LOW (ref 4.22–5.81)
RDW: 16 % — ABNORMAL HIGH (ref 11.5–15.5)
WBC Count: 12.6 K/uL — ABNORMAL HIGH (ref 4.0–10.5)
nRBC: 0 % (ref 0.0–0.2)

## 2024-10-30 LAB — CMP (CANCER CENTER ONLY)
ALT: 18 U/L (ref 0–44)
AST: 25 U/L (ref 15–41)
Albumin: 4 g/dL (ref 3.5–5.0)
Alkaline Phosphatase: 60 U/L (ref 38–126)
Anion gap: 8 (ref 5–15)
BUN: 19 mg/dL (ref 8–23)
CO2: 33 mmol/L — ABNORMAL HIGH (ref 22–32)
Calcium: 9.4 mg/dL (ref 8.9–10.3)
Chloride: 101 mmol/L (ref 98–111)
Creatinine: 1.17 mg/dL (ref 0.61–1.24)
GFR, Estimated: 58 mL/min — ABNORMAL LOW (ref >60)
Glucose, Bld: 111 mg/dL — ABNORMAL HIGH (ref 70–99)
Potassium: 4.5 mmol/L (ref 3.5–5.1)
Sodium: 142 mmol/L (ref 135–145)
Total Bilirubin: 1.1 mg/dL (ref 0.0–1.2)
Total Protein: 6.8 g/dL (ref 6.5–8.1)

## 2024-10-30 LAB — LACTATE DEHYDROGENASE: LDH: 177 U/L (ref 105–235)

## 2024-10-30 LAB — SAVE SMEAR(SSMR), FOR PROVIDER SLIDE REVIEW

## 2024-10-30 NOTE — Progress Notes (Signed)
 Hematology and Oncology Follow Up Visit  GIACOMO VALONE [id number] [birth year] 89 y.o. 10/30/2024   Principle Diagnosis:  Chronic ITP  Current Therapy:   Prednisone  10 mg p.o. daily-change to 5 mg p.o. daily on 10/30/2024     Interim History:  Mr. Cuffee is back for follow-up.  He had a very nice Thanksgiving.  His family came over.  He had a wonderful time.  He ate a lot of good food.  He is now on 10 mg a day of prednisone .  He is doing well with this.  He has had a couple small bruises.  He has had no bleeding.  He has had a little bit of leg swelling, more so with the right leg to the left leg.  He has had no fever.  He has had no rashes.  Has had no change in bowel or bladder habits.  He is on Eliquis.  He is post have cardioversion I think on December 15.  I told him that I do not see a problem with him having this as long as his platelets are fine.  Overall, I will say that his performance status is probably ECOG 1.     Medications:  Current Outpatient Medications:    apixaban (ELIQUIS) 5 MG TABS tablet, Take 5 mg by mouth 2 (two) times daily., Disp: , Rfl:    dorzolamide-timolol (COSOPT) 2-0.5 % ophthalmic solution, Place 1 drop into the right eye in the morning and at bedtime., Disp: , Rfl:    famciclovir  (FAMVIR ) 250 MG tablet, Take 1 tablet (250 mg total) by mouth daily., Disp: 30 tablet, Rfl: 6   furosemide (LASIX) 40 MG tablet, Take 40 mg by mouth daily., Disp: , Rfl:    latanoprost (XALATAN) 0.005 % ophthalmic solution, Place 1 drop into the right eye., Disp: , Rfl:    neomycin-polymyxin b-dexamethasone (MAXITROL) 3.5-10000-0.1 OINT, Place 1 Application into the right eye daily., Disp: , Rfl:    nitroGLYCERIN  (NITROSTAT ) 0.4 MG SL tablet, Place 1 tablet (0.4 mg total) under the tongue as needed., Disp: 25 tablet, Rfl: 3   pantoprazole  (PROTONIX ) 40 MG tablet, Take 1 tablet (40 mg total) by mouth 2 (two) times daily., Disp: 60 tablet, Rfl: 8   pravastatin   (PRAVACHOL ) 10 MG tablet, TAKE 1 TABLET BY MOUTH DAILY, Disp: 90 tablet, Rfl: 3   predniSONE  (DELTASONE ) 10 MG tablet, Take by mouth daily., Disp: , Rfl:    SYNTHROID  125 MCG tablet, Take 125 mcg by mouth daily., Disp: , Rfl:    traZODone (DESYREL) 50 MG tablet, Take 50 mg by mouth at bedtime., Disp: , Rfl:    vitamin B-12 (CYANOCOBALAMIN ) 1000 MCG tablet, Take 1,000 mcg by mouth daily., Disp: , Rfl:    VITAMIN D PO, Take 1 tablet by mouth daily., Disp: , Rfl:   Allergies:  Allergies  Allergen Reactions   Celecoxib     Other Reaction(s): Unknown    Past Medical History, Surgical history, Social history, and Family History were reviewed and updated.  Review of Systems: Review of Systems  Constitutional: Negative.   HENT:  Negative.    Eyes: Negative.   Respiratory: Negative.    Cardiovascular: Negative.   Gastrointestinal: Negative.   Endocrine: Negative.   Genitourinary: Negative.    Musculoskeletal: Negative.   Skin: Negative.   Neurological: Negative.   Hematological: Negative.   Psychiatric/Behavioral:  Positive for sleep disturbance.     Physical Exam:  Vital signs show temperature of 97.6.  Pulse 50.  Blood pressure 120/58.  Weight is 179 pounds.  Wt Readings from Last 3 Encounters:  09/29/24 182 lb 6.4 oz (82.7 kg)  09/08/24 178 lb (80.7 kg)  09/06/24 168 lb (76.2 kg)    Physical Exam Vitals reviewed.  HENT:     Head: Normocephalic and atraumatic.  Eyes:     Pupils: Pupils are equal, round, and reactive to light.  Cardiovascular:     Rate and Rhythm: Normal rate and regular rhythm.     Heart sounds: Normal heart sounds.  Pulmonary:     Effort: Pulmonary effort is normal.     Breath sounds: Normal breath sounds.  Abdominal:     General: Bowel sounds are normal.     Palpations: Abdomen is soft.  Musculoskeletal:        General: No tenderness or deformity. Normal range of motion.     Cervical back: Normal range of motion.  Lymphadenopathy:      Cervical: No cervical adenopathy.  Skin:    General: Skin is warm and dry.     Findings: No erythema or rash.  Neurological:     Mental Status: He is alert and oriented to person, place, and time.  Psychiatric:        Behavior: Behavior normal.        Thought Content: Thought content normal.        Judgment: Judgment normal.      Lab Results  Component Value Date   WBC 12.6 (H) 10/30/2024   HGB 12.4 (L) 10/30/2024   HCT 37.9 (L) 10/30/2024   MCV 110.5 (H) 10/30/2024   PLT 388 10/30/2024     Chemistry      Component Value Date/Time   NA 140 09/29/2024 0743   K 4.1 09/29/2024 0743   CL 99 09/29/2024 0743   CO2 30 09/29/2024 0743   BUN 31 (H) 09/29/2024 0743   CREATININE 1.31 (H) 09/29/2024 0743      Component Value Date/Time   CALCIUM  9.2 09/29/2024 0743   ALKPHOS 68 09/29/2024 0743   AST 20 09/29/2024 0743   ALT 23 09/29/2024 0743   BILITOT 1.2 09/29/2024 0743      Impression and Plan: Mr. Stmarie is a very nice 89 year old white male.  He certainly looks a lot younger.  He has ITP.  He has responded very nicely to prednisone .  We will now decrease his prednisone  down to 5 mg a day.  Once we see him back, then hopefully we can get him off the prednisone .  He and his wife are going to [place].  They plan on going for New Year's.  I am sure that they will have a wonderful time.  We will go ahead and plan to get him back after his trip to [place].   [name], MD 12/1/20258:13 AM

## 2024-11-13 ENCOUNTER — Encounter (HOSPITAL_COMMUNITY): Payer: Self-pay | Admitting: Internal Medicine

## 2024-11-13 ENCOUNTER — Ambulatory Visit (HOSPITAL_COMMUNITY)
Admission: RE | Admit: 2024-11-13 | Discharge: 2024-11-13 | Disposition: A | Source: Ambulatory Visit | Attending: Internal Medicine | Admitting: Internal Medicine

## 2024-11-13 VITALS — BP 118/74 | HR 57 | Ht 70.0 in | Wt 179.4 lb

## 2024-11-13 DIAGNOSIS — I4819 Other persistent atrial fibrillation: Secondary | ICD-10-CM | POA: Insufficient documentation

## 2024-11-13 DIAGNOSIS — D6869 Other thrombophilia: Secondary | ICD-10-CM

## 2024-11-13 NOTE — H&P (View-Only) (Signed)
 Primary Care Physician: Onita Rush, MD Primary Cardiologist: Peter Jordan, MD Electrophysiologist: None     Referring Physician: Sammi Laymon Braver, NP     Joseph Roman is a 89 y.o. male with a history of CAD, MI 04/2008 s/p stent, s/p VATS due to left pleural effusion 12/2016 secondary to CAP, dyslipidemia, and atrial fibrillation who presents for consultation in the [facility]. Patient seen by PCP on 06/22/24 for pneumonia not improving and found to be in rate controlled Afib. CT chest on 06/13/24 showed small bilateral pleural effusions and multifocal pneumonia. Patient is on Eliquis 5 mg BID for a CHADS2VASC score of 3.  On evaluation today, he is currently in Afib. He feels tired and SOB currently. He does note to feel improvement overall since pneumonia diagnosis. He stopped ASA and began Eliquis 5 mg BID on 7/25.   On follow up 07/24/24, patient is in Afib. He restarted Eliquis on 8/21 after oncology visit and took two doses on 8/22. He is not sleeping well since starting prednisone .   On follow up 09/08/24, patient is currently in Afib. He notes prednisone  has been lowered since last bloodwork. He is taking prednisone  20 mg daily. He notes it is negatively affecting his sleep. It will sometimes lead to affecting his gait. No missed doses of Eliquis since evening of 9/29. It was held due to eye procedure.  Follow-up 11/13/2024, patient is currently in A-fib.  He has been able to reduce his prednisone  therapy down to 5 mg daily from previous.  He is now getting a full night sleep without issue.  He has not missed any doses of Eliquis.  He is interested in performing the cardioversion to try to restore sinus rhythm.  Patient is noted that he is going out of town to [place] to visit his wife's family in several days.   Today, he denies symptoms of palpitations, chest pain, orthopnea, PND, lower extremity edema, dizziness, presyncope, syncope,  snoring, daytime somnolence, bleeding, or neurologic sequela. The patient is tolerating medications without difficulties and is otherwise without complaint today.   he has a BMI of Body mass index is 25.74 kg/m.SABRA Filed Weights   11/13/24 0852  Weight: 81.4 kg     Current Outpatient Medications  Medication Sig Dispense Refill   apixaban (ELIQUIS) 5 MG TABS tablet Take 5 mg by mouth 2 (two) times daily.     dorzolamide-timolol (COSOPT) 2-0.5 % ophthalmic solution Place 1 drop into the right eye in the morning and at bedtime.     famciclovir  (FAMVIR ) 250 MG tablet Take 1 tablet (250 mg total) by mouth daily. 30 tablet 6   furosemide (LASIX) 40 MG tablet Take 40 mg by mouth daily.     latanoprost (XALATAN) 0.005 % ophthalmic solution Place 1 drop into the right eye.     neomycin-polymyxin b-dexamethasone (MAXITROL) 3.5-10000-0.1 OINT Place 1 Application into the right eye daily.     nitroGLYCERIN  (NITROSTAT ) 0.4 MG SL tablet Place 1 tablet (0.4 mg total) under the tongue as needed. 25 tablet 3   pantoprazole  (PROTONIX ) 40 MG tablet Take 1 tablet (40 mg total) by mouth 2 (two) times daily. 60 tablet 8   pravastatin  (PRAVACHOL ) 10 MG tablet TAKE 1 TABLET BY MOUTH DAILY 90 tablet 3   predniSONE  (DELTASONE ) 10 MG tablet Take by mouth daily. (Patient taking differently: Take 5 mg by mouth daily.)     SYNTHROID  125 MCG tablet Take 125 mcg by mouth daily.  traZODone (DESYREL) 50 MG tablet Take 50 mg by mouth at bedtime.     vitamin B-12 (CYANOCOBALAMIN ) 1000 MCG tablet Take 1,000 mcg by mouth daily.     VITAMIN D PO Take 1 tablet by mouth daily.     No current facility-administered medications for this encounter.    Atrial Fibrillation Management history:  Previous antiarrhythmic drugs: none Previous cardioversions: none Previous ablations: none Anticoagulation history: Eliquis   ROS- All systems are reviewed and negative except as per the HPI above.  Physical Exam: BP 118/74    Pulse (!) 57   Ht 5' 10 (1.778 m)   Wt 81.4 kg   BMI 25.74 kg/m   GEN- The patient is well appearing, alert and oriented x 3 today.   Neck - no JVD or carotid bruit noted Lungs- Clear to ausculation bilaterally, normal work of breathing Heart- Irregular rate and rhythm, no murmurs, rubs or gallops, PMI not laterally displaced Extremities- no clubbing, cyanosis, or edema Skin - no rash or ecchymosis noted    EKG today demonstrates  EKG Interpretation Date/Time:  Monday November 13 2024 08:55:58 EST Ventricular Rate:  57 PR Interval:    QRS Duration:  80 QT Interval:  404 QTC Calculation: 393 R Axis:   58  Text Interpretation: Atrial fibrillation with slow ventricular response Abnormal ECG When compared with ECG of 08-Sep-2024 09:56, No significant change was found Confirmed by Terra Pac ([id number]) on 11/13/2024 9:09:25 AM     Echo 07/21/24: 1. Left ventricular ejection fraction, by estimation, is 55 to 60%. The  left ventricle has normal function. The left ventricle has no regional  wall motion abnormalities. Left ventricular diastolic parameters are  indeterminate.   2. Right ventricular systolic function is normal. The right ventricular  size is normal. There is normal pulmonary artery systolic pressure.   3. Left atrial size was mildly dilated.   4. Right atrial size was mildly dilated.   5. The mitral valve is grossly normal. Mild mitral valve regurgitation.  Moderate mitral annular calcification.   6. The aortic valve is tricuspid. Aortic valve regurgitation is not  visualized.   7. The inferior vena cava is dilated in size with <50% respiratory  variability, suggesting right atrial pressure of 15 mmHg.    ASSESSMENT & PLAN CHA2DS2-VASc Score = 3  The patient's score is based upon: CHF History: 0 HTN History: 0 Diabetes History: 0 Stroke History: 0 Vascular Disease History: 1 Age Score: 2 Gender Score: 0       ASSESSMENT AND PLAN: Persistent Atrial  Fibrillation (ICD10:  I48.19) The patient's CHA2DS2-VASc score is 3, indicating a 3.2% annual risk of stroke.    Patient is currently in A-fib.  He has reduced his prednisone  therapy.  He is now taking prednisone  5 mg daily.  He has not missed any doses of Eliquis.  We discussed cardioversion as a procedure to try to restore sinus rhythm.  We discussed whether performing it now, before he goes on his trip out of the country, versus after the holidays.  After discussion, patient elects to trial cardioversion now.  We could potentially consider, if he has early return of A-fib, another cardioversion in the future if he successfully comes off the prednisone  altogether.  Labs drawn on 12/1. We discussed the procedure cardioversion to try to convert to NSR. We discussed the risks vs benefits of this procedure and how ultimately we cannot predict whether a patient will have early return of arrhythmia post procedure. After  discussion, the patient wishes to proceed with cardioversion.  Informed Consent   Shared Decision Making/Informed Consent The risks (stroke, cardiac arrhythmias rarely resulting in the need for a temporary or permanent pacemaker, skin irritation or burns and complications associated with conscious sedation including aspiration, arrhythmia, respiratory failure and death), benefits (restoration of normal sinus rhythm) and alternatives of a direct current cardioversion were explained in detail to Mr. Lizarraga and he agrees to proceed.        Secondary Hypercoagulable State (ICD10:  D68.69) The patient is at significant risk for stroke/thromboembolism based upon his CHA2DS2-VASc Score of 3.  Continue Apixaban (Eliquis).  No missed doses.     Follow-up 2 to 3 weeks after DCCV.   Terra Pac, PA-C  [facility] [address] [place], [place] [address] [phone]

## 2024-11-13 NOTE — Patient Instructions (Signed)
 Cardioversion scheduled for: 11/15/24 Wednesday 8:00 am    - Arrive at the [facility] ([address]  and check in with ADMITTING at 8:00 am    - Do not eat or drink anything after midnight the night prior to your procedure.   - Take all your morning medication (except diabetic medications) with a sip of water prior to arrival.  - Do NOT miss any doses of your blood thinner - if you should miss a dose or take a dose more than 4 hours late -- please notify our office immediately.  - You will not be able to drive home after your procedure. Please ensure you have a responsible adult to drive you home. You will need someone with you for 24 hours post procedure.     - Expect to be in the procedural area approximately 2 hours.   - If you feel as if you go back into normal rhythm prior to scheduled cardioversion, please notify our office immediately.   If your procedure is canceled in the cardioversion suite you will be charged a cancellation fee.

## 2024-11-13 NOTE — Addendum Note (Signed)
 Encounter addended by: Janel Nancy SAUNDERS, RN on: 11/13/2024 9:52 AM  Actions taken: Order list changed, Diagnosis association updated

## 2024-11-13 NOTE — Progress Notes (Signed)
 Primary Care Physician: Onita Rush, MD Primary Cardiologist: Peter Jordan, MD Electrophysiologist: None     Referring Physician: Sammi Laymon Braver, NP     Joseph Roman is a 89 y.o. male with a history of CAD, MI 04/2008 s/p stent, s/p VATS due to left pleural effusion 12/2016 secondary to CAP, dyslipidemia, and atrial fibrillation who presents for consultation in the [facility]. Patient seen by PCP on 06/22/24 for pneumonia not improving and found to be in rate controlled Afib. CT chest on 06/13/24 showed small bilateral pleural effusions and multifocal pneumonia. Patient is on Eliquis 5 mg BID for a CHADS2VASC score of 3.  On evaluation today, he is currently in Afib. He feels tired and SOB currently. He does note to feel improvement overall since pneumonia diagnosis. He stopped ASA and began Eliquis 5 mg BID on 7/25.   On follow up 07/24/24, patient is in Afib. He restarted Eliquis on 8/21 after oncology visit and took two doses on 8/22. He is not sleeping well since starting prednisone .   On follow up 09/08/24, patient is currently in Afib. He notes prednisone  has been lowered since last bloodwork. He is taking prednisone  20 mg daily. He notes it is negatively affecting his sleep. It will sometimes lead to affecting his gait. No missed doses of Eliquis since evening of 9/29. It was held due to eye procedure.  Follow-up 11/13/2024, patient is currently in A-fib.  He has been able to reduce his prednisone  therapy down to 5 mg daily from previous.  He is now getting a full night sleep without issue.  He has not missed any doses of Eliquis.  He is interested in performing the cardioversion to try to restore sinus rhythm.  Patient is noted that he is going out of town to [place] to visit his wife's family in several days.   Today, he denies symptoms of palpitations, chest pain, orthopnea, PND, lower extremity edema, dizziness, presyncope, syncope,  snoring, daytime somnolence, bleeding, or neurologic sequela. The patient is tolerating medications without difficulties and is otherwise without complaint today.   he has a BMI of Body mass index is 25.74 kg/m.SABRA Filed Weights   11/13/24 0852  Weight: 81.4 kg     Current Outpatient Medications  Medication Sig Dispense Refill   apixaban (ELIQUIS) 5 MG TABS tablet Take 5 mg by mouth 2 (two) times daily.     dorzolamide-timolol (COSOPT) 2-0.5 % ophthalmic solution Place 1 drop into the right eye in the morning and at bedtime.     famciclovir  (FAMVIR ) 250 MG tablet Take 1 tablet (250 mg total) by mouth daily. 30 tablet 6   furosemide (LASIX) 40 MG tablet Take 40 mg by mouth daily.     latanoprost (XALATAN) 0.005 % ophthalmic solution Place 1 drop into the right eye.     neomycin-polymyxin b-dexamethasone (MAXITROL) 3.5-10000-0.1 OINT Place 1 Application into the right eye daily.     nitroGLYCERIN  (NITROSTAT ) 0.4 MG SL tablet Place 1 tablet (0.4 mg total) under the tongue as needed. 25 tablet 3   pantoprazole  (PROTONIX ) 40 MG tablet Take 1 tablet (40 mg total) by mouth 2 (two) times daily. 60 tablet 8   pravastatin  (PRAVACHOL ) 10 MG tablet TAKE 1 TABLET BY MOUTH DAILY 90 tablet 3   predniSONE  (DELTASONE ) 10 MG tablet Take by mouth daily. (Patient taking differently: Take 5 mg by mouth daily.)     SYNTHROID  125 MCG tablet Take 125 mcg by mouth daily.  traZODone (DESYREL) 50 MG tablet Take 50 mg by mouth at bedtime.     vitamin B-12 (CYANOCOBALAMIN ) 1000 MCG tablet Take 1,000 mcg by mouth daily.     VITAMIN D PO Take 1 tablet by mouth daily.     No current facility-administered medications for this encounter.    Atrial Fibrillation Management history:  Previous antiarrhythmic drugs: none Previous cardioversions: none Previous ablations: none Anticoagulation history: Eliquis   ROS- All systems are reviewed and negative except as per the HPI above.  Physical Exam: BP 118/74    Pulse (!) 57   Ht 5' 10 (1.778 m)   Wt 81.4 kg   BMI 25.74 kg/m   GEN- The patient is well appearing, alert and oriented x 3 today.   Neck - no JVD or carotid bruit noted Lungs- Clear to ausculation bilaterally, normal work of breathing Heart- Irregular rate and rhythm, no murmurs, rubs or gallops, PMI not laterally displaced Extremities- no clubbing, cyanosis, or edema Skin - no rash or ecchymosis noted    EKG today demonstrates  EKG Interpretation Date/Time:  Monday November 13 2024 08:55:58 EST Ventricular Rate:  57 PR Interval:    QRS Duration:  80 QT Interval:  404 QTC Calculation: 393 R Axis:   58  Text Interpretation: Atrial fibrillation with slow ventricular response Abnormal ECG When compared with ECG of 08-Sep-2024 09:56, No significant change was found Confirmed by Terra Pac ([id number]) on 11/13/2024 9:09:25 AM     Echo 07/21/24: 1. Left ventricular ejection fraction, by estimation, is 55 to 60%. The  left ventricle has normal function. The left ventricle has no regional  wall motion abnormalities. Left ventricular diastolic parameters are  indeterminate.   2. Right ventricular systolic function is normal. The right ventricular  size is normal. There is normal pulmonary artery systolic pressure.   3. Left atrial size was mildly dilated.   4. Right atrial size was mildly dilated.   5. The mitral valve is grossly normal. Mild mitral valve regurgitation.  Moderate mitral annular calcification.   6. The aortic valve is tricuspid. Aortic valve regurgitation is not  visualized.   7. The inferior vena cava is dilated in size with <50% respiratory  variability, suggesting right atrial pressure of 15 mmHg.    ASSESSMENT & PLAN CHA2DS2-VASc Score = 3  The patient's score is based upon: CHF History: 0 HTN History: 0 Diabetes History: 0 Stroke History: 0 Vascular Disease History: 1 Age Score: 2 Gender Score: 0       ASSESSMENT AND PLAN: Persistent Atrial  Fibrillation (ICD10:  I48.19) The patient's CHA2DS2-VASc score is 3, indicating a 3.2% annual risk of stroke.    Patient is currently in A-fib.  He has reduced his prednisone  therapy.  He is now taking prednisone  5 mg daily.  He has not missed any doses of Eliquis.  We discussed cardioversion as a procedure to try to restore sinus rhythm.  We discussed whether performing it now, before he goes on his trip out of the country, versus after the holidays.  After discussion, patient elects to trial cardioversion now.  We could potentially consider, if he has early return of A-fib, another cardioversion in the future if he successfully comes off the prednisone  altogether.  Labs drawn on 12/1. We discussed the procedure cardioversion to try to convert to NSR. We discussed the risks vs benefits of this procedure and how ultimately we cannot predict whether a patient will have early return of arrhythmia post procedure. After  discussion, the patient wishes to proceed with cardioversion.  Informed Consent   Shared Decision Making/Informed Consent The risks (stroke, cardiac arrhythmias rarely resulting in the need for a temporary or permanent pacemaker, skin irritation or burns and complications associated with conscious sedation including aspiration, arrhythmia, respiratory failure and death), benefits (restoration of normal sinus rhythm) and alternatives of a direct current cardioversion were explained in detail to Mr. Lizarraga and he agrees to proceed.        Secondary Hypercoagulable State (ICD10:  D68.69) The patient is at significant risk for stroke/thromboembolism based upon his CHA2DS2-VASc Score of 3.  Continue Apixaban (Eliquis).  No missed doses.     Follow-up 2 to 3 weeks after DCCV.   Terra Pac, PA-C  [facility] [address] [place], [place] [address] [phone]

## 2024-11-14 DIAGNOSIS — H04123 Dry eye syndrome of bilateral lacrimal glands: Secondary | ICD-10-CM | POA: Diagnosis not present

## 2024-11-14 DIAGNOSIS — H401431 Capsular glaucoma with pseudoexfoliation of lens, bilateral, mild stage: Secondary | ICD-10-CM | POA: Diagnosis not present

## 2024-11-15 ENCOUNTER — Ambulatory Visit (HOSPITAL_COMMUNITY)
Admission: RE | Admit: 2024-11-15 | Discharge: 2024-11-15 | Disposition: A | Discharge status: Home / Self Care | Attending: Cardiology | Admitting: Cardiology

## 2024-11-15 ENCOUNTER — Encounter (HOSPITAL_COMMUNITY): Payer: Self-pay | Admitting: Cardiology

## 2024-11-15 ENCOUNTER — Ambulatory Visit (HOSPITAL_COMMUNITY): Admitting: Anesthesiology

## 2024-11-15 ENCOUNTER — Other Ambulatory Visit: Payer: Self-pay

## 2024-11-15 ENCOUNTER — Encounter (HOSPITAL_COMMUNITY): Admission: RE | Disposition: A | Payer: Self-pay | Attending: Cardiology

## 2024-11-15 DIAGNOSIS — Z955 Presence of coronary angioplasty implant and graft: Secondary | ICD-10-CM | POA: Diagnosis not present

## 2024-11-15 DIAGNOSIS — I4891 Unspecified atrial fibrillation: Secondary | ICD-10-CM | POA: Diagnosis not present

## 2024-11-15 DIAGNOSIS — D6869 Other thrombophilia: Secondary | ICD-10-CM | POA: Diagnosis not present

## 2024-11-15 DIAGNOSIS — Z79899 Other long term (current) drug therapy: Secondary | ICD-10-CM | POA: Insufficient documentation

## 2024-11-15 DIAGNOSIS — I2510 Atherosclerotic heart disease of native coronary artery without angina pectoris: Secondary | ICD-10-CM

## 2024-11-15 DIAGNOSIS — I4819 Other persistent atrial fibrillation: Secondary | ICD-10-CM | POA: Insufficient documentation

## 2024-11-15 DIAGNOSIS — Z7952 Long term (current) use of systemic steroids: Secondary | ICD-10-CM | POA: Diagnosis not present

## 2024-11-15 DIAGNOSIS — I252 Old myocardial infarction: Secondary | ICD-10-CM | POA: Diagnosis not present

## 2024-11-15 DIAGNOSIS — Z7901 Long term (current) use of anticoagulants: Secondary | ICD-10-CM | POA: Insufficient documentation

## 2024-11-15 DIAGNOSIS — E785 Hyperlipidemia, unspecified: Secondary | ICD-10-CM | POA: Diagnosis not present

## 2024-11-15 DIAGNOSIS — Z006 Encounter for examination for normal comparison and control in clinical research program: Secondary | ICD-10-CM

## 2024-11-15 HISTORY — PX: CARDIOVERSION: EP1203

## 2024-11-15 SURGERY — CARDIOVERSION (CATH LAB)
Anesthesia: General

## 2024-11-15 MED ORDER — LIDOCAINE 2% (20 MG/ML) 5 ML SYRINGE
INTRAMUSCULAR | Status: DC | PRN
  Administered 2024-11-15: 09:00:00 60 mg via INTRAVENOUS

## 2024-11-15 MED ORDER — SODIUM CHLORIDE 0.9 % IV SOLN: INTRAVENOUS | Status: DC

## 2024-11-15 MED ORDER — PROPOFOL 10 MG/ML IV BOLUS
INTRAVENOUS | Status: DC | PRN
  Administered 2024-11-15: 09:00:00 60 mg via INTRAVENOUS

## 2024-11-15 SURGICAL SUPPLY — 1 items: PAD DEFIB RADIO PHYSIO CONN (PAD) ×1 IMPLANT

## 2024-11-15 NOTE — Anesthesia Postprocedure Evaluation (Signed)
 Anesthesia Post Note  Patient: Joseph Roman  Procedure(s) Performed: CARDIOVERSION     Patient location during evaluation: PACU Anesthesia Type: General Level of consciousness: awake and alert Pain management: pain level controlled Vital Signs Assessment: post-procedure vital signs reviewed and stable Respiratory status: spontaneous breathing, nonlabored ventilation, respiratory function stable and patient connected to nasal cannula oxygen  Cardiovascular status: blood pressure returned to baseline and stable Postop Assessment: no apparent nausea or vomiting Anesthetic complications: no   No notable events documented.  Last Vitals:  Vitals:   11/15/24 0910 11/15/24 0920  BP:    Pulse: 63 (!) 46  Resp: 17 19  Temp:    SpO2: 98% 97%    Last Pain:  Vitals:   11/15/24 0753  TempSrc:   PainSc: 0-No pain                 Cordella SQUIBB Shelbi Vaccaro

## 2024-11-15 NOTE — Transfer of Care (Signed)
 Immediate Anesthesia Transfer of Care Note  Patient: Joseph Roman  Procedure(s) Performed: CARDIOVERSION  Patient Location: PACU  Anesthesia Type:General  Level of Consciousness: drowsy  Airway & Oxygen  Therapy: Patient Spontanous Breathing and Patient connected to nasal cannula oxygen   Post-op Assessment: Report given to RN and Post -op Vital signs reviewed and stable  Post vital signs: Reviewed and stable  Last Vitals:  Vitals Value Taken Time  BP    Temp    Pulse    Resp    SpO2      Last Pain:  Vitals:   11/15/24 0753  TempSrc:   PainSc: 0-No pain         Complications: No notable events documented.

## 2024-11-15 NOTE — Research (Signed)
 [facility] Cardioversion Informed Consent   Subject Name: Joseph Roman  Subject met inclusion and exclusion criteria.  The informed consent form, study requirements and expectations were reviewed with the subject and questions and concerns were addressed prior to the signing of the consent form.  The subject verbalized understanding of the trial requirements.  The subject agreed to participate in the [facility] Cardioversion trial and signed the informed consent at 0835 on 17/Dec/2025.  The informed consent was obtained prior to performance of any protocol-specific procedures for the subject.  A copy of the signed informed consent was given to the subject and a copy was placed in the subject's medical record.   Rosaline BIRCH Daton Szilagyi

## 2024-11-15 NOTE — CV Procedure (Signed)
° °  Electrical Cardioversion Procedure Note Joseph Roman [id number] 08-08-1930  Procedure: Electrical Cardioversion Indications:  Atrial Fibrillation  Time Out: Verified patient identification, verified procedure,medications/allergies/relevent history reviewed, required imaging and test results available.  Performed  Procedure Details  The patient signed informed consent.   The patient was NPO past midnight. Has had therapeutic anticoagulation with Eliquis greater than 3 weeks. The patient denies any interruption of anticoagulation.  Anesthesia was administered by Dr. Stoltzfus.  Adequate airway was maintained throughout and vital followed per protocol.  He was cardioverted x 2 with first 200J and second 300J of biphasic synchronized energy.  He converted to NSR.  There were no apparent complications.  The patient tolerated the procedure well and had normal neuro status and respiratory status post procedure with vitals stable as recorded elsewhere.     IMPRESSION:  Successful cardioversion of atrial fibrillation   Follow up:  We will arrange follow up with Primary cardiologist.  He will continue on current medical therapy.  The patient advised to continue anticoagulation.  Vence Lalor 11/15/2024, 9:06 AM

## 2024-11-15 NOTE — Interval H&P Note (Signed)
 History and Physical Interval Note:  11/15/2024 8:20 AM  Joseph Roman  has presented today for surgery, with the diagnosis of AFIB.  The various methods of treatment have been discussed with the patient and family. After consideration of risks, benefits and other options for treatment, the patient has consented to  Procedures: CARDIOVERSION (N/A) as a surgical intervention.  The patient's history has been reviewed, patient examined, no change in status, stable for surgery.  I have reviewed the patient's chart and labs.  Questions were answered to the patient's satisfaction.     Hagan Maltz

## 2024-11-15 NOTE — Discharge Instructions (Signed)
 Electrical Cardioversion Electrical cardioversion is the delivery of a jolt of electricity to restore a normal rhythm to the heart. A rhythm that is too fast or is not regular keeps the heart from pumping well. In this procedure, sticky patches or metal paddles are placed on the chest to deliver electricity to the heart from a device. This procedure may be done in an emergency if: There is low or no blood pressure as a result of the heart rhythm. Normal rhythm must be restored as fast as possible to protect the brain and heart from further damage. It may save a life. This may also be a scheduled procedure for irregular or fast heart rhythms that are not immediately life-threatening.  What can I expect after the procedure? Your blood pressure, heart rate, breathing rate, and blood oxygen level will be monitored until you leave the hospital or clinic. Your heart rhythm will be watched to make sure it does not change. You may have some redness on the skin where the shocks were given. Over the counter cortizone cream may be helpful.  Follow these instructions at home: Do not drive for 24 hours if you were given a sedative during your procedure. Take over-the-counter and prescription medicines only as told by your health care provider. Ask your health care provider how to check your pulse. Check it often. Rest for 48 hours after the procedure or as told by your health care provider. Avoid or limit your caffeine use as told by your health care provider. Keep all follow-up visits as told by your health care provider. This is important. Contact a health care provider if: You feel like your heart is beating too quickly or your pulse is not regular. You have a serious muscle cramp that does not go away. Get help right away if: You have discomfort in your chest. You are dizzy or you feel faint. You have trouble breathing or you are short of breath. Your speech is slurred. You have trouble moving an  arm or leg on one side of your body. Your fingers or toes turn cold or blue. Summary Electrical cardioversion is the delivery of a jolt of electricity to restore a normal rhythm to the heart. This procedure may be done right away in an emergency or may be a scheduled procedure if the condition is not an emergency. Generally, this is a safe procedure. After the procedure, check your pulse often as told by your health care provider. This information is not intended to replace advice given to you by your health care provider. Make sure you discuss any questions you have with your health care provider. Document Revised: 06/19/2019 Document Reviewed: 06/19/2019 Elsevier Patient EducatiElectrical Cardioversion Electrical cardioversion is the delivery of a jolt of electricity to restore a normal rhythm to the heart. A rhythm that is too fast or is not regular (arrhythmia) keeps the heart from pumping blood well. There is also another type of cardioversion called a chemical (pharmacologic) cardioversion. This is when your health care provider gives you one or more medicines to bring back your regular heart rhythm. Electrical cardioversion is done as a scheduled procedure for arrhythmiasthat are not life-threatening. Electrical cardioversion may also be done in an emergency for sudden life-threatening arrhythmias. Tell a health care provider about: Any allergies you have. All medicines you are taking, including vitamins, herbs, eye drops, creams, and over-the-counter medicines. Any problems you or family members have had with sedatives or anesthesia. Any bleeding problems you have. Any surgeries you  have had, including a pacemaker, defibrillator, or other implanted device. Any medical conditions you have. Whether you are pregnant or may be pregnant. What are the risks? Your provider will talk with you about risks. These include: Allergic reactions to medicines. Irritation to the skin on your chest or  back where the sticky pads (electrodes) or paddles were put during electrical cardioversion. A blood clot that breaks free and travels to other parts of your body, such as your brain. Return of a worse abnormal heart rhythm that will need to be treated with medicines, a pacemaker, or an implantable cardioverter defibrillator (ICD). What happens before the procedure? Medicines Your provider may give you: Blood-thinning medicines (anticoagulants) so your blood does not clot as easily. If your provider gives you this medicine, you may need to take it for 4 weeks before the procedure. Medicines to help stabilize your heart rate and rhythm. Ask your provider about: Changing or stopping your regular medicines. These include any diabetes medicines or blood thinners you take. Taking medicines such as aspirin and ibuprofen. These medicines can thin your blood. Do not take them unless your provider tells you to. Taking over-the-counter medicines, vitamins, herbs, and supplements. General instructions Follow instructions from your provider about what you may eat and drink. Do not put any lotions, powders, or ointments on your chest and back for 24 hours before the procedure. They can cause problems with the electrodes or paddles used to deliver electricity to your heart. Do not wear jewelry as this can interfere with delivering electricity to your heart. If you will be going home right after the procedure, plan to have a responsible adult: Take you home from the hospital or clinic. You will not be allowed to drive. Care for you for the time you are told. Tests You may have an exam or testing. This may include: Blood labs. A transesophageal echocardiogram (TEE). What happens during the procedure?     An IV will be inserted into one of your veins. You will be given a sedative. This helps you relax. Electrodes or metal paddles will be placed on your chest. They may be placed in one of these ways: One  placed on your right chest, the other on the left ribs. One placed on your chest and the other on your back. An electrical shock will be delivered. The shock briefly stops (resets) your heart rhythm. Your provider will check to see if your heart rhythm is now normal. Some people need only one shock. Some need more to restore a normal heart rhythm. The procedure may vary among providers and hospitals. What happens after the procedure? Your blood pressure, heart rate, breathing rate, and blood oxygen level will be monitored until you leave the hospital or clinic. Your heart rhythm will be watched to make sure it does not change. This information is not intended to replace advice given to you by your health care provider. Make sure you discuss any questions you have with your health care provider. Document Revised: 07/09/2022 Document Reviewed: 07/09/2022 Elsevier Patient Education  2024 Elsevier Inc[employer]on  2020 [employer].

## 2024-11-15 NOTE — Anesthesia Preprocedure Evaluation (Addendum)
 Anesthesia Evaluation  Patient identified by MRN, date of birth, ID band Patient awake    Reviewed: Allergy & Precautions, NPO status , Patient's Chart, lab work & pertinent test results  Airway Mallampati: II  TM Distance: >3 FB Neck ROM: Full    Dental no notable dental hx.    Pulmonary neg pulmonary ROS   Pulmonary exam normal        Cardiovascular + CAD and + Past MI   Rhythm:Irregular Rate:Normal     Neuro/Psych negative neurological ROS  negative psych ROS   GI/Hepatic Neg liver ROS,GERD  Medicated,,  Endo/Other  Hypothyroidism    Renal/GU negative Renal ROS  negative genitourinary   Musculoskeletal  (+) Arthritis , Osteoarthritis,    Abdominal Normal abdominal exam  (+)   Peds  Hematology  (+) Blood dyscrasia, anemia   Anesthesia Other Findings   Reproductive/Obstetrics                              Anesthesia Physical Anesthesia Plan  ASA: 3  Anesthesia Plan: General   Post-op Pain Management:    Induction: Intravenous  PONV Risk Score and Plan: 2 and Treatment may vary due to age or medical condition  Airway Management Planned: Mask  Additional Equipment: None  Intra-op Plan:   Post-operative Plan:   Informed Consent: I have reviewed the patients History and Physical, chart, labs and discussed the procedure including the risks, benefits and alternatives for the proposed anesthesia with the patient or authorized representative who has indicated his/her understanding and acceptance.     Dental advisory given  Plan Discussed with: CRNA  Anesthesia Plan Comments:         Anesthesia Quick Evaluation

## 2024-12-11 ENCOUNTER — Ambulatory Visit (HOSPITAL_COMMUNITY): Admitting: Internal Medicine

## 2024-12-12 ENCOUNTER — Inpatient Hospital Stay

## 2024-12-12 ENCOUNTER — Inpatient Hospital Stay: Admitting: Hematology & Oncology

## 2024-12-14 ENCOUNTER — Inpatient Hospital Stay

## 2024-12-14 ENCOUNTER — Inpatient Hospital Stay: Admitting: Hematology & Oncology

## 2024-12-19 ENCOUNTER — Encounter (HOSPITAL_COMMUNITY): Payer: Self-pay | Admitting: Physician Assistant

## 2024-12-19 ENCOUNTER — Ambulatory Visit (HOSPITAL_COMMUNITY)
Admission: RE | Admit: 2024-12-19 | Discharge: 2024-12-19 | Disposition: A | Discharge status: Home / Self Care | Source: Ambulatory Visit | Attending: Internal Medicine | Admitting: Internal Medicine

## 2024-12-19 VITALS — BP 136/84 | HR 60 | Ht 70.5 in | Wt 181.6 lb

## 2024-12-19 DIAGNOSIS — I4819 Other persistent atrial fibrillation: Secondary | ICD-10-CM | POA: Diagnosis present

## 2024-12-19 DIAGNOSIS — D6869 Other thrombophilia: Secondary | ICD-10-CM | POA: Insufficient documentation

## 2024-12-19 NOTE — Progress Notes (Signed)
 "   Primary Care Physician: Onita Rush, MD Primary Cardiologist: Peter Jordan, MD Electrophysiologist: None  Referring Physician: Sammi Laymon Braver, NP     Joseph Roman is a 89 y.o. male with a history of CAD, MI 04/2008 s/p stent, s/p VATS due to left pleural effusion 12/2016 secondary to CAP, dyslipidemia, and atrial fibrillation who presents for follow up in the [facility]. Patient seen by PCP on 06/22/24 for pneumonia not improving and found to be in rate controlled Afib. CT chest on 06/13/24 showed small bilateral pleural effusions and multifocal pneumonia. Patient is on Eliquis 5 mg BID for a CHADS2VASC score of 3.  On evaluation today, he is currently in Afib. He feels tired and SOB currently. He does note to feel improvement overall since pneumonia diagnosis. He stopped ASA and began Eliquis 5 mg BID on 7/25.   On follow up 07/24/24, patient is in Afib. He restarted Eliquis on 8/21 after oncology visit and took two doses on 8/22. He is not sleeping well since starting prednisone .   On follow up 09/08/24, patient is currently in Afib. He notes prednisone  has been lowered since last bloodwork. He is taking prednisone  20 mg daily. He notes it is negatively affecting his sleep. It will sometimes lead to affecting his gait. No missed doses of Eliquis since evening of 9/29. It was held due to eye procedure.  Follow-up 11/13/2024, patient is currently in A-fib.  He has been able to reduce his prednisone  therapy down to 5 mg daily from previous.  He is now getting a full night sleep without issue.  He has not missed any doses of Eliquis.  He is interested in performing the cardioversion to try to restore sinus rhythm.  Patient is noted that he is going out of town to [place] to visit his wife's family in several days.   Follow up 12/19/24. Patient returns for follow up for atrial fibrillation. Patient is s/p DCCV on 11/15/24. He remains in SR today and feels  well. He has not noted any afib on his Kardia mobile since DCCV. No bleeding issues on anticoagulation.   Today, he  denies symptoms of palpitations, chest pain, shortness of breath, orthopnea, PND, lower extremity edema, dizziness, presyncope, syncope, snoring, daytime somnolence, bleeding, or neurologic sequela. The patient is tolerating medications without difficulties and is otherwise without complaint today.    he has a BMI of Body mass index is 25.69 kg/m.SABRA Filed Weights   12/19/24 0925  Weight: 82.4 kg     Current Outpatient Medications  Medication Sig Dispense Refill   apixaban (ELIQUIS) 5 MG TABS tablet Take 5 mg by mouth 2 (two) times daily.     dorzolamide-timolol (COSOPT) 2-0.5 % ophthalmic solution Place 1 drop into the right eye in the morning and at bedtime.     famciclovir  (FAMVIR ) 250 MG tablet Take 1 tablet (250 mg total) by mouth daily. 30 tablet 6   furosemide (LASIX) 40 MG tablet Take 40 mg by mouth daily.     latanoprost (XALATAN) 0.005 % ophthalmic solution Place 1 drop into the right eye.     nitroGLYCERIN  (NITROSTAT ) 0.4 MG SL tablet Place 1 tablet (0.4 mg total) under the tongue as needed. 25 tablet 3   pantoprazole  (PROTONIX ) 40 MG tablet Take 1 tablet (40 mg total) by mouth 2 (two) times daily. 60 tablet 8   pravastatin  (PRAVACHOL ) 10 MG tablet TAKE 1 TABLET BY MOUTH DAILY 90 tablet 3   predniSONE  (  DELTASONE ) 10 MG tablet Take by mouth daily. (Patient taking differently: Take 5 mg by mouth daily.)     SYNTHROID  125 MCG tablet Take 125 mcg by mouth daily.     traZODone (DESYREL) 50 MG tablet Take 50 mg by mouth at bedtime.     vitamin B-12 (CYANOCOBALAMIN ) 1000 MCG tablet Take 1,000 mcg by mouth daily.     VITAMIN D PO Take 1 tablet by mouth daily.     No current facility-administered medications for this encounter.    Atrial Fibrillation Management history:  Previous antiarrhythmic drugs: none Previous cardioversions: 11/15/24 Previous ablations:  none Anticoagulation history: Eliquis   ROS- All systems are reviewed and negative except as per the HPI above.  Physical Exam: BP 136/84   Pulse 60   Ht 5' 10.5 (1.791 m)   Wt 82.4 kg   BMI 25.69 kg/m    GEN: Well nourished, well developed in no acute distress CARDIAC: Regular rate and rhythm, no murmurs, rubs, gallops RESPIRATORY:  Clear to auscultation without rales, wheezing or rhonchi  ABDOMEN: Soft, non-tender, non-distended EXTREMITIES:  No edema; No deformity    EKG Interpretation Date/Time:  Tuesday December 19 2024 09:27:56 EST Ventricular Rate:  60 PR Interval:  186 QRS Duration:  72 QT Interval:  404 QTC Calculation: 404 R Axis:   70  Text Interpretation: Normal sinus rhythm Normal ECG When compared with ECG of 15-Nov-2024 09:06, No significant change was found Confirmed by Shakim Faith ([id number]) on 12/19/2024 9:41:41 AM     Echo 07/21/24: 1. Left ventricular ejection fraction, by estimation, is 55 to 60%. The  left ventricle has normal function. The left ventricle has no regional  wall motion abnormalities. Left ventricular diastolic parameters are  indeterminate.   2. Right ventricular systolic function is normal. The right ventricular  size is normal. There is normal pulmonary artery systolic pressure.   3. Left atrial size was mildly dilated.   4. Right atrial size was mildly dilated.   5. The mitral valve is grossly normal. Mild mitral valve regurgitation.  Moderate mitral annular calcification.   6. The aortic valve is tricuspid. Aortic valve regurgitation is not  visualized.   7. The inferior vena cava is dilated in size with <50% respiratory  variability, suggesting right atrial pressure of 15 mmHg.     CHA2DS2-VASc Score = 3  The patient's score is based upon: CHF History: 0 HTN History: 0 Diabetes History: 0 Stroke History: 0 Vascular Disease History: 1 Age Score: 2 Gender Score: 0       ASSESSMENT AND PLAN: Persistent Atrial  Fibrillation (ICD10:  I48.19) The patient's CHA2DS2-VASc score is 3, indicating a 3.2% annual risk of stroke.   S/p DCCV 11/15/24 Patient appears to be maintaining SR Continue Eliquis 5 mg BID  Secondary Hypercoagulable State (ICD10:  D68.69) The patient is at significant risk for stroke/thromboembolism based upon his CHA2DS2-VASc Score of 3.  Continue Apixaban (Eliquis). No bleeding issues.   CAD No anginal symptoms Followed by Dr Jordan   Follow-up in the AF clinic in 3 months with Thom Heinrich.    Daril Kicks PA-C Af[facility] [address] [place], [place] [address] [phone]  "

## 2024-12-22 ENCOUNTER — Inpatient Hospital Stay: Admitting: Hematology & Oncology

## 2024-12-22 ENCOUNTER — Inpatient Hospital Stay: Attending: Hematology & Oncology

## 2024-12-22 ENCOUNTER — Encounter: Payer: Self-pay | Admitting: Hematology & Oncology

## 2024-12-22 VITALS — BP 133/75 | HR 63 | Temp 98.2°F | Resp 17 | Ht 70.0 in | Wt 176.0 lb

## 2024-12-22 DIAGNOSIS — Z7952 Long term (current) use of systemic steroids: Secondary | ICD-10-CM | POA: Diagnosis not present

## 2024-12-22 DIAGNOSIS — D693 Immune thrombocytopenic purpura: Secondary | ICD-10-CM | POA: Diagnosis not present

## 2024-12-22 DIAGNOSIS — Z7901 Long term (current) use of anticoagulants: Secondary | ICD-10-CM | POA: Diagnosis not present

## 2024-12-22 DIAGNOSIS — I4891 Unspecified atrial fibrillation: Secondary | ICD-10-CM | POA: Diagnosis not present

## 2024-12-22 LAB — CBC WITH DIFFERENTIAL (CANCER CENTER ONLY)
Abs Immature Granulocytes: 0.07 K/uL (ref 0.00–0.07)
Basophils Absolute: 0 K/uL (ref 0.0–0.1)
Basophils Relative: 0 %
Eosinophils Absolute: 0 K/uL (ref 0.0–0.5)
Eosinophils Relative: 0 %
HCT: 36.9 % — ABNORMAL LOW (ref 39.0–52.0)
Hemoglobin: 12.3 g/dL — ABNORMAL LOW (ref 13.0–17.0)
Immature Granulocytes: 1 %
Lymphocytes Relative: 13 %
Lymphs Abs: 1.4 K/uL (ref 0.7–4.0)
MCH: 35.3 pg — ABNORMAL HIGH (ref 26.0–34.0)
MCHC: 33.3 g/dL (ref 30.0–36.0)
MCV: 106 fL — ABNORMAL HIGH (ref 80.0–100.0)
Monocytes Absolute: 0.5 K/uL (ref 0.1–1.0)
Monocytes Relative: 5 %
Neutro Abs: 8.8 K/uL — ABNORMAL HIGH (ref 1.7–7.7)
Neutrophils Relative %: 81 %
Platelet Count: 422 K/uL — ABNORMAL HIGH (ref 150–400)
RBC: 3.48 MIL/uL — ABNORMAL LOW (ref 4.22–5.81)
RDW: 13.5 % (ref 11.5–15.5)
WBC Count: 10.8 K/uL — ABNORMAL HIGH (ref 4.0–10.5)
nRBC: 0 % (ref 0.0–0.2)

## 2024-12-22 LAB — LACTATE DEHYDROGENASE: LDH: 177 U/L (ref 105–235)

## 2024-12-22 LAB — CMP (CANCER CENTER ONLY)
ALT: 14 U/L (ref 0–44)
AST: 16 U/L (ref 15–41)
Albumin: 4.2 g/dL (ref 3.5–5.0)
Alkaline Phosphatase: 67 U/L (ref 38–126)
Anion gap: 9 (ref 5–15)
BUN: 18 mg/dL (ref 8–23)
CO2: 31 mmol/L (ref 22–32)
Calcium: 9.3 mg/dL (ref 8.9–10.3)
Chloride: 103 mmol/L (ref 98–111)
Creatinine: 1.12 mg/dL (ref 0.61–1.24)
GFR, Estimated: >60 mL/min
Glucose, Bld: 122 mg/dL — ABNORMAL HIGH (ref 70–99)
Potassium: 4.3 mmol/L (ref 3.5–5.1)
Sodium: 142 mmol/L (ref 135–145)
Total Bilirubin: 0.5 mg/dL (ref 0.0–1.2)
Total Protein: 6.7 g/dL (ref 6.5–8.1)

## 2024-12-22 LAB — SAVE SMEAR(SSMR), FOR PROVIDER SLIDE REVIEW

## 2024-12-22 NOTE — Progress Notes (Signed)
 " Hematology and Oncology Follow Up Visit  Joseph Roman [id number] Oct 07, 1930 89 y.o. 12/22/2024   Principle Diagnosis:  Chronic ITP  Current Therapy:   Prednisone  10 mg p.o. daily-change to 5 mg p.o. daily on 10/30/2024 --DC on 12/22/2024     Interim History:  Joseph Roman is back for follow-up.  He is looking great.  He is feeling good.  He had no problems over the Holiday season.  Think we can now get him off prednisone .  He is on 5 mg a day.  His platelet count is 422,000.  As such, I just do not think that we have to keep him on prednisone .  He is happy about this.  With this, I think we can get him off the Famvir  and also the Protonix .  He has had no problems with bleeding or bruising.  He has had no cough or shortness of breath.  There is been no fever.  He has had no issues with COVID or Influenza.  He continues on Eliquis.  He had atrial fibrillation that has been cardioverted.  He has had no change in bowel or bladder habits.  Overall, I would say that his performance status is probably ECOG 0.     Medications:  Current Outpatient Medications:    apixaban (ELIQUIS) 5 MG TABS tablet, Take 5 mg by mouth 2 (two) times daily., Disp: , Rfl:    dorzolamide-timolol (COSOPT) 2-0.5 % ophthalmic solution, Place 1 drop into the right eye in the morning and at bedtime., Disp: , Rfl:    famciclovir  (FAMVIR ) 250 MG tablet, Take 1 tablet (250 mg total) by mouth daily., Disp: 30 tablet, Rfl: 6   furosemide (LASIX) 40 MG tablet, Take 40 mg by mouth daily., Disp: , Rfl:    latanoprost (XALATAN) 0.005 % ophthalmic solution, Place 1 drop into the right eye., Disp: , Rfl:    nitroGLYCERIN  (NITROSTAT ) 0.4 MG SL tablet, Place 1 tablet (0.4 mg total) under the tongue as needed., Disp: 25 tablet, Rfl: 3   pantoprazole  (PROTONIX ) 40 MG tablet, Take 1 tablet (40 mg total) by mouth 2 (two) times daily., Disp: 60 tablet, Rfl: 8   pravastatin  (PRAVACHOL ) 10 MG tablet, TAKE 1 TABLET BY MOUTH  DAILY, Disp: 90 tablet, Rfl: 3   predniSONE  (DELTASONE ) 10 MG tablet, Take by mouth daily. (Patient taking differently: Take 5 mg by mouth daily.), Disp: , Rfl:    SYNTHROID  125 MCG tablet, Take 125 mcg by mouth daily., Disp: , Rfl:    traZODone (DESYREL) 50 MG tablet, Take 50 mg by mouth at bedtime., Disp: , Rfl:    vitamin B-12 (CYANOCOBALAMIN ) 1000 MCG tablet, Take 1,000 mcg by mouth daily., Disp: , Rfl:    VITAMIN D PO, Take 1 tablet by mouth daily., Disp: , Rfl:   Allergies:  Allergies  Allergen Reactions   Celecoxib     Other Reaction(s): Unknown    Past Medical History, Surgical history, Social history, and Family History were reviewed and updated.  Review of Systems: Review of Systems  Constitutional: Negative.   HENT:  Negative.    Eyes: Negative.   Respiratory: Negative.    Cardiovascular: Negative.   Gastrointestinal: Negative.   Endocrine: Negative.   Genitourinary: Negative.    Musculoskeletal: Negative.   Skin: Negative.   Neurological: Negative.   Hematological: Negative.   Psychiatric/Behavioral:  Positive for sleep disturbance.     Physical Exam:  Vital signs show temperature of 98.2.  Pulse 63.  Blood pressure  133/75.  Weight is 176 pounds.    Wt Readings from Last 3 Encounters:  12/22/24 176 lb (79.8 kg)  12/19/24 181 lb 9.6 oz (82.4 kg)  11/15/24 170 lb (77.1 kg)    Physical Exam Vitals reviewed.  HENT:     Head: Normocephalic and atraumatic.  Eyes:     Pupils: Pupils are equal, round, and reactive to light.  Cardiovascular:     Rate and Rhythm: Normal rate and regular rhythm.     Heart sounds: Normal heart sounds.  Pulmonary:     Effort: Pulmonary effort is normal.     Breath sounds: Normal breath sounds.  Abdominal:     General: Bowel sounds are normal.     Palpations: Abdomen is soft.  Musculoskeletal:        General: No tenderness or deformity. Normal range of motion.     Cervical back: Normal range of motion.  Lymphadenopathy:      Cervical: No cervical adenopathy.  Skin:    General: Skin is warm and dry.     Findings: No erythema or rash.  Neurological:     Mental Status: He is alert and oriented to person, place, and time.  Psychiatric:        Behavior: Behavior normal.        Thought Content: Thought content normal.        Judgment: Judgment normal.      Lab Results  Component Value Date   WBC 10.8 (H) 12/22/2024   HGB 12.3 (L) 12/22/2024   HCT 36.9 (L) 12/22/2024   MCV 106.0 (H) 12/22/2024   PLT 422 (H) 12/22/2024     Chemistry      Component Value Date/Time   NA 142 10/30/2024 0745   K 4.5 10/30/2024 0745   CL 101 10/30/2024 0745   CO2 33 (H) 10/30/2024 0745   BUN 19 10/30/2024 0745   CREATININE 1.17 10/30/2024 0745      Component Value Date/Time   CALCIUM  9.4 10/30/2024 0745   ALKPHOS 60 10/30/2024 0745   AST 25 10/30/2024 0745   ALT 18 10/30/2024 0745   BILITOT 1.1 10/30/2024 0745      Impression and Plan: Joseph Roman is a very nice 89 year old white male.  He certainly looks a lot younger.  He has ITP.  He has responded very nicely to prednisone .  Again, we will stop the prednisone .  We will follow him along at this point.  We will plan to get him back in about 3 months now.  I am glad that they had a wonderful time up in [place].  Bev's birthday is coming up next week.  I know this will be of very one-time for him.   [name], MD 1/23/20263:20 PM "

## 2025-01-22 ENCOUNTER — Ambulatory Visit: Admitting: Cardiology

## 2025-03-05 ENCOUNTER — Inpatient Hospital Stay

## 2025-03-05 ENCOUNTER — Inpatient Hospital Stay: Admitting: Hematology & Oncology

## 2025-06-25 ENCOUNTER — Ambulatory Visit (HOSPITAL_COMMUNITY): Admitting: Internal Medicine
